# Patient Record
Sex: Male | Born: 1970 | Hispanic: Refuse to answer | Marital: Married
Health system: Midwestern US, Community
[De-identification: ages and names within clinical notes are randomized; demographics above are authoritative.]

## PROBLEM LIST (undated history)

## (undated) DIAGNOSIS — E785 Hyperlipidemia, unspecified: Secondary | ICD-10-CM

## (undated) DIAGNOSIS — F32A Depression, unspecified: Secondary | ICD-10-CM

## (undated) DIAGNOSIS — I10 Essential (primary) hypertension: Principal | ICD-10-CM

## (undated) DIAGNOSIS — E669 Obesity, unspecified: Secondary | ICD-10-CM

## (undated) DIAGNOSIS — I499 Cardiac arrhythmia, unspecified: Secondary | ICD-10-CM

## (undated) DIAGNOSIS — E119 Type 2 diabetes mellitus without complications: Secondary | ICD-10-CM

## (undated) DIAGNOSIS — E1159 Type 2 diabetes mellitus with other circulatory complications: Secondary | ICD-10-CM

## (undated) DIAGNOSIS — F419 Anxiety disorder, unspecified: Secondary | ICD-10-CM

## (undated) DIAGNOSIS — G4730 Sleep apnea, unspecified: Secondary | ICD-10-CM

## (undated) DIAGNOSIS — E1169 Type 2 diabetes mellitus with other specified complication: Secondary | ICD-10-CM

## (undated) DIAGNOSIS — N529 Male erectile dysfunction, unspecified: Secondary | ICD-10-CM

## (undated) DIAGNOSIS — M25511 Pain in right shoulder: Secondary | ICD-10-CM

## (undated) DIAGNOSIS — G8929 Other chronic pain: Secondary | ICD-10-CM

## (undated) HISTORY — DX: Male erectile dysfunction, unspecified: N52.9

## (undated) HISTORY — DX: Hyperlipidemia, unspecified: E78.5

## (undated) HISTORY — DX: Other chronic pain: G89.29

## (undated) HISTORY — DX: Type 2 diabetes mellitus with other circulatory complications: E11.59

## (undated) HISTORY — DX: Type 2 diabetes mellitus without complications: E11.9

## (undated) HISTORY — DX: Essential (primary) hypertension: I10

## (undated) HISTORY — PX: VASECTOMY: SHX75

## (undated) HISTORY — DX: Type 2 diabetes mellitus with other specified complication: E11.69

## (undated) HISTORY — DX: Obesity, unspecified: E66.9

## (undated) HISTORY — DX: Pain in right shoulder: M25.511

---
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
## 2014-06-23 DIAGNOSIS — K429 Umbilical hernia without obstruction or gangrene: Secondary | ICD-10-CM | POA: Insufficient documentation

## 2014-10-20 HISTORY — PX: HERNIA REPAIR: SHX51

## 2015-12-06 ENCOUNTER — Ambulatory Visit: Admit: 2015-12-06 | Discharge: 2015-12-06 | Payer: PRIVATE HEALTH INSURANCE | Attending: Family Medicine

## 2015-12-06 DIAGNOSIS — E785 Hyperlipidemia, unspecified: Secondary | ICD-10-CM

## 2015-12-06 MED ORDER — TADALAFIL 10 MG TABLET
10 mg | ORAL_TABLET | Freq: Every day | ORAL | 3 refills | Status: AC | PRN
Start: 2015-12-06 — End: ?

## 2015-12-06 MED ORDER — SIMVASTATIN 20 MG TAB
20 mg | ORAL_TABLET | ORAL | 3 refills | Status: AC
Start: 2015-12-06 — End: ?

## 2015-12-06 MED ORDER — METFORMIN 500 MG TAB
500 mg | ORAL_TABLET | Freq: Two times a day (BID) | ORAL | 3 refills | Status: AC
Start: 2015-12-06 — End: ?

## 2015-12-06 MED ORDER — VICTOZA 3-PAK 0.6 MG/0.1 ML (18 MG/3 ML) SUBCUTANEOUS PEN INJECTOR
0.6 mg/0.1 mL (18 mg/3 mL) | INJECTION | Freq: Every evening | SUBCUTANEOUS | 3 refills | Status: AC
Start: 2015-12-06 — End: ?

## 2015-12-06 NOTE — Patient Instructions (Signed)
Vaccine Information Statement    Influenza (Flu) Vaccine (Inactivated or Recombinant): What you need to know    Many Vaccine Information Statements are available in Spanish and other languages. See www.immunize.org/vis  Hojas de Informaci??n Sobre Vacunas est??n disponibles en Espa??ol y en muchos otros idiomas. Visite www.immunize.org/vis    1. Why get vaccinated?    Influenza (???flu???) is a contagious disease that spreads around the [place] every year, usually between October and May.     Flu is caused by influenza viruses, and is spread mainly by coughing, sneezing, and close contact.     Anyone can get flu. Flu strikes suddenly and can last several days. Symptoms vary by age, but can include:  ??? fever/chills  ??? sore throat  ??? muscle aches  ??? fatigue  ??? cough  ??? headache   ??? runny or stuffy nose    Flu can also lead to pneumonia and blood infections, and cause diarrhea and seizures in children.  If you have a medical condition, such as heart or lung disease, flu can make it worse.    Flu is more dangerous for some people. Infants and young children, people 65 years of age and older, pregnant women, and people with certain health conditions or a weakened immune system are at greatest risk.      Each year thousands of people in the [place] die from flu, and many more are hospitalized.     Flu vaccine can:  ??? keep you from getting flu,  ??? make flu less severe if you do get it, and  ??? keep you from spreading flu to your family and other people.     2. Inactivated and recombinant flu vaccines    A dose of flu vaccine is recommended every flu season. Children 6 months through 8 years of age may need two doses during the same flu season.  Everyone else needs only one dose each flu season.       Some inactivated flu vaccines contain a very small amount of a mercury-based preservative called thimerosal. Studies have not shown thimerosal in vaccines to be harmful, but flu vaccines that do not contain  thimerosal are available.    There is no live flu virus in flu shots.  They cannot cause the flu.     There are many flu viruses, and they are always changing. Each year a new flu vaccine is made to protect against three or four viruses that are likely to cause disease in the upcoming flu season. But even when the vaccine doesn???t exactly match these viruses, it may still provide some protection    Flu vaccine cannot prevent:  ??? flu that is caused by a virus not covered by the vaccine, or  ??? illnesses that look like flu but are not.    It takes about 2 weeks for protection to develop after vaccination, and protection lasts through the flu season.     3. Some people should not get this vaccine    Tell the person who is giving you the vaccine:    ??? If you have any severe, life-threatening allergies.    If you ever had a life-threatening allergic reaction after a dose of flu vaccine, or have a severe allergy to any part of this vaccine, you may be advised not to get vaccinated.  Most, but not all, types of flu vaccine contain a small amount of egg protein.       ??? If you   ever had Guillain-Barr?? Syndrome (also called GBS).   Some people with a history of GBS should not get this vaccine. This should be discussed with your doctor.    ??? If you are not feeling well.    It is usually okay to get flu vaccine when you have a mild illness, but you might be asked to come back when you feel better.      4. Risks of a vaccine reaction    With any medicine, including vaccines, there is a chance of reactions. These are usually mild and go away on their own, but serious reactions are also possible.     Most people who get a flu shot do not have any problems with it.     Minor problems following a flu shot include:   ??? soreness, redness, or swelling where the shot was given    ??? hoarseness  ??? sore, red or itchy eyes  ??? cough  ??? fever  ??? aches  ??? headache  ??? itching  ??? fatigue   If these problems occur, they usually begin soon after the shot and last 1 or 2 days.     More serious problems following a flu shot can include the following:    ??? There may be a small increased risk of Guillain-Barr?? Syndrome (GBS) after inactivated flu vaccine.  This risk has been estimated at 1 or 2 additional cases per million people vaccinated. This is much lower than the risk of severe complications from flu, which can be prevented by flu vaccine.      ??? Young children who get the flu shot along with pneumococcal vaccine (PCV13) and/or DTaP vaccine at the same time might be slightly more likely to have a seizure caused by fever. Ask your doctor for more information. Tell your doctor if a child who is getting flu vaccine has ever had a seizure.     Problems that could happen after any injected vaccine:     ??? People sometimes faint after a medical procedure, including vaccination. Sitting or lying down for about 15 minutes can help prevent fainting, and injuries caused by a fall. Tell your doctor if you feel dizzy, or have vision changes or ringing in the ears.    ??? Some people get severe pain in the shoulder and have difficulty moving the arm where a shot was given. This happens very rarely.    ??? Any medication can cause a severe allergic reaction. Such reactions from a vaccine are very rare, estimated at about 1 in a million doses, and would happen within a few minutes to a few hours after the vaccination.    As with any medicine, there is a very remote chance of a vaccine causing a serious injury or death.    The safety of vaccines is always being monitored. For more information, visit: www.cdc.gov/vaccinesafety/    5. What if there is a serious reaction?    What should I look for?    ??? Look for anything that concerns you, such as signs of a severe allergic reaction, very high fever, or unusual behavior.    Signs of a severe allergic reaction can include hives, swelling of the  face and throat, difficulty breathing, a fast heartbeat, dizziness, and weakness ??? usually within a few minutes to a few hours after the vaccination.    What should I do?    ??? If you think it is a severe allergic reaction or other emergency that   can???t wait, call 9-1-1 and get the person to the nearest hospital. Otherwise, call your doctor.    ??? Reactions should be reported to the Vaccine Adverse Event Reporting System (VAERS). Your doctor should file this report, or you can do it yourself through  the VAERS web site at www.vaers.hhs.gov, or by calling 1-800-822-7967.    VAERS does not give medical advice.    6. The National Vaccine Injury Compensation Program    The National Vaccine Injury Compensation Program (VICP) is a federal program that was created to compensate people who may have been injured by certain vaccines.    Persons who believe they may have been injured by a vaccine can learn about the program and about filing a claim by calling 1-800-338-2382 or visiting the VICP website at www.hrsa.gov/vaccinecompensation.  There is a time limit to file a claim for compensation.    7. How can I learn more?  ??? Ask your healthcare provider. He or she can give you the vaccine package insert or suggest other sources of information.  ??? Call your local or state health department.  ??? Contact the Centers for Disease Control and Prevention (CDC):  - Call 1-800-232-4636 (1-800-CDC-INFO) or  - Visit CDC???s website at www.cdc.gov/flu    Vaccine Information Statement   Inactivated Influenza Vaccine   05/26/2014  42 U.S.C[place] ?? [id number]    Department of Health and Human Services  Centers for Disease Control and Prevention    Office Use Only

## 2015-12-06 NOTE — Progress Notes (Signed)
Tyrone Thomas is a 45 y.o. male who presents for routine immunizations.   He denies any symptoms , reactions or allergies that would exclude them from being immunized today.  Risks and adverse reactions were discussed and the VIS was given to them. All questions were addressed.  He was observed for 10 min post injection. There were no reactions observed.    [name]

## 2015-12-06 NOTE — Progress Notes (Signed)
SUBJECTIVE:  Tyrone Thomas is a 45 y.o. year old male   Chief Complaint   Patient presents with   ??? Blood sugar problem   ??? Cholesterol Problem   ??? Elevated Blood Pressure       History of Present Illness:   He has been treated with Metformin for dysmetabolic syndrome for last 10 years.  He is on VICTOZA for last 2-3 years.  His last BW was 7-8 months ago and his HgbA1c was normal.  He has lost weight with current treatment.    He is also taking Cialis for ED for the last 3 years.    He has had high blood pressure in the past, which improved with weight loss.    Past Medical History   Diagnosis Date   ??? H/O: HTN (hypertension) 2007   ??? High triglycerides    ??? Hypercholesterolemia    ??? Metabolic syndrome 2012     Past Surgical History   Procedure Laterality Date   ??? Hx hernia repair  2016   ??? Hx tonsillectomy  1975        Current Outpatient Prescriptions   Medication Sig   ??? VICTOZA 3-PAK 0.6 mg/0.1 mL (18 mg/3 mL) sub-q pen 1.8 mg by SubCUTAneous route nightly.   ??? metFORMIN (GLUCOPHAGE) 500 mg tablet Take 1 Tab by mouth two (2) times a day.   ??? simvastatin (ZOCOR) 20 mg tablet TAKE 1 TABLET BY MOUTH EVERY NIGHT AT BEDTIMES   ??? CIALIS 5 mg tablet TAKE 1 TABLET BY MOUTH DAILY   ??? aspirin delayed-release 81 mg tablet Take 81 mg by mouth daily.   ??? cholecalciferol (VITAMIN D3) 1,000 unit cap Take 1,000 Units by mouth daily.   ??? OMEGA-3 FATTY ACIDS/FISH OIL (OMEGA 3 FISH OIL PO) Take 2,000 mg by mouth daily.     No current facility-administered medications for this visit.        No Known Allergies     Family History   Problem Relation Age of Onset   ??? Diabetes Mother    ??? Thyroid Disease Father    ??? No Known Problems Brother         Social History   Substance Use Topics   ??? Smoking status: Never Smoker   ??? Smokeless tobacco: Former Neurosurgeon     Quit date: 10/20/1993   ??? Alcohol use 3.0 oz/week     3 Glasses of wine, 2 Cans of beer per week       Review of Systems:      Constitutional: No fever, chills, night sweats, malaise, dizziness.   Eyes: Glasses.  No eye discomfort, discharge, redness, new visual changes.   Ear/Nose/Throat: No ear/ throat/ sinus pain, lesions, unusual discharge, new speaking or hearing problems, epistaxis.   Cardiovascular: No angina, palpitations, PND, orthopnea, lightheadedness, edema, claudication.  Respiratory: No dyspnea, wheeze, pleurisy, hemoptysis, unusual cough or sputum.   Gastrointestinal: No nausea/ vomiting, bowel habit change, pain, GER symptoms, melena, hematochezia, anorexia.  Genitourinary: Denies any comlaints  Musculoskeletal: No joint swelling/pain, instability, focal weakness, stiffness/rigidity, radicular pain.  Skin/Breast: Denies any complaints.  Neurological: No seizures, numbness, dizziness, speech abnormality, incontinence.  Psychiatric: No agitation, confusion/disorientation, suicidal or homicidal ideation.   Endocrine: Denies any other complaints.  Heme/Onc/Lympha: Denies any complaints.  Allergy/Immunol: Denies any complaints.                   OBJECTIVE:  Physical Exam:   Constitutional: General Appearance:  well developed, obese,  nontoxic, in no acute distress.   Visit Vitals   ??? BP 138/88 (BP 1 Location: Left arm, BP Patient Position: Sitting)   ??? Pulse 92   ??? Temp 98 ??F (36.7 ??C) (Oral)   ??? Resp 16   ??? Ht  (1.753 m)   ??? Wt 241 lb (109.3 kg)   ??? SpO2 97%   ??? BMI 35.59 kg/m2     Eyes: Conjunctiva: normal & Lids: normal.  Pupils & Irises: normal size; equal, round and reactive to light.      ENT/Mouth: Nose: clear.  Throat:  clear.    Neck Area: Neck: without masses, symmetric, trachea is midline.  Thyroid:  without significant enlargement, masses or tenderness.    Pulmonary: Respiratory effort: normal; no dyspnea, no retractions, no accessory muscle use.   Auscultation: normal & symmetrical air exchange; no rales, no rhonchi, no wheeze; no rubs    Cardiovascular: Palpation: PMI not displaced or enlarged, no thrills or  heaves.   Auscultation: RRR; no murmur, rubs or gallops.   Extremities: no edema, no active varicosity. Neck: carotid arteries- normal volume & without bruit; no JVD. Femoral arteries: no bruit. Pedal pulses: normal volume.    Gastrointestinal: Normal bowel sounds.  No masses; no tenderness; no rebound/rigidity; no CVA tenderness.  No hepatosplenomegaly.  Well healed umbilical hernia scar.       Skin: Inspection: no significant rashes, lesions or ulcers.      Nodes: Cervical: no significant adenopathy.  Inguinal: no significant adenopathy.    Psychiatric: Oriented to time, place and person.   Normal mood, no agitation or anxiety.  Normal affect.  Pleasant and cooperative.    Neurologic: CN I to XII: intact.  DTR: symmetrical & normal.    Musculoskeletal: NC/AT.  Neck-supple. Gait and Station: gait- normal; station- normal.  All Extremities: no heat, effusion, redness or tenderness; normal strength/tone; no instability; FROM.         ASSESSMENT:     1. Hyperlipidemia, unspecified hyperlipidemia type    2. Elevated glucose    3. Elevated blood pressure    4. Obesity (BMI 35.0-39.9 without comorbidity) (HCC)    5. Encounter for immunization        PLAN:     Orders Placed This Encounter   ??? Influenza virus vaccine (QUADRIVALENT PRES FREE SYRINGE) IM 3 years and older   ??? LIPID PANEL   ??? CBC WITH AUTOMATED DIFF   ??? METABOLIC PANEL, COMPREHENSIVE   ??? URINALYSIS W/ RFLX MICROSCOPIC   ??? TSH 3RD GENERATION   ??? T4, FREE   ??? HEMOGLOBIN A1C WITH EAG   ??? VICTOZA 3-PAK 0.6 mg/0.1 mL (18 mg/3 mL) sub-q pen   ??? metFORMIN (GLUCOPHAGE) 500 mg tablet   ??? simvastatin (ZOCOR) 20 mg tablet   ??? tadalafil (CIALIS) 10 mg tablet    To return fasting and call for results.    Pharmacologic Management: Medications reviewed with the patient.  Change CIALIS to 10 PRN instead 5 mg daily.      Other Instructions: Discussed DDx, follow-up & work-up.  Discussed risk/benefit & side effect of treatment.  Follow up visit as planned, prn  sooner.  Low salt ADA diet, weightloss & graduated excecise.  Health risk from non adherence discussed.  Patient voiced understanding.     Follow-up Disposition:  Return in about 3 months (around 03/04/2016).    Ovid Curd, MD

## 2015-12-07 ENCOUNTER — Inpatient Hospital Stay: Admit: 2015-12-07 | Payer: PRIVATE HEALTH INSURANCE

## 2015-12-07 DIAGNOSIS — E785 Hyperlipidemia, unspecified: Secondary | ICD-10-CM

## 2015-12-07 LAB — CBC WITH AUTOMATED DIFF
ABS. BASOPHILS: 0.1 10*3/uL — ABNORMAL HIGH (ref 0.0–0.06)
ABS. EOSINOPHILS: 0.1 10*3/uL (ref 0.0–0.4)
ABS. LYMPHOCYTES: 2.2 10*3/uL (ref 0.9–3.6)
ABS. MONOCYTES: 0.7 10*3/uL (ref 0.05–1.2)
ABS. NEUTROPHILS: 6.7 10*3/uL (ref 1.8–8.0)
BASOPHILS: 1 % (ref 0–2)
EOSINOPHILS: 1 % (ref 0–5)
HCT: 47.4 % (ref 36.0–48.0)
HGB: 15.7 g/dL (ref 13.0–16.0)
LYMPHOCYTES: 22 % (ref 21–52)
MCH: 31.7 PG (ref 24.0–34.0)
MCHC: 33.1 g/dL (ref 31.0–37.0)
MCV: 95.6 FL (ref 74.0–97.0)
MONOCYTES: 7 % (ref 3–10)
MPV: 11.1 FL (ref 9.2–11.8)
NEUTROPHILS: 69 % (ref 40–73)
PLATELET: 235 10*3/uL (ref 135–420)
RBC: 4.96 M/uL (ref 4.70–5.50)
RDW: 12.7 % (ref 11.6–14.5)
WBC: 9.8 10*3/uL (ref 4.6–13.2)

## 2015-12-07 LAB — URINALYSIS W/ RFLX MICROSCOPIC
Bilirubin: NEGATIVE
Blood: NEGATIVE
Glucose: NEGATIVE mg/dL
Ketone: NEGATIVE mg/dL
Leukocyte Esterase: NEGATIVE
Nitrites: NEGATIVE
Protein: NEGATIVE mg/dL
Specific gravity: 1.022 (ref 1.005–1.030)
Urobilinogen: 0.2 EU/dL (ref 0.2–1.0)
pH (UA): 6.5 (ref 5.0–8.0)

## 2015-12-07 LAB — T4, FREE: T4, Free: 1.2 NG/DL (ref 0.7–1.5)

## 2015-12-07 NOTE — Progress Notes (Signed)
Lab tests came back good.

## 2015-12-08 LAB — METABOLIC PANEL, COMPREHENSIVE
A-G Ratio: 1.4 (ref 0.8–1.7)
ALT (SGPT): 38 U/L (ref 16–61)
AST (SGOT): 22 U/L (ref 15–37)
Albumin: 3.9 g/dL (ref 3.4–5.0)
Alk. phosphatase: 62 U/L (ref 45–117)
Anion gap: 8 mmol/L (ref 3.0–18)
BUN/Creatinine ratio: 15 (ref 12–20)
BUN: 14 MG/DL (ref 7.0–18)
Bilirubin, total: 0.5 MG/DL (ref 0.2–1.0)
CO2: 28 mmol/L (ref 21–32)
Calcium: 8.8 MG/DL (ref 8.5–10.1)
Chloride: 104 mmol/L (ref 100–108)
Creatinine: 0.93 MG/DL (ref 0.6–1.3)
GFR est AA: >60 mL/min/{1.73_m2} (ref >60)
GFR est non-AA: >60 mL/min/{1.73_m2} (ref >60)
Globulin: 2.7 g/dL (ref 2.0–4.0)
Glucose: 93 mg/dL (ref 74–99)
Potassium: 4.2 mmol/L (ref 3.5–5.5)
Protein, total: 6.6 g/dL (ref 6.4–8.2)
Sodium: 140 mmol/L (ref 136–145)

## 2015-12-08 LAB — LIPID PANEL
CHOL/HDL Ratio: 2.6 (ref 0–5.0)
Cholesterol, total: 139 MG/DL (ref <200)
HDL Cholesterol: 53 MG/DL (ref 40–60)
LDL, calculated: 56.4 MG/DL (ref 0–100)
Triglyceride: 148 MG/DL (ref <150)
VLDL, calculated: 29.6 MG/DL

## 2015-12-08 LAB — HEMOGLOBIN A1C WITH EAG
Est. average glucose: 108 mg/dL
Hemoglobin A1c: 5.4 % (ref 4.2–5.6)

## 2015-12-08 LAB — TSH 3RD GENERATION: TSH: 1.06 u[IU]/mL (ref 0.36–3.74)

## 2015-12-10 NOTE — Telephone Encounter (Signed)
Called pt and left message. Call back number left and I myself or one of the other nurses will attempt to contact again.    The call was to inform pt of lab results.

## 2015-12-11 NOTE — Telephone Encounter (Signed)
Called pt and left message. Call back number left and I myself or one of the other nurses will attempt to contact again.    The call was to inform pt of lab results. Letter sent.

## 2015-12-20 NOTE — Telephone Encounter (Signed)
Lab tests came back good.    Patient notified of lab results.

## 2016-06-05 ENCOUNTER — Encounter: Attending: Family Medicine

## 2016-11-21 ENCOUNTER — Ambulatory Visit (INDEPENDENT_AMBULATORY_CARE_PROVIDER_SITE_OTHER): Payer: 59 | Admitting: Family Medicine

## 2016-11-21 ENCOUNTER — Encounter: Payer: Self-pay | Admitting: Family Medicine

## 2016-11-21 VITALS — BP 136/84 | HR 88 | Temp 97.8°F | Ht 69.0 in | Wt 237.0 lb

## 2016-11-21 DIAGNOSIS — I152 Hypertension secondary to endocrine disorders: Secondary | ICD-10-CM | POA: Insufficient documentation

## 2016-11-21 DIAGNOSIS — E669 Obesity, unspecified: Secondary | ICD-10-CM

## 2016-11-21 DIAGNOSIS — E119 Type 2 diabetes mellitus without complications: Secondary | ICD-10-CM | POA: Insufficient documentation

## 2016-11-21 DIAGNOSIS — E782 Mixed hyperlipidemia: Secondary | ICD-10-CM

## 2016-11-21 DIAGNOSIS — N529 Male erectile dysfunction, unspecified: Secondary | ICD-10-CM | POA: Insufficient documentation

## 2016-11-21 DIAGNOSIS — E1159 Type 2 diabetes mellitus with other circulatory complications: Secondary | ICD-10-CM | POA: Insufficient documentation

## 2016-11-21 DIAGNOSIS — E785 Hyperlipidemia, unspecified: Secondary | ICD-10-CM

## 2016-11-21 DIAGNOSIS — E1169 Type 2 diabetes mellitus with other specified complication: Secondary | ICD-10-CM

## 2016-11-21 DIAGNOSIS — E66812 Obesity, class 2: Secondary | ICD-10-CM

## 2016-11-21 DIAGNOSIS — Z23 Encounter for immunization: Secondary | ICD-10-CM

## 2016-11-21 DIAGNOSIS — I10 Essential (primary) hypertension: Secondary | ICD-10-CM | POA: Diagnosis not present

## 2016-11-21 HISTORY — DX: Type 2 diabetes mellitus with other circulatory complications: E11.59

## 2016-11-21 HISTORY — DX: Type 2 diabetes mellitus without complications: E11.9

## 2016-11-21 HISTORY — DX: Hypertension secondary to endocrine disorders: I15.2

## 2016-11-21 HISTORY — DX: Obesity, unspecified: E66.9

## 2016-11-21 HISTORY — DX: Male erectile dysfunction, unspecified: N52.9

## 2016-11-21 HISTORY — DX: Type 2 diabetes mellitus with other specified complication: E11.69

## 2016-11-21 HISTORY — DX: Obesity, class 2: E66.812

## 2016-11-21 MED ORDER — LISINOPRIL 10 MG PO TABS: 10.0000 mg | ORAL_TABLET | Freq: Every day | ORAL | 4 refills | Status: DC

## 2016-11-21 MED ORDER — LIRAGLUTIDE 18 MG/3ML ~~LOC~~ SOPN: 1.2000 mg | PEN_INJECTOR | Freq: Every day | SUBCUTANEOUS | 4 refills | Status: DC

## 2016-11-21 MED ORDER — SIMVASTATIN 20 MG PO TABS: 20.0000 mg | ORAL_TABLET | Freq: Every day | ORAL | 4 refills | Status: DC

## 2016-11-21 MED ORDER — CIALIS 10 MG PO TABS: 5.0000 mg | ORAL_TABLET | Freq: Every day | ORAL | 2 refills | Status: DC | PRN

## 2016-11-21 MED ORDER — METFORMIN HCL 500 MG PO TABS: 500.0000 mg | ORAL_TABLET | Freq: Two times a day (BID) | ORAL | 4 refills | Status: DC

## 2016-11-21 NOTE — Progress Notes (Signed)
Pre visit review using our clinic review tool, if applicable. No additional management support is needed unless otherwise documented below in the visit note. 

## 2016-11-22 NOTE — Progress Notes (Signed)
Gary Berry is a 46 y.o. male is here to [place].   History of Present Illness:    1. Type 2 diabetes mellitus without complication, without long-term current use of insulin [facility]): Doing well on current regimen. A1c not checked in > 6 months. UTD on eye and foot exams. Does not regularly check BG. Tries to follow low carb diet.    2. HTN, goal below 130/80: Previously on Lisinopril but stopped since his BP normalized. No CP, SOB, Ha, dizziness, edema.    3. Mixed hyperlipidemia: On statin. No myalgias.   4. ED (erectile dysfunction) of organic origin: Needs refill.    5. Encounter for immunization: Wants flu vax.    6. Obesity, Class II, BMI 35-39.9: Has lost 50 pounds since highest weight. Wife is new medical weight loss physician with [employer].     PMHx, SurgHx, SocialHx, Medications, and Allergies were reviewed in the Visit Navigator and updated as appropriate.    Past Medical History:  Diagnosis Date  . Diabetes mellitus without complication ([place])   . High cholesterol   . Hypertension     Past Surgical History:  Procedure Laterality Date  . HERNIA REPAIR  2016  . TONSILLECTOMY  1975    Family History  Problem Relation Age of Onset  . Diabetes Mother   . Hypertension Mother   . Atrial fibrillation Father     Social History  Substance Use Topics  . Smoking status: Never Smoker  . Smokeless tobacco: Former Systems developer    Types: Chew    Quit date: 11/21/1997  . Alcohol use Yes     Comment: social     Current Medications and Allergies:    Current Outpatient Prescriptions:  .  aspirin 81 MG chewable tablet, Chew by mouth daily., Disp: , Rfl:  .  CIALIS 10 MG tablet, Take 0.5 tablets (5 mg total) by mouth daily as needed for erectile dysfunction., Disp: 10 tablet, Rfl: 2 .  metFORMIN (GLUCOPHAGE) 500 MG tablet, Take 1 tablet (500 mg total) by mouth 2 (two) times daily at 8 am and 10 pm., Disp: 180 tablet, Rfl: 4 .  Omega-3 Fatty Acids (FISH OIL CONCENTRATE PO),  Take by mouth., Disp: , Rfl:  .  simvastatin (ZOCOR) 20 MG tablet, Take 1 tablet (20 mg total) by mouth daily at 6 PM., Disp: 90 tablet, Rfl: 4 .  liraglutide 18 MG/3ML SOPN, Inject 0.2 mLs (1.2 mg total) into the skin daily. 1.2 MG [place] Qd. MAX 1.8 MG/DAY, Disp: 2 pen, Rfl: 4  No Known Allergies    Patient Information Form: Screening and ROS    Do you feel safe in relationships? yes PHQ-2: negative  Review of Systems  General:  Negative for nexplained weight loss, fever Skin: Negative for new or changing mole, sore that won't heal HEENT: Negative for trouble hearing, trouble seeing, ringing in ears, mouth sores, hoarseness, change in voice, dysphagia CV:  Negative for chest pain, dyspnea, edema, palpitations Resp: Negative for cough, dyspnea, hemoptysis GI: Negative for nausea, vomiting, diarrhea, constipation, abdominal pain, melena, hematochezia GU: Negative for dysuria, incontinence, urinary hesitance, hematuria, penile discharge, polyuria, sexual difficulty, lumps in testicle MSK: Negative for muscle cramps or aches, joint pain or swelling Neuro: Negative for headaches, weakness, numbness, dizziness, passing out/fainting Psych: Negative for depression, anxiety, memory problems   Vitals:   Vitals:   11/21/16 1253  BP: 136/84  Pulse: 88  Temp: 97.8 F (36.6 C)  TempSrc: Oral  SpO2: 98%  Weight:  237 lb (107.5 kg)  Height: 5\' 9"  (1.753 m)     Body mass index is 35 kg/m.   Physical Exam:     General: Alert, cooperative, appears stated age and no distress.  HEENT:  Normocephalic, without obvious abnormality, atraumatic. Conjunctivae/corneas clear. PERRL, EOM's intact. Normal TM's and external ear canals both ears. Nares normal. Septum midline. Mucosa normal. No drainage or sinus tenderness. Lips, mucosa, and tongue normal; teeth and gums normal.  Lungs: Clear to auscultation bilaterally.  Heart:: Regular rate and rhythm, S1, S2 normal, no murmur, click, rub or gallop.    Abdomen: Soft, non-tender; bowel sounds normal; no masses,  no organomegaly.  Extremities: Extremities normal, atraumatic, no cyanosis or edema.  Pulses: 2+ and symmetric.  Skin: Skin color, texture, turgor normal. No rashes or lesions.  Neurologic: Alert and oriented X 3, normal strength and tone. Normal symmetric. reflexes. Normal coordination and gait.  Psych: Alert,oriented, in NAD with a full range of affect, normal behavior and no psychotic features     Assessment and Plan:    Gary Berry was seen today for establish care.  Diagnoses and all orders for this visit:  Type 2 diabetes mellitus without complication, without long-term current use of insulin (HCC) -     metFORMIN (GLUCOPHAGE) 500 MG tablet; Take 1 tablet (500 mg total) by mouth 2 (two) times daily at 8 am and 10 pm. -     liraglutide 18 MG/3ML SOPN; Inject 0.2 mLs (1.2 mg total) into the skin daily. 1.2 MG Gary Berry Qd. MAX 1.8 MG/DAY -     Comprehensive metabolic panel; Future -     Hemoglobin A1c; Future  HTN, goal below 130/80 -     lisinopril (PRINIVIL,ZESTRIL) 10 MG tablet; Take 1 tablet (10 mg total) by mouth daily.  Mixed hyperlipidemia -     simvastatin (ZOCOR) 20 MG tablet; Take 1 tablet (20 mg total) by mouth daily at 6 PM. -     Lipid panel; Future  ED (erectile dysfunction) of organic origin -     CIALIS 10 MG tablet; Take 0.5 tablets (5 mg total) by mouth daily as needed for erectile dysfunction.  Encounter for immunization -     Flu Vaccine QUAD 36+ mos IM  Obesity, Class II, BMI 35-39.9   . Reviewed expectations re: course of current medical issues. . Discussed self-management of symptoms. . Outlined signs and symptoms indicating need for more acute intervention. . Patient verbalized understanding and all questions were answered. . See orders for this visit as documented in the electronic medical record. . Patient received an After Visit Summary.  Records requested if needed. I spent 45 minutes with  this patient, greater than 50% was face-to-face time counseling regarding the above diagnoses.    Briscoe Deutscher, [place], [place] 11/22/2016   Follow-up: Return in about 6 months (around 05/21/2017).  Meds ordered this encounter  Medications  . DISCONTD: VICTOZA 18 MG/3ML SOPN  . DISCONTD: metFORMIN (GLUCOPHAGE) 500 MG tablet    Sig: 500 mg 2 (two) times daily at 8 am and 10 pm.  . DISCONTD: simvastatin (ZOCOR) 20 MG tablet  . DISCONTD: CIALIS 10 MG tablet    Sig: 5 mg daily.  . Omega-3 Fatty Acids (FISH OIL CONCENTRATE PO)    Sig: Take by mouth.  [place] Kitchen aspirin 81 MG chewable tablet    Sig: Chew by mouth daily.  Marland Kitchen lisinopril (PRINIVIL,ZESTRIL) 10 MG tablet    Sig: Take 1 tablet (10 mg total)  by mouth daily.    Dispense:  90 tablet    Refill:  4  . CIALIS 10 MG tablet    Sig: Take 0.5 tablets (5 mg total) by mouth daily as needed for erectile dysfunction.    Dispense:  10 tablet    Refill:  2  . metFORMIN (GLUCOPHAGE) 500 MG tablet    Sig: Take 1 tablet (500 mg total) by mouth 2 (two) times daily at 8 am and 10 pm.    Dispense:  180 tablet    Refill:  4  . simvastatin (ZOCOR) 20 MG tablet    Sig: Take 1 tablet (20 mg total) by mouth daily at 6 PM.    Dispense:  90 tablet    Refill:  4  . liraglutide 18 MG/3ML SOPN    Sig: Inject 0.2 mLs (1.2 mg total) into the skin daily. 1.2 MG [place] Qd. MAX 1.8 MG/DAY    Dispense:  2 pen    Refill:  4   Medications Discontinued During This Encounter  Medication Reason  . VICTOZA 18 MG/3ML SOPN   . CIALIS 10 MG tablet Reorder  . metFORMIN (GLUCOPHAGE) 500 MG tablet Reorder  . simvastatin (ZOCOR) 20 MG tablet Reorder   Orders Placed This Encounter  Procedures  . Flu Vaccine QUAD 36+ mos IM  . Comprehensive metabolic panel  . Hemoglobin A1c  . Lipid panel

## 2016-11-24 ENCOUNTER — Other Ambulatory Visit: Payer: 59

## 2016-11-26 ENCOUNTER — Other Ambulatory Visit (INDEPENDENT_AMBULATORY_CARE_PROVIDER_SITE_OTHER): Payer: 59

## 2016-11-26 DIAGNOSIS — E119 Type 2 diabetes mellitus without complications: Secondary | ICD-10-CM

## 2016-11-26 DIAGNOSIS — E782 Mixed hyperlipidemia: Secondary | ICD-10-CM

## 2016-11-26 LAB — COMPREHENSIVE METABOLIC PANEL
ALT: 22 U/L (ref 0–53)
AST: 21 U/L (ref 0–37)
Albumin: 4.6 g/dL (ref 3.5–5.2)
Alkaline Phosphatase: 56 U/L (ref 39–117)
BUN: 18 mg/dL (ref 6–23)
CO2: 27 mEq/L (ref 19–32)
Calcium: 9.5 mg/dL (ref 8.4–10.5)
Chloride: 106 mEq/L (ref 96–112)
Creatinine, Ser: 0.96 mg/dL (ref 0.40–1.50)
GFR: 88.85 mL/min (ref >60.00)
Glucose, Bld: 101 mg/dL — ABNORMAL HIGH (ref 70–99)
Potassium: 4.3 mEq/L (ref 3.5–5.1)
Sodium: 139 mEq/L (ref 135–145)
Total Bilirubin: 0.6 mg/dL (ref 0.2–1.2)
Total Protein: 7.1 g/dL (ref 6.0–8.3)

## 2016-11-26 LAB — LIPID PANEL
Cholesterol: 123 mg/dL (ref 0–200)
HDL: 41.9 mg/dL (ref >39.00)
LDL Cholesterol: 57 mg/dL (ref 0–99)
NonHDL: 80.82
Total CHOL/HDL Ratio: 3
Triglycerides: 120 mg/dL (ref 0.0–149.0)
VLDL: 24 mg/dL (ref 0.0–40.0)

## 2016-11-26 LAB — HEMOGLOBIN A1C: Hgb A1c MFr Bld: 5.5 % (ref 4.6–6.5)

## 2017-01-16 ENCOUNTER — Encounter: Payer: Self-pay | Admitting: Family Medicine

## 2017-01-19 MED ORDER — LIRAGLUTIDE 18 MG/3ML ~~LOC~~ SOPN: 1.8000 mg | PEN_INJECTOR | Freq: Every day | SUBCUTANEOUS | 11 refills | Status: DC

## 2017-02-11 ENCOUNTER — Other Ambulatory Visit: Payer: Self-pay

## 2017-02-11 ENCOUNTER — Encounter: Payer: Self-pay | Admitting: Family Medicine

## 2017-02-11 MED ORDER — LIRAGLUTIDE 18 MG/3ML ~~LOC~~ SOPN: 1.8000 mg | PEN_INJECTOR | Freq: Every day | SUBCUTANEOUS | 11 refills | Status: DC

## 2017-03-20 ENCOUNTER — Encounter: Payer: Self-pay | Admitting: Family Medicine

## 2017-03-20 ENCOUNTER — Ambulatory Visit (INDEPENDENT_AMBULATORY_CARE_PROVIDER_SITE_OTHER): Payer: 59 | Admitting: Family Medicine

## 2017-03-20 VITALS — BP 128/78 | HR 84 | Temp 98.2°F | Ht 69.0 in | Wt 235.2 lb

## 2017-03-20 DIAGNOSIS — M25512 Pain in left shoulder: Secondary | ICD-10-CM | POA: Diagnosis not present

## 2017-03-20 DIAGNOSIS — M25511 Pain in right shoulder: Secondary | ICD-10-CM

## 2017-03-20 DIAGNOSIS — G8929 Other chronic pain: Secondary | ICD-10-CM | POA: Diagnosis not present

## 2017-03-20 HISTORY — DX: Other chronic pain: G89.29

## 2017-03-20 NOTE — Progress Notes (Signed)
Gary Berry is a 46 y.o. male here for an acute visit.  History of Present Illness:   Water quality scientist, CMA, acting as scribe for Dr. Juleen China.  Shoulder Pain   The pain is present in the left shoulder. This is a new problem. The current episode started more than 1 month ago. There has been no history of extremity trauma. The quality of the pain is described as aching. The pain is at a severity of 2/10. The pain is mild. Associated symptoms include a limited range of motion and stiffness. Pertinent negatives include no fever. The symptoms are aggravated by activity. He has tried nothing for the symptoms.   PMHx, SurgHx, SocialHx, Medications, and Allergies were reviewed in the Visit Navigator and updated as appropriate.  Current Medications:   .  aspirin 81 MG chewable tablet, Chew by mouth daily., Disp: , Rfl:  .  CIALIS 10 MG tablet, Take 0.5 tablets (5 mg total) by mouth daily as needed for erectile dysfunction., Disp: 10 tablet, Rfl: 2 .  liraglutide 18 MG/3ML SOPN, Inject 0.3 mLs (1.8 mg total) into the skin daily., Disp: 3 pen, Rfl: 11 .  lisinopril (PRINIVIL,ZESTRIL) 10 MG tablet, Take 1 tablet (10 mg total) by mouth daily., Disp: 90 tablet, Rfl: 4 .  metFORMIN (GLUCOPHAGE) 500 MG tablet, Take 1 tablet (500 mg total) by mouth 2 (two) times daily at 8 am and 10 pm., Disp: 180 tablet, Rfl: 4 .  Omega-3 Fatty Acids (FISH OIL CONCENTRATE PO), Take by mouth., Disp: , Rfl:  .  simvastatin (ZOCOR) 20 MG tablet, Take 1 tablet (20 mg total) by mouth daily at 6 PM., Disp: 90 tablet, Rfl: 4   No Known Allergies   Review of Systems:   Review of Systems  Constitutional: Negative for chills, fever, malaise/fatigue and weight loss.  Respiratory: Negative for cough, shortness of breath and wheezing.   Cardiovascular: Negative for chest pain, palpitations and leg swelling.  Gastrointestinal: Negative for abdominal pain, constipation, diarrhea, nausea and vomiting.  Genitourinary: Negative for  dysuria and urgency.  Musculoskeletal: Positive for stiffness. Negative for joint pain and myalgias.  Skin: Negative for rash.  Neurological: Negative for dizziness and headaches.  Psychiatric/Behavioral: Negative for depression, substance abuse and suicidal ideas. The patient is not nervous/anxious.     Vitals:   Vitals:   03/20/17 0908  BP: 128/78  Pulse: 84  Temp: 98.2 F (36.8 C)  TempSrc: Oral  SpO2: 96%  Weight: 235 lb 3.2 oz (106.7 kg)  Height: 5\' 9"  (1.753 m)     Body mass index is 34.73 kg/m.  Physical Exam:   Physical Exam  Constitutional: He is oriented to person, place, and time. He appears well-developed and well-nourished. No distress.  HENT:  Head: Normocephalic and atraumatic.  Right Ear: External ear normal.  Left Ear: External ear normal.  Nose: Nose normal.  Mouth/Throat: Oropharynx is clear and moist.  Eyes: Conjunctivae and EOM are normal. Pupils are equal, round, and reactive to light.  Neck: Normal range of motion. Neck supple.  Cardiovascular: Normal rate, regular rhythm, normal heart sounds and intact distal pulses.   Pulmonary/Chest: Effort normal and breath sounds normal.  Abdominal: Soft. Bowel sounds are normal.  Musculoskeletal:       Left shoulder: He exhibits decreased range of motion and tenderness. He exhibits no effusion, no crepitus, no deformity and normal strength.  Neurological: He is alert and oriented to person, place, and time.  Skin: Skin is warm and dry.  Psychiatric: He has a normal mood and affect. His behavior is normal. Judgment and thought content normal.  Nursing note and vitals reviewed.   Assessment and Plan:   [name] was seen today for acute visit and shoulder pain.  Diagnoses and all orders for this visit:  Chronic left shoulder pain Comments: Dr. Paulla Fore evaluated and injected shoulder today. Exercises were provided. Patient can follow up with sports medicine as needed.   . Reviewed expectations re: course of  current medical issues. . Discussed self-management of symptoms. . Outlined signs and symptoms indicating need for more acute intervention. . Patient verbalized understanding and all questions were answered. Marland Kitchen Health Maintenance issues including appropriate healthy diet, exercise, and smoking avoidance were discussed with patient. . See orders for this visit as documented in the electronic medical record. . Patient received an After Visit Summary.  CMA served as Education administrator during this visit. History, Physical, and Plan performed by medical provider. The above documentation has been reviewed and is accurate and complete. Briscoe Deutscher, D.O.  Briscoe Deutscher, DO [place], [place] 03/20/2017  Future Appointments Date Time Provider [place]  05/29/2017 10:45 AM Briscoe Deutscher, DO [facility] None

## 2017-03-23 NOTE — Progress Notes (Signed)
Patient's left shoulder injected today at the request of Dr. Juleen China.  Independent exam did reveal positive impingement signs.  PROCEDURE NOTE: Left Subacromial Injection  DESCRIPTION OF PROCEDURE:  The patient's clinical condition is marked by substantial pain and/or significant functional disability. Other conservative therapy has not provided relief, is contraindicated, or not appropriate. There is a reasonable likelihood that injection will significantly improve the patient's pain and/or functional impairment. After discussing the risks, benefits and expected outcomes of the injection and all questions were reviewed and answered, the patient wished to undergo the above named procedure. Verbal consent was obtained. The target structure was injected under direct visualization using sterile technique as below: PREP: Alcohol, Ethel Chloride APPROACH: Posterior, 21g 2" needle INJECTATE: 3 cc 1% lidocaine, 2cc 40mg  DepoMedrol DRESSING: Band-Aid  Post procedural instructions including recommending icing and warning signs for infection were reviewed. This procedure was well tolerated and there were no complications.

## 2017-05-22 ENCOUNTER — Ambulatory Visit: Payer: 59 | Admitting: Family Medicine

## 2017-05-25 ENCOUNTER — Ambulatory Visit (INDEPENDENT_AMBULATORY_CARE_PROVIDER_SITE_OTHER): Payer: 59 | Admitting: Family Medicine

## 2017-05-25 ENCOUNTER — Encounter: Payer: Self-pay | Admitting: Family Medicine

## 2017-05-25 VITALS — HR 78 | Temp 98.4°F | Ht 69.0 in | Wt 233.8 lb

## 2017-05-25 DIAGNOSIS — E782 Mixed hyperlipidemia: Secondary | ICD-10-CM | POA: Diagnosis not present

## 2017-05-25 DIAGNOSIS — E119 Type 2 diabetes mellitus without complications: Secondary | ICD-10-CM

## 2017-05-25 DIAGNOSIS — Z23 Encounter for immunization: Secondary | ICD-10-CM

## 2017-05-25 DIAGNOSIS — I10 Essential (primary) hypertension: Secondary | ICD-10-CM | POA: Diagnosis not present

## 2017-05-25 DIAGNOSIS — N529 Male erectile dysfunction, unspecified: Secondary | ICD-10-CM | POA: Diagnosis not present

## 2017-05-25 LAB — POCT GLYCOSYLATED HEMOGLOBIN (HGB A1C): Hemoglobin A1C: 5.3

## 2017-05-25 MED ORDER — SIMVASTATIN 20 MG PO TABS: 20.0000 mg | ORAL_TABLET | Freq: Every day | ORAL | 4 refills | Status: DC

## 2017-05-25 MED ORDER — LISINOPRIL 10 MG PO TABS: 10.0000 mg | ORAL_TABLET | Freq: Every day | ORAL | 4 refills | Status: DC

## 2017-05-25 MED ORDER — CIALIS 10 MG PO TABS: 5.0000 mg | ORAL_TABLET | Freq: Every day | ORAL | 2 refills | Status: DC | PRN

## 2017-05-25 MED ORDER — LIRAGLUTIDE 18 MG/3ML ~~LOC~~ SOPN: 1.8000 mg | PEN_INJECTOR | Freq: Every day | SUBCUTANEOUS | 11 refills | Status: DC

## 2017-05-25 MED ORDER — METFORMIN HCL 500 MG PO TABS: 500.0000 mg | ORAL_TABLET | Freq: Two times a day (BID) | ORAL | 4 refills | Status: DC

## 2017-05-25 NOTE — Patient Instructions (Addendum)
Lab Results  Component Value Date   HGBA1C 5.3 05/25/2017   [facility] [place], [place] [address] [phone] Dr. Prudencio Burly is my favorite.

## 2017-05-25 NOTE — Progress Notes (Signed)
Gary Berry is a 46 y.o. male is here for follow up.  History of Present Illness:   Shaune Pascal CMA acting as scribe for Dr. Juleen China.  HPI: Patient comes in today for a follow up. He is following up on his diabetes, cholesterol, and blood pressure. He is tolerating all medications well.   1. Type 2 diabetes mellitus without complication, without long-term current use of insulin [facility]): Doing well on current regimen. Due for eye and foot exams.    2. HTN, goal below 130/80: Previously on Lisinopril but stopped since his BP normalized. No CP, SOB, Ha, dizziness, edema.    3. Mixed hyperlipidemia: On statin. No myalgias.   4. ED (erectile dysfunction) of organic origin: Needs refill.    5. Encounter for immunization: Wants pneumovax.   6. Obesity, Class II, BMI 35-39.9: Has lost 50 pounds since highest weight. Wife is new medical weight loss physician with [employer].   Health Maintenance Due  Topic Date Due  . PNEUMOCOCCAL POLYSACCHARIDE VACCINE (1) 11/25/1970  . FOOT EXAM  11/25/1978  . OPHTHALMOLOGY EXAM  11/25/1978  . INFLUENZA VACCINE  05/20/2017   Depression screen PHQ 2/9 05/25/2017  Decreased Interest 0  Down, Depressed, Hopeless 0  PHQ - 2 Score 0   PMHx, SurgHx, SocialHx, FamHx, Medications, and Allergies were reviewed in the Visit Navigator and updated as appropriate.   Patient Active Problem List   Diagnosis Date Noted  . Chronic right shoulder pain 03/20/2017  . Type 2 diabetes mellitus without complication, without long-term current use of insulin ([place]) 11/21/2016  . HTN, goal below 130/80 11/21/2016  . Mixed hyperlipidemia 11/21/2016  . 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 Obesity, Class II, BMI 35-39.9 11/21/2016   Social History  Substance Use Topics  . Smoking status: Never Smoker  . Smokeless tobacco: Former Systems developer    Types: Chew    Quit date: 11/21/1997  . Alcohol use Yes     Comment: social   Current Medications and Allergies:     .  aspirin 81 MG chewable tablet, Chew by mouth daily., Disp: , Rfl:  .  CIALIS 10 MG tablet, Take 0.5 tablets (5 mg total) by mouth daily as needed for erectile dysfunction., Disp: 10 tablet, Rfl: 2 .  liraglutide 18 MG/3ML SOPN, Inject 0.3 mLs (1.8 mg total) into the skin daily., Disp: 3 pen, Rfl: 11 .  lisinopril (PRINIVIL,ZESTRIL) 10 MG tablet, Take 1 tablet (10 mg total) by mouth daily., Disp: 90 tablet, Rfl: 4 .  metFORMIN (GLUCOPHAGE) 500 MG tablet, Take 1 tablet (500 mg total) by mouth 2 (two) times daily at 8 am and 10 pm., Disp: 180 tablet, Rfl: 4 .  Omega-3 Fatty Acids (FISH OIL CONCENTRATE PO), Take by mouth., Disp: , Rfl:  .  simvastatin (ZOCOR) 20 MG tablet, Take 1 tablet (20 mg total) by mouth daily at 6 PM., Disp: 90 tablet, Rfl: 4  No Known Allergies   Review of Systems   Pertinent items are noted in the HPI. Otherwise, ROS is negative.  Vitals:   Vitals:   05/25/17 0828  Pulse: 78  Temp: 98.4 F (36.9 C)  TempSrc: Oral  SpO2: 96%  Weight: 233 lb 12.8 oz (106.1 kg)  Height: 5\' 9"  (1.753 m)     Body mass index is 34.53 kg/m.  Physical Exam:   Physical Exam  Constitutional: He is oriented to person, place, and time. He appears well-developed and well-nourished. No distress.  HENT:  Head:  Normocephalic and atraumatic.  Right Ear: External ear normal.  Left Ear: External ear normal.  Nose: Nose normal.  Mouth/Throat: Oropharynx is clear and moist.  Eyes: Pupils are equal, round, and reactive to light. Conjunctivae and EOM are normal.  Neck: Normal range of motion. Neck supple.  Cardiovascular: Normal rate, regular rhythm, normal heart sounds and intact distal pulses.   Pulmonary/Chest: Effort normal and breath sounds normal.  Abdominal: Soft. Bowel sounds are normal.  Musculoskeletal: Normal range of motion.  Neurological: He is alert and oriented to person, place, and time.  Skin: Skin is warm and dry.  Psychiatric: He has a normal mood and affect. His  behavior is normal. Judgment and thought content normal.  Nursing note and vitals reviewed.   Diabetic Foot Exam - Simple   Simple Foot Form Diabetic Foot exam was performed with the following findings:  Yes 05/25/2017  8:34 AM  Visual Inspection No deformities, no ulcerations, no other skin breakdown bilaterally:  Yes Sensation Testing Intact to touch and monofilament testing bilaterally:  Yes Pulse Check Posterior Tibialis and Dorsalis pulse intact bilaterally:  Yes Comments     Results for orders placed or performed in visit on 05/25/17  POCT glycosylated hemoglobin (Hb A1C)  Result Value Ref Range   Hemoglobin A1C 5.3    Assessment and Plan:   Diagnoses and all orders for this visit:  HTN, goal below 130/80 -     lisinopril (PRINIVIL,ZESTRIL) 10 MG tablet; Take 1 tablet (10 mg total) by mouth daily.  Mixed hyperlipidemia -     simvastatin (ZOCOR) 20 MG tablet; Take 1 tablet (20 mg total) by mouth daily at 6 PM.  Type 2 diabetes mellitus without complication, without long-term current use of insulin (HCC) -     liraglutide 18 MG/3ML SOPN; Inject 0.3 mLs (1.8 mg total) into the skin daily. -     metFORMIN (GLUCOPHAGE) 500 MG tablet; Take 1 tablet (500 mg total) by mouth 2 (two) times daily at 8 am and 10 pm. -     POCT glycosylated hemoglobin (Hb A1C) -     Pneumococcal polysaccharide vaccine 23-valent greater than or equal to 2yo subcutaneous/IM; Future -     Pneumococcal polysaccharide vaccine 23-valent greater than or equal to 2yo subcutaneous/IM  ED (erectile dysfunction) of organic origin -     CIALIS 10 MG tablet; Take 0.5 tablets (5 mg total) by mouth daily as needed for erectile dysfunction.  . Reviewed expectations re: course of current medical issues. . Discussed self-management of symptoms. . Outlined signs and symptoms indicating need for more acute intervention. . Patient verbalized understanding and all questions were answered. Marland Kitchen Health Maintenance issues  including appropriate healthy diet, exercise, and smoking avoidance were discussed with patient. . See orders for this visit as documented in the electronic medical record. . Patient received an After Visit Summary.  CMA served as Education administrator during this visit. History, Physical, and Plan performed by medical provider. The above documentation has been reviewed and is accurate and complete. Briscoe Deutscher, D.O.  Briscoe Deutscher, DO [place], [place] 05/25/2017  Future Appointments Date Time Provider [place]  11/27/2017 8:15 AM Briscoe Deutscher, DO [facility] None

## 2017-05-29 ENCOUNTER — Ambulatory Visit: Payer: 59 | Admitting: Family Medicine

## 2017-06-21 ENCOUNTER — Encounter: Payer: Self-pay | Admitting: Family Medicine

## 2017-06-23 ENCOUNTER — Other Ambulatory Visit: Payer: Self-pay

## 2017-06-23 MED ORDER — PEN NEEDLES 31G X 6 MM MISC: 1.0000 [IU] | Freq: Every day | 3 refills | Status: DC

## 2017-08-01 DIAGNOSIS — Z23 Encounter for immunization: Secondary | ICD-10-CM | POA: Diagnosis not present

## 2017-08-13 DIAGNOSIS — E119 Type 2 diabetes mellitus without complications: Secondary | ICD-10-CM | POA: Diagnosis not present

## 2017-11-26 NOTE — Progress Notes (Signed)
Gary Berry is a 47 y.o. male is here for follow up.  History of Present Illness:   HPI: HPI: Patient comes in today for a follow up. He is following up on his diabetes, cholesterol, and blood pressure. He is tolerating all medications well.   1. Type 2 diabetes mellitus without complication, without long-term current use of insulin [facility]): Doing well on current regimen. Due for eye and foot exams.  Current symptoms: no polyuria or polydipsia, no chest pain, dyspnea or TIA's, no numbness, tingling or pain in extremities.  Taking medication compliantly without noted sided effects []   YES  [x]   NO  Episodes of hypoglycemia? []   YES  [x]   NO Maintaining a diabetic diet? []   YES  [x]   NO Trying to exercise on a regular basis? [x]   YES  []   NO  On ACE inhibitor or angiotensin II receptor blocker? [x]   YES  []   NO On Aspirin? [x]   YES  []   NO  Lab Results  Component Value Date   HGBA1C 5.3 05/25/2017    No results found for: Derl Barrow  Lab Results  Component Value Date   CHOL 123 11/26/2016   HDL 41.90 11/26/2016   LDLCALC 57 11/26/2016   TRIG 120.0 11/26/2016   CHOLHDL 3 11/26/2016     Wt Readings from Last 3 Encounters:  11/27/17 233 lb (105.7 kg)  05/25/17 233 lb 12.8 oz (106.1 kg)  03/20/17 235 lb 3.2 oz (106.7 kg)   BP Readings from Last 3 Encounters:  11/27/17 130/76  03/20/17 128/78  11/21/16 136/84   Lab Results  Component Value Date   CREATININE 0.96 11/26/2016    2. HTN, goal below 130/80: Previously on Lisinopril but stopped since his BP normalized. No CP, SOB, Ha, dizziness, edema.    3. Mixed hyperlipidemia: On statin. No myalgias.   4. ED (erectile dysfunction) of organic origin: Needs refill - wants to change to 5 mg, # 30.   5. Obesity, Class II, BMI 35-39.9: Wife is medical weight loss physician with [employer].   Health Maintenance Due  Topic Date Due  . OPHTHALMOLOGY EXAM  11/25/1978  . HEMOGLOBIN A1C  11/25/2017   Depression screen  PHQ 2/9 11/27/2017 05/25/2017  Decreased Interest 0 0  Down, Depressed, Hopeless 0 0  PHQ - 2 Score 0 0  Altered sleeping 0 -  Tired, decreased energy 0 -  Change in appetite 0 -  Feeling bad or failure about yourself  0 -  Trouble concentrating 0 -  Moving slowly or fidgety/restless 0 -  Suicidal thoughts 0 -  PHQ-9 Score 0 -   PMHx, SurgHx, SocialHx, FamHx, Medications, and Allergies were reviewed in the Visit Navigator and updated as appropriate.   Patient Active Problem List   Diagnosis Date Noted  . Chronic right shoulder pain 03/20/2017  . Type 2 diabetes mellitus without complication, without long-term current use of insulin ([place]) 11/21/2016  . Hypertension associated with diabetes ([place]) 11/21/2016  . Hyperlipidemia associated with type 2 diabetes mellitus ([place]) 11/21/2016  . 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 Obesity, Class II, BMI 35-39.9 11/21/2016   Social History   Tobacco Use  . Smoking status: Never Smoker  . Smokeless tobacco: Former Systems developer    Types: Chew  Substance Use Topics  . Alcohol use: Yes    Comment: social  . Drug use: No   Current Medications and Allergies:   .  aspirin 81 MG chewable tablet, Chew  by mouth daily., Disp: , Rfl:  .  CIALIS 10 MG tablet, Take 0.5 tablets (5 mg total) by mouth daily as needed for erectile dysfunction., Disp: 10 tablet, Rfl: 2 .  Insulin Pen Needle (PEN NEEDLES) 31G X 6 MM MISC, Inject 1 Units into the skin daily., Disp: 90 each, Rfl: 3 .  liraglutide 18 MG/3ML SOPN, Inject 0.3 mLs (1.8 mg total) into the skin daily., Disp: 3 pen, Rfl: 11 .  lisinopril (PRINIVIL,ZESTRIL) 10 MG tablet, Take 1 tablet (10 mg total) by mouth daily., Disp: 90 tablet, Rfl: 4 .  metFORMIN (GLUCOPHAGE) 500 MG tablet, Take 1 tablet (500 mg total) by mouth 2 (two) times daily at 8 am and 10 pm., Disp: 180 tablet, Rfl: 4 .  Omega-3 Fatty Acids (FISH OIL CONCENTRATE PO), Take by mouth., Disp: , Rfl:  .  simvastatin (ZOCOR) 20 MG  tablet, Take 1 tablet (20 mg total) by mouth daily at 6 PM., Disp: 90 tablet, Rfl: 4  No Known Allergies   Review of Systems   Pertinent items are noted in the HPI. Otherwise, ROS is negative.  Vitals:   Vitals:   11/27/17 0825  BP: 130/76  Pulse: 71  Temp: 98 F (36.7 C)  TempSrc: Oral  SpO2: 95%  Weight: 233 lb (105.7 kg)     Body mass index is 34.41 kg/m.   Physical Exam:   Physical Exam  Constitutional: He is oriented to person, place, and time. He appears well-developed and well-nourished. No distress.  HENT:  Head: Normocephalic and atraumatic.  Right Ear: External ear normal.  Left Ear: External ear normal.  Nose: Nose normal.  Mouth/Throat: Oropharynx is clear and moist.  Eyes: Conjunctivae and EOM are normal. Pupils are equal, round, and reactive to light.  Neck: Normal range of motion. Neck supple.  Cardiovascular: Normal rate, regular rhythm, normal heart sounds and intact distal pulses.  Pulmonary/Chest: Effort normal and breath sounds normal.  Abdominal: Soft. Bowel sounds are normal.  Musculoskeletal: Normal range of motion.  Neurological: He is alert and oriented to person, place, and time.  Skin: Skin is warm and dry.  Psychiatric: He has a normal mood and affect. His behavior is normal. Judgment and thought content normal.  Nursing note and vitals reviewed.   Assessment and Plan:   Gary Berry was seen today for follow-up.  Diagnoses and all orders for this visit:  Routine Physical Examination  Hypertension associated with diabetes ([place]) Comments: Well controlled.  No signs of complications, medication side effects, or red flags.  Continue current regimen.    Type 2 diabetes mellitus without complication, without long-term current use of insulin (HCC) Comments: Well controlled.  No signs of complications, medication side effects, or red flags.  Continue current regimen.   Orders: -     CBC with Differential/Platelet -     Comprehensive metabolic  panel -     Hemoglobin A1c  ED (erectile dysfunction) of organic origin Comments: Well controlled.  No signs of complications, medication side effects, or red flags.  Continue current regimen.   Orders: -     tadalafil (CIALIS) 5 MG tablet; Take 1 tablet (5 mg total) by mouth daily as needed for erectile dysfunction.  Hyperlipidemia associated with type 2 diabetes mellitus ([place]) Comments: Well controlled.  No signs of complications, medication side effects, or red flags.  Continue current regimen.   Orders: -     Lipid panel  B12 deficiency -     Vitamin B12   .  Reviewed expectations re: course of current medical issues. . Discussed self-management of symptoms. . Outlined signs and symptoms indicating need for more acute intervention. . Patient verbalized understanding and all questions were answered. Marland Kitchen Health Maintenance issues including appropriate healthy diet, exercise, and smoking avoidance were discussed with patient. . See orders for this visit as documented in the electronic medical record. . Patient received an After Visit Summary.  Briscoe Deutscher, DO [place], [place] 11/27/2017  Patient Counseling: [x]   Nutrition: Stressed importance of moderation in sodium/caffeine intake, saturated fat and cholesterol, caloric balance, sufficient intake of fresh fruits, vegetables, and fiber.  [x]   Stressed the importance of regular exercise.   []   Substance Abuse: Discussed cessation/primary prevention of tobacco, alcohol, or other drug use; driving or other dangerous activities under the influence; availability of treatment for abuse.   [x]   Injury prevention: Discussed safety belts, safety helmets, smoke detector, smoking near bedding or upholstery.   []   Sexuality: Discussed sexually transmitted diseases, partner selection, use of condoms, avoidance of unintended pregnancy and contraceptive alternatives.   [x]   Dental health: Discussed importance of regular tooth brushing,  flossing, and dental visits.  [x]   Health maintenance and immunizations reviewed. Please refer to Health maintenance section.

## 2017-11-27 ENCOUNTER — Ambulatory Visit (INDEPENDENT_AMBULATORY_CARE_PROVIDER_SITE_OTHER): Payer: 59 | Admitting: Family Medicine

## 2017-11-27 ENCOUNTER — Encounter: Payer: Self-pay | Admitting: Family Medicine

## 2017-11-27 VITALS — BP 130/76 | HR 71 | Temp 98.0°F | Wt 233.0 lb

## 2017-11-27 DIAGNOSIS — E538 Deficiency of other specified B group vitamins: Secondary | ICD-10-CM

## 2017-11-27 DIAGNOSIS — E1159 Type 2 diabetes mellitus with other circulatory complications: Secondary | ICD-10-CM | POA: Diagnosis not present

## 2017-11-27 DIAGNOSIS — E1169 Type 2 diabetes mellitus with other specified complication: Secondary | ICD-10-CM | POA: Diagnosis not present

## 2017-11-27 DIAGNOSIS — E119 Type 2 diabetes mellitus without complications: Secondary | ICD-10-CM | POA: Diagnosis not present

## 2017-11-27 DIAGNOSIS — E785 Hyperlipidemia, unspecified: Secondary | ICD-10-CM | POA: Diagnosis not present

## 2017-11-27 DIAGNOSIS — Z0000 Encounter for general adult medical examination without abnormal findings: Secondary | ICD-10-CM

## 2017-11-27 DIAGNOSIS — N529 Male erectile dysfunction, unspecified: Secondary | ICD-10-CM

## 2017-11-27 DIAGNOSIS — I10 Essential (primary) hypertension: Secondary | ICD-10-CM

## 2017-11-27 DIAGNOSIS — I152 Hypertension secondary to endocrine disorders: Secondary | ICD-10-CM

## 2017-11-27 LAB — LIPID PANEL
Cholesterol: 135 mg/dL (ref 0–200)
HDL: 39.8 mg/dL (ref >39.00)
LDL Cholesterol: 68 mg/dL (ref 0–99)
NonHDL: 95.63
Total CHOL/HDL Ratio: 3
Triglycerides: 137 mg/dL (ref 0.0–149.0)
VLDL: 27.4 mg/dL (ref 0.0–40.0)

## 2017-11-27 LAB — HEMOGLOBIN A1C: Hgb A1c MFr Bld: 5.6 % (ref 4.6–6.5)

## 2017-11-27 LAB — CBC WITH DIFFERENTIAL/PLATELET
Basophils Absolute: 67 cells/uL (ref 0–200)
Basophils Relative: 1 %
Eosinophils Absolute: 147 cells/uL (ref 15–500)
Eosinophils Relative: 2.2 %
HCT: 45.5 % (ref 38.5–50.0)
Hemoglobin: 15.8 g/dL (ref 13.2–17.1)
Lymphs Abs: 2117 cells/uL (ref 850–3900)
MCH: 31.2 pg (ref 27.0–33.0)
MCHC: 34.7 g/dL (ref 32.0–36.0)
MCV: 89.7 fL (ref 80.0–100.0)
MPV: 11.2 fL (ref 7.5–12.5)
Monocytes Relative: 8.6 %
Neutro Abs: 3792 cells/uL (ref 1500–7800)
Neutrophils Relative %: 56.6 %
Platelets: 259 10*3/uL (ref 140–400)
RBC: 5.07 10*6/uL (ref 4.20–5.80)
RDW: 12.2 % (ref 11.0–15.0)
Total Lymphocyte: 31.6 %
WBC mixed population: 576 cells/uL (ref 200–950)
WBC: 6.7 10*3/uL (ref 3.8–10.8)

## 2017-11-27 LAB — COMPREHENSIVE METABOLIC PANEL
ALT: 22 U/L (ref 0–53)
AST: 18 U/L (ref 0–37)
Albumin: 4.5 g/dL (ref 3.5–5.2)
Alkaline Phosphatase: 50 U/L (ref 39–117)
BUN: 13 mg/dL (ref 6–23)
CO2: 30 mEq/L (ref 19–32)
Calcium: 9.6 mg/dL (ref 8.4–10.5)
Chloride: 104 mEq/L (ref 96–112)
Creatinine, Ser: 0.91 mg/dL (ref 0.40–1.50)
GFR: 94.12 mL/min (ref >60.00)
Glucose, Bld: 102 mg/dL — ABNORMAL HIGH (ref 70–99)
Potassium: 4.4 mEq/L (ref 3.5–5.1)
Sodium: 139 mEq/L (ref 135–145)
Total Bilirubin: 0.7 mg/dL (ref 0.2–1.2)
Total Protein: 6.8 g/dL (ref 6.0–8.3)

## 2017-11-27 LAB — VITAMIN B12: Vitamin B-12: 273 pg/mL (ref 211–911)

## 2017-11-27 MED ORDER — TADALAFIL 5 MG PO TABS: 5.0000 mg | ORAL_TABLET | Freq: Every day | ORAL | 0 refills | Status: DC | PRN

## 2017-11-27 NOTE — Addendum Note (Signed)
Addended by: Frutoso Chase A on: 11/27/2017 11:53 AM   Modules accepted: Orders

## 2018-05-27 NOTE — Progress Notes (Signed)
Gary Berry is a 47 y.o. male is here for follow up.  History of Present Illness:   HPI:   Lab Results  Component Value Date   HGBA1C 5.6 11/27/2017    No results found for: Derl Barrow  Lab Results  Component Value Date   CHOL 135 11/27/2017   HDL 39.80 11/27/2017   LDLCALC 68 11/27/2017   TRIG 137.0 11/27/2017   CHOLHDL 3 11/27/2017     Wt Readings from Last 3 Encounters:  05/28/18 240 lb 3.2 oz (109 kg)  11/27/17 233 lb (105.7 kg)  05/25/17 233 lb 12.8 oz (106.1 kg)   BP Readings from Last 3 Encounters:  05/28/18 108/68  11/27/17 130/76  03/20/17 128/78   Lab Results  Component Value Date   CREATININE 0.91 11/27/2017   2. HTN, goal below 130/80: No CP, SOB, Ha, dizziness, edema.    3. Mixed hyperlipidemia: On statin. No myalgias.   4. ED (erectile dysfunction) of organic origin: Needs refill - wants to change to 5 mg, # 30.   5. Obesity, Class II, BMI 35-39.9: Wife is medical weight loss physician with [employer].   Health Maintenance Due  Topic Date Due  . OPHTHALMOLOGY EXAM  11/25/1978  . INFLUENZA VACCINE  05/20/2018  . FOOT EXAM  05/25/2018  . HEMOGLOBIN A1C  05/27/2018   Depression screen PHQ 2/9 11/27/2017 05/25/2017  Decreased Interest 0 0  Down, Depressed, Hopeless 0 0  PHQ - 2 Score 0 0  Altered sleeping 0 -  Tired, decreased energy 0 -  Change in appetite 0 -  Feeling bad or failure about yourself  0 -  Trouble concentrating 0 -  Moving slowly or fidgety/restless 0 -  Suicidal thoughts 0 -  PHQ-9 Score 0 -   PMHx, SurgHx, SocialHx, FamHx, Medications, and Allergies were reviewed in the Visit Navigator and updated as appropriate.   Patient Active Problem List   Diagnosis Date Noted  . Chronic right shoulder pain 03/20/2017  . Type 2 diabetes mellitus without complication, without long-term current use of insulin ([place]) 11/21/2016  . Hypertension associated with diabetes ([place]) 11/21/2016  . Hyperlipidemia associated with type 2  diabetes mellitus ([place]) 11/21/2016  . 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 Obesity, Class II, BMI 35-39.9 11/21/2016   Social History   Tobacco Use  . Smoking status: Never Smoker  . Smokeless tobacco: Former Systems developer    Types: Chew  Substance Use Topics  . Alcohol use: Yes    Comment: social  . Drug use: No   Current Medications and Allergies:   .  aspirin 81 MG chewable tablet, Chew by mouth daily., Disp: , Rfl:  .  Insulin Pen Needle (PEN NEEDLES) 31G X 6 MM MISC, Inject 1 Units into the skin daily., Disp: 90 each, Rfl: 3 .  liraglutide 18 MG/3ML SOPN, Inject 0.3 mLs (1.8 mg total) into the skin daily., Disp: 3 pen, Rfl: 11 .  lisinopril (PRINIVIL,ZESTRIL) 10 MG tablet, Take 1 tablet (10 mg total) by mouth daily., Disp: 90 tablet, Rfl: 4 .  metFORMIN (GLUCOPHAGE) 500 MG tablet, Take 1 tablet (500 mg total) by mouth 2 (two) times daily at 8 am and 10 pm., Disp: 180 tablet, Rfl: 4 .  Omega-3 Fatty Acids (FISH OIL CONCENTRATE PO), Take by mouth., Disp: , Rfl:  .  simvastatin (ZOCOR) 20 MG tablet, Take 1 tablet (20 mg total) by mouth daily at 6 PM., Disp: 90 tablet, Rfl: 4 .  tadalafil (  CIALIS) 5 MG tablet, Take 1 tablet (5 mg total) by mouth daily as needed for erectile dysfunction., Disp: 30 tablet, Rfl: 0  No Known Allergies   Review of Systems   Pertinent items are noted in the HPI. Otherwise, ROS is negative.  Vitals:   Vitals:   05/28/18 0724  BP: 108/68  Pulse: 80  Temp: 98.2 F (36.8 C)  TempSrc: Oral  SpO2: 97%  Weight: 240 lb 3.2 oz (109 kg)  Height: _0  (1.753 m)     Body mass index is 35.47 kg/m.  Physical Exam:   Physical Exam  Constitutional: He is oriented to person, place, and time. He appears well-developed and well-nourished. No distress.  HENT:  Head: Normocephalic and atraumatic.  Right Ear: External ear normal.  Left Ear: External ear normal.  Nose: Nose normal.  Mouth/Throat: Oropharynx is clear and moist.  Eyes: Pupils  are equal, round, and reactive to light. Conjunctivae and EOM are normal.  Neck: Normal range of motion. Neck supple.  Cardiovascular: Normal rate, regular rhythm, normal heart sounds and intact distal pulses.  Pulmonary/Chest: Effort normal and breath sounds normal.  Abdominal: Soft. Bowel sounds are normal.  Musculoskeletal: Normal range of motion.  Neurological: He is alert and oriented to person, place, and time.  Skin: Skin is warm and dry.  Psychiatric: He has a normal mood and affect. His behavior is normal. Judgment and thought content normal.  Nursing note and vitals reviewed.  Diabetic Foot Exam - Simple   Simple Foot Form Diabetic Foot exam was performed with the following findings:  Yes 05/28/2018  7:40 AM  Visual Inspection No deformities, no ulcerations, no other skin breakdown bilaterally:  Yes Sensation Testing Intact to touch and monofilament testing bilaterally:  Yes Pulse Check Posterior Tibialis and Dorsalis pulse intact bilaterally:  Yes Comments     Assessment and Plan:   Diagnoses and all orders for this visit:  Type 2 diabetes mellitus without complication, without long-term current use of insulin (H[facility]) -     metFORMIN (GLUCOPHAGE) 500 MG tablet; Take 1 tablet (500 mg total) by mouth 2 (two) times daily at 8 am and 10 pm. -     liraglutide (VICTOZA) 18 MG/3ML SOPN; Inject 0.3 mLs (1.8 mg total) into the skin daily. -     Hemoglobin A1c  ED (erectile dysfunction) of organic origin -     tadalafil (CIALIS) 5 MG tablet; Take 1 tablet (5 mg total) by mouth daily as needed for erectile dysfunction.  Mixed hyperlipidemia -     simvastatin (ZOCOR) 20 MG tablet; Take 1 tablet (20 mg total) by mouth daily at 6 PM. -     Comp Met (CMET)  HTN, goal below 130/80 -     lisinopril (PRINIVIL,ZESTRIL) 10 MG tablet; Take 1 tablet (10 mg total) by mouth daily.  B12 deficiency -     Vitamin B12   . Reviewed expectations re: course of current medical  issues. . Discussed self-management of symptoms. . Outlined signs and symptoms indicating need for more acute intervention. . Patient verbalized understanding and all questions were answered. Marland Kitchen Health Maintenance issues including appropriate healthy diet, exercise, and smoking avoidance were discussed with patient. . See orders for this visit as documented in the electronic medical record. . Patient received an After Visit Summary.  Briscoe Deutscher, DO [place], [facility] 05/28/2018

## 2018-05-28 ENCOUNTER — Encounter: Payer: Self-pay | Admitting: Family Medicine

## 2018-05-28 ENCOUNTER — Ambulatory Visit (INDEPENDENT_AMBULATORY_CARE_PROVIDER_SITE_OTHER): Payer: 59 | Admitting: Family Medicine

## 2018-05-28 VITALS — BP 108/68 | HR 80 | Temp 98.2°F | Ht 69.0 in | Wt 240.2 lb

## 2018-05-28 DIAGNOSIS — E782 Mixed hyperlipidemia: Secondary | ICD-10-CM

## 2018-05-28 DIAGNOSIS — E119 Type 2 diabetes mellitus without complications: Secondary | ICD-10-CM | POA: Diagnosis not present

## 2018-05-28 DIAGNOSIS — I10 Essential (primary) hypertension: Secondary | ICD-10-CM

## 2018-05-28 DIAGNOSIS — N529 Male erectile dysfunction, unspecified: Secondary | ICD-10-CM

## 2018-05-28 DIAGNOSIS — E538 Deficiency of other specified B group vitamins: Secondary | ICD-10-CM

## 2018-05-28 LAB — COMPREHENSIVE METABOLIC PANEL
ALT: 20 U/L (ref 0–53)
AST: 20 U/L (ref 0–37)
Albumin: 4.5 g/dL (ref 3.5–5.2)
Alkaline Phosphatase: 57 U/L (ref 39–117)
BUN: 16 mg/dL (ref 6–23)
CO2: 24 mEq/L (ref 19–32)
Calcium: 9.5 mg/dL (ref 8.4–10.5)
Chloride: 105 mEq/L (ref 96–112)
Creatinine, Ser: 0.98 mg/dL (ref 0.40–1.50)
GFR: 86.22 mL/min (ref >60.00)
Glucose, Bld: 94 mg/dL (ref 70–99)
Potassium: 4.1 mEq/L (ref 3.5–5.1)
Sodium: 139 mEq/L (ref 135–145)
Total Bilirubin: 0.6 mg/dL (ref 0.2–1.2)
Total Protein: 7.3 g/dL (ref 6.0–8.3)

## 2018-05-28 LAB — VITAMIN B12: Vitamin B-12: 750 pg/mL (ref 211–911)

## 2018-05-28 LAB — HEMOGLOBIN A1C: Hgb A1c MFr Bld: 5.4 % (ref 4.6–6.5)

## 2018-05-28 MED ORDER — LIRAGLUTIDE 18 MG/3ML ~~LOC~~ SOPN: 1.8000 mg | PEN_INJECTOR | Freq: Every day | SUBCUTANEOUS | 11 refills | Status: DC

## 2018-05-28 MED ORDER — LISINOPRIL 10 MG PO TABS: 10.0000 mg | ORAL_TABLET | Freq: Every day | ORAL | 4 refills | Status: DC

## 2018-05-28 MED ORDER — METFORMIN HCL 500 MG PO TABS: 500.0000 mg | ORAL_TABLET | Freq: Two times a day (BID) | ORAL | 4 refills | Status: DC

## 2018-05-28 MED ORDER — TADALAFIL 5 MG PO TABS: 5.0000 mg | ORAL_TABLET | Freq: Every day | ORAL | 0 refills | Status: DC | PRN

## 2018-05-28 MED ORDER — SIMVASTATIN 20 MG PO TABS: 20.0000 mg | ORAL_TABLET | Freq: Every day | ORAL | 4 refills | Status: DC

## 2018-06-01 ENCOUNTER — Other Ambulatory Visit: Payer: Self-pay | Admitting: Surgical

## 2018-06-01 DIAGNOSIS — Z0184 Encounter for antibody response examination: Secondary | ICD-10-CM

## 2018-06-02 ENCOUNTER — Other Ambulatory Visit: Payer: 59

## 2018-06-02 DIAGNOSIS — Z0184 Encounter for antibody response examination: Secondary | ICD-10-CM | POA: Diagnosis not present

## 2018-06-03 LAB — MEASLES/MUMPS/RUBELLA IMMUNITY
Mumps IgG: 126 AU/mL
Rubella: 4.92 index
Rubeola IgG: 173 AU/mL

## 2018-06-27 ENCOUNTER — Other Ambulatory Visit: Payer: Self-pay | Admitting: Family Medicine

## 2018-06-27 DIAGNOSIS — N529 Male erectile dysfunction, unspecified: Secondary | ICD-10-CM

## 2018-06-28 NOTE — Telephone Encounter (Signed)
Please advise on refill.

## 2018-08-22 DIAGNOSIS — Z23 Encounter for immunization: Secondary | ICD-10-CM | POA: Diagnosis not present

## 2018-08-28 ENCOUNTER — Other Ambulatory Visit: Payer: Self-pay | Admitting: Family Medicine

## 2018-09-03 ENCOUNTER — Ambulatory Visit: Payer: 59 | Admitting: Family Medicine

## 2018-09-24 ENCOUNTER — Ambulatory Visit: Payer: 59 | Admitting: Family Medicine

## 2018-10-08 ENCOUNTER — Encounter: Payer: Self-pay | Admitting: Family Medicine

## 2018-10-08 ENCOUNTER — Ambulatory Visit (INDEPENDENT_AMBULATORY_CARE_PROVIDER_SITE_OTHER): Payer: 59 | Admitting: Family Medicine

## 2018-10-08 VITALS — BP 110/64 | HR 69 | Temp 98.7°F | Ht 69.0 in | Wt 241.0 lb

## 2018-10-08 DIAGNOSIS — E1159 Type 2 diabetes mellitus with other circulatory complications: Secondary | ICD-10-CM

## 2018-10-08 DIAGNOSIS — E1169 Type 2 diabetes mellitus with other specified complication: Secondary | ICD-10-CM

## 2018-10-08 DIAGNOSIS — I10 Essential (primary) hypertension: Secondary | ICD-10-CM | POA: Diagnosis not present

## 2018-10-08 DIAGNOSIS — N529 Male erectile dysfunction, unspecified: Secondary | ICD-10-CM

## 2018-10-08 DIAGNOSIS — I152 Hypertension secondary to endocrine disorders: Secondary | ICD-10-CM

## 2018-10-08 DIAGNOSIS — E669 Obesity, unspecified: Secondary | ICD-10-CM | POA: Diagnosis not present

## 2018-10-08 DIAGNOSIS — E119 Type 2 diabetes mellitus without complications: Secondary | ICD-10-CM

## 2018-10-08 DIAGNOSIS — E785 Hyperlipidemia, unspecified: Secondary | ICD-10-CM

## 2018-10-08 NOTE — Patient Instructions (Signed)
The 10-year ASCVD risk score Mikey Bussing DC Brooke Bonito., et al., 2013) is: 3.9%   Values used to calculate the score:     Age: 47 years     Sex: Male     Is Non-Hispanic African American: No     Diabetic: Yes     Tobacco smoker: No     Systolic Blood Pressure: 185 mmHg     Is BP treated: Yes     HDL Cholesterol: 39.8 mg/dL     Total Cholesterol: 135 mg/dL  I will go ahead and order the Cardiac CT.

## 2018-10-08 NOTE — Progress Notes (Signed)
Gary Berry is a 47 y.o. male is here for follow up.  History of Present Illness:   Gary Berry, CMA acting as scribe for Dr. Briscoe Deutscher.   HPI:   Type 2 diabetes mellitus without complication, without long-term current use of insulin ([place]) Patient in for follow up. Taking Metformin bid and Victoza daily. Last A1C was done on 05/28/18 and was 5.4. No problems or side effects from medication at this time. Patient has eye exam next week. Card given to give office so that we can get notes sent from that exam.   ED (erectile dysfunction) of organic origin Patient currently on Cialis 5 mg as needed. No issues or problems.   Mixed hyperlipidemia Patient on Simvastatin 20mg  daily. CMP checked on 05/28/18 was normal. Last lipid done on 11/27/17. Will be due for recheck at next appointment.   HTN, goal below 130/80 Patient currently on Lisinopril 10mg  daily.  Review: taking medications as instructed, no medication side effects noted, no TIAs, no chest pain on exertion, no dyspnea on exertion, no swelling of ankles. Smoker: No.   BP Readings from Last 3 Encounters:  10/08/18 110/64  05/28/18 108/68  11/27/17 130/76   Lab Results  Component Value Date   CREATININE 0.98 05/28/2018   CREATININE 0.91 11/27/2017   CREATININE 0.96 11/26/2016     Health Maintenance Due  Topic Date Due  . OPHTHALMOLOGY EXAM  11/25/1978   Depression screen [facility] 2/9 10/08/2018 11/27/2017 05/25/2017  Decreased Interest 0 0 0  Down, Depressed, Hopeless 0 0 0  PHQ - 2 Score 0 0 0  Altered sleeping 0 0 -  Tired, decreased energy 0 0 -  Change in appetite 0 0 -  Feeling bad or failure about yourself  0 0 -  Trouble concentrating 0 0 -  Moving slowly or fidgety/restless 0 0 -  Suicidal thoughts 0 0 -  PHQ-9 Score 0 0 -  Difficult doing work/chores Not difficult at all - -   PMHx, SurgHx, SocialHx, FamHx, Medications, and Allergies were reviewed in the Visit Navigator and updated as appropriate.   Patient  Active Problem List   Diagnosis Date Noted  . Type 2 diabetes mellitus without complication, without long-term current use of insulin ([place]) 11/21/2016  . Hypertension associated with diabetes ([place]) 11/21/2016  . Hyperlipidemia associated with type 2 diabetes mellitus ([place]) 11/21/2016  . 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 Obesity, Class II, BMI 35-39.9 11/21/2016   Social History   Tobacco Use  . Smoking status: Never Smoker  . Smokeless tobacco: Former Systems developer    Types: Chew  Substance Use Topics  . Alcohol use: Yes    Comment: social  . Drug use: No   Current Medications and Allergies:   .  aspirin 81 MG chewable tablet, Chew by mouth daily., Disp: , Rfl:  .  B-D UF III MINI PEN NEEDLES 31G X 5 MM MISC, USE DAILY AS DIRECTED, Disp: 100 each, Rfl: 3 .  liraglutide (VICTOZA) 18 MG/3ML SOPN, Inject 0.3 mLs (1.8 mg total) into the skin daily., Disp: 3 pen, Rfl: 11 .  lisinopril (PRINIVIL,ZESTRIL) 10 MG tablet, Take 1 tablet (10 mg total) by mouth daily., Disp: 90 tablet, Rfl: 4 .  metFORMIN (GLUCOPHAGE) 500 MG tablet, Take 1 tablet (500 mg total) by mouth 2 (two) times daily at 8 am and 10 pm., Disp: 180 tablet, Rfl: 4 .  Omega-3 Fatty Acids (FISH OIL CONCENTRATE PO), Take by mouth., Disp: ,  Rfl:  .  simvastatin (ZOCOR) 20 MG tablet, Take 1 tablet (20 mg total) by mouth daily at 6 PM., Disp: 90 tablet, Rfl: 4 .  tadalafil (CIALIS) 5 MG tablet, TAKE 1 TABLET BY MOUTH DAILY AS NEEDED FOR ERECTILE DYSFUNCTION, Disp: 30 tablet, Rfl: 0  No Known Allergies   Review of Systems   Pertinent items are noted in the HPI. Otherwise, a complete ROS is negative.  Vitals:   Vitals:   10/08/18 1038  BP: 110/64  Pulse: 69  Temp: 98.7 F (37.1 C)  TempSrc: Oral  SpO2: 97%  Weight: 241 lb (109.3 kg)  Height: 5\' 9"  (1.753 m)     Body mass index is 35.59 kg/m.  Physical Exam:   Physical Exam Vitals signs and nursing note reviewed.  Constitutional:      General: He  is not in acute distress.    Appearance: He is well-developed.  HENT:     Head: Normocephalic and atraumatic.     Right Ear: External ear normal.     Left Ear: External ear normal.     Nose: Nose normal.  Eyes:     Conjunctiva/sclera: Conjunctivae normal.     Pupils: Pupils are equal, round, and reactive to light.  Neck:     Musculoskeletal: Neck supple.  Cardiovascular:     Rate and Rhythm: Normal rate and regular rhythm.  Pulmonary:     Effort: Pulmonary effort is normal.  Abdominal:     General: Bowel sounds are normal.     Palpations: Abdomen is soft.  Musculoskeletal: Normal range of motion.  Skin:    General: Skin is warm.  Neurological:     Mental Status: He is alert.  Psychiatric:        Behavior: Behavior normal.    Assessment and Plan:   Gary Berry was seen today for follow-up.  Diagnoses and all orders for this visit:  Hypertension associated with diabetes ([place]) -     [place]; Future  ED (erectile dysfunction) of organic origin  Obesity, Class II, BMI 35-39.9  Hyperlipidemia associated with type 2 diabetes mellitus ([place])  Type 2 diabetes mellitus without complication, without long-term current use of insulin ([place])   The 10-year ASCVD risk score Mikey Bussing DC Jr., et al., 2013) is: 3.9%   Values used to calculate the score:     Age: 54 years     Sex: Male     Is Non-Hispanic African American: No     Diabetic: Yes     Tobacco smoker: No     Systolic Blood Pressure: 401 mmHg     Is BP treated: Yes     HDL Cholesterol: 39.8 mg/dL     Total Cholesterol: 135 mg/dL  Patient with family history of heart disease. Would like to be proactive with testing. Will order Cardiac CT. Otherwise, he is doing very well.   . Orders and follow up as documented in [place], reviewed diet, exercise and weight control, cardiovascular risk and specific lipid/LDL goals reviewed, reviewed medications and side effects in detail.  . Reviewed expectations re: course of  current medical issues. . Outlined signs and symptoms indicating need for more acute intervention. . Patient verbalized understanding and all questions were answered. . Patient received an After Visit Summary.  CMA served as Education administrator during this visit. History, Physical, and Plan performed by medical provider. The above documentation has been reviewed and is accurate and complete. Briscoe Deutscher, D.O.  Briscoe Deutscher, DO [place], [facility] 10/09/2018

## 2018-10-09 ENCOUNTER — Encounter: Payer: Self-pay | Admitting: Family Medicine

## 2018-10-11 DIAGNOSIS — E119 Type 2 diabetes mellitus without complications: Secondary | ICD-10-CM | POA: Diagnosis not present

## 2018-10-11 LAB — HM DIABETES EYE EXAM

## 2018-10-22 ENCOUNTER — Ambulatory Visit (INDEPENDENT_AMBULATORY_CARE_PROVIDER_SITE_OTHER)
Admission: RE | Admit: 2018-10-22 | Discharge: 2018-10-22 | Disposition: A | Discharge status: Home / Self Care | Payer: Self-pay | Source: Ambulatory Visit | Attending: Family Medicine | Admitting: Family Medicine

## 2018-10-22 DIAGNOSIS — I10 Essential (primary) hypertension: Secondary | ICD-10-CM

## 2018-10-22 DIAGNOSIS — E1159 Type 2 diabetes mellitus with other circulatory complications: Secondary | ICD-10-CM

## 2018-10-22 DIAGNOSIS — I152 Hypertension secondary to endocrine disorders: Secondary | ICD-10-CM

## 2018-10-27 ENCOUNTER — Encounter: Payer: Self-pay | Admitting: Family Medicine

## 2018-11-11 ENCOUNTER — Other Ambulatory Visit: Payer: Self-pay | Admitting: Family Medicine

## 2018-11-11 DIAGNOSIS — N529 Male erectile dysfunction, unspecified: Secondary | ICD-10-CM

## 2018-12-05 ENCOUNTER — Other Ambulatory Visit: Payer: Self-pay | Admitting: Family Medicine

## 2018-12-05 DIAGNOSIS — N529 Male erectile dysfunction, unspecified: Secondary | ICD-10-CM

## 2018-12-06 NOTE — Telephone Encounter (Signed)
Ok to fill 

## 2018-12-31 ENCOUNTER — Other Ambulatory Visit: Payer: Self-pay | Admitting: Family Medicine

## 2018-12-31 DIAGNOSIS — N529 Male erectile dysfunction, unspecified: Secondary | ICD-10-CM

## 2019-01-03 ENCOUNTER — Encounter: Payer: Self-pay | Admitting: Family Medicine

## 2019-01-07 ENCOUNTER — Ambulatory Visit: Payer: 59 | Admitting: Family Medicine

## 2019-02-09 ENCOUNTER — Telehealth: Payer: 59 | Admitting: Family

## 2019-02-09 DIAGNOSIS — L247 Irritant contact dermatitis due to plants, except food: Secondary | ICD-10-CM | POA: Diagnosis not present

## 2019-02-09 MED ORDER — PREDNISONE 10 MG (21) PO TBPK: ORAL_TABLET | ORAL | 0 refills | Status: DC

## 2019-02-09 NOTE — Progress Notes (Signed)

## 2019-02-10 ENCOUNTER — Encounter: Payer: Self-pay | Admitting: Physician Assistant

## 2019-02-10 ENCOUNTER — Ambulatory Visit (INDEPENDENT_AMBULATORY_CARE_PROVIDER_SITE_OTHER): Payer: 59 | Admitting: Physician Assistant

## 2019-02-10 DIAGNOSIS — L247 Irritant contact dermatitis due to plants, except food: Secondary | ICD-10-CM | POA: Diagnosis not present

## 2019-02-10 MED ORDER — PREDNISONE 10 MG PO TABS: ORAL_TABLET | ORAL | 0 refills | Status: DC

## 2019-02-10 NOTE — Progress Notes (Signed)
Virtual Visit via Video   I connected with Gary Berry on 02/10/19 at 10:20 AM EDT by a video enabled telemedicine application and verified that I am speaking with the correct person using two identifiers. Location patient: Home Location provider: [facility], Office Persons participating in the virtual visit: Toney, [place], PA-C   I discussed the limitations of evaluation and management by telemedicine and the availability of in person appointments. The patient expressed understanding and agreed to proceed.  Subjective:   HPI:  Poison Ivy Patient reports that he has diffuse rash all over body from poison ivy. He has tried benadryl and hydrocortisone ointment with minimal relief. He did an e-visit yesterday and was given a 6-day dose pack of prednisone. He has not filled this prescription, as he has history of rebound rash with just a short course of steroids.  He is diabetic and is currently very well controlled. He states that he is on Victoza and tolerating well. Has an appointment with Dr. Juleen China in a week or two to review his diabetes.   ROS: See pertinent positives and negatives per HPI.  Patient Active Problem List   Diagnosis Date Noted  . Type 2 diabetes mellitus without complication, without long-term current use of insulin ([place]) 11/21/2016  . Hypertension associated with diabetes ([place]) 11/21/2016  . Hyperlipidemia associated with type 2 diabetes mellitus ([place]) 11/21/2016  . 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 Obesity, Class II, BMI 35-39.9 11/21/2016    Social History   Tobacco Use  . Smoking status: Never Smoker  . Smokeless tobacco: Former Systems developer    Types: Chew  Substance Use Topics  . Alcohol use: Yes    Comment: social    Current Outpatient Medications:  .  aspirin 81 MG chewable tablet, Chew by mouth daily., Disp: , Rfl:  .  cholecalciferol (VITAMIN D) 1000 units tablet, , Disp: , Rfl:  .  Cyanocobalamin (VITAMIN  B12) 1000 MCG TBCR, , Disp: , Rfl:  .  liraglutide (VICTOZA) 18 MG/3ML SOPN, Inject 0.3 mLs (1.8 mg total) into the skin daily., Disp: 3 pen, Rfl: 11 .  lisinopril (PRINIVIL,ZESTRIL) 10 MG tablet, Take 1 tablet (10 mg total) by mouth daily., Disp: 90 tablet, Rfl: 4 .  metFORMIN (GLUCOPHAGE) 500 MG tablet, Take 1 tablet (500 mg total) by mouth 2 (two) times daily at 8 am and 10 pm., Disp: 180 tablet, Rfl: 4 .  Omega-3 Fatty Acids (FISH OIL CONCENTRATE PO), Take by mouth., Disp: , Rfl:  .  simvastatin (ZOCOR) 20 MG tablet, Take 1 tablet (20 mg total) by mouth daily at 6 PM., Disp: 90 tablet, Rfl: 4 .  tadalafil (CIALIS) 5 MG tablet, TAKE 1 TABLET BY MOUTH DAILY AS NEEDED FOR ERECTILE DYSFUNCTION, Disp: 30 tablet, Rfl: 0 .  predniSONE (DELTASONE) 10 MG tablet, Take 2 tablets daily x 1 week, then 1 tablet daily x 1 week., Disp: 21 tablet, Rfl: 0  No Known Allergies  Objective:   VITALS: Per patient if applicable, see vitals. GENERAL: Alert, appears well and in no acute distress. HEENT: Atraumatic, conjunctiva clear, no obvious abnormalities on inspection of external nose and ears. NECK: Normal movements of the head and neck. CARDIOPULMONARY: No increased WOB. Speaking in clear sentences. I:E ratio WNL.  MS: Moves all visible extremities without noticeable abnormality. PSYCH: Pleasant and cooperative, well-groomed. Speech normal rate and rhythm. Affect is appropriate. Insight and judgement are appropriate. Attention is focused, linear, and appropriate.  NEURO: CN grossly  intact. Oriented as arrived to appointment on time with no prompting. Moves both UE equally.  SKIN: Plant dermatitis throughout body.  Assessment and Plan:   [place] was seen today for poison ivy.  Diagnoses and all orders for this visit:  Contact dermatitis and eczema due to plant  Other orders -     predniSONE (DELTASONE) 10 MG tablet; Take 2 tablets daily x 1 week, then 1 tablet daily x 1 week.   Discussed plans for 20  mg prednisone x 1 week and then 10 mg prednisone x 1 week. Discussed risks of this causing blood sugar elevation. Patient verbalized understanding but is requesting 2 week course of prednisone. Continue daily antihistamine. Has scheduled follow-up with PCP in 2 weeks.  . Reviewed expectations re: course of current medical issues. . Discussed self-management of symptoms. . Outlined signs and symptoms indicating need for more acute intervention. . Patient verbalized understanding and all questions were answered. Marland Kitchen Health Maintenance issues including appropriate healthy diet, exercise, and smoking avoidance were discussed with patient. . See orders for this visit as documented in the electronic medical record.  I discussed the assessment and treatment plan with the patient. The patient was provided an opportunity to ask questions and all were answered. The patient agreed with the plan and demonstrated an understanding of the instructions.   The patient was advised to call back or seek an in-person evaluation if the symptoms worsen or if the condition fails to improve as anticipated.    [place], [place] 02/10/2019

## 2019-02-18 ENCOUNTER — Ambulatory Visit (INDEPENDENT_AMBULATORY_CARE_PROVIDER_SITE_OTHER): Payer: 59 | Admitting: Family Medicine

## 2019-02-18 ENCOUNTER — Encounter: Payer: Self-pay | Admitting: Family Medicine

## 2019-02-18 ENCOUNTER — Other Ambulatory Visit: Payer: Self-pay

## 2019-02-18 VITALS — BP 121/84 | Temp 98.6°F | Ht 69.0 in | Wt 234.0 lb

## 2019-02-18 DIAGNOSIS — E1159 Type 2 diabetes mellitus with other circulatory complications: Secondary | ICD-10-CM

## 2019-02-18 DIAGNOSIS — Z79899 Other long term (current) drug therapy: Secondary | ICD-10-CM

## 2019-02-18 DIAGNOSIS — I10 Essential (primary) hypertension: Secondary | ICD-10-CM

## 2019-02-18 DIAGNOSIS — E785 Hyperlipidemia, unspecified: Secondary | ICD-10-CM

## 2019-02-18 DIAGNOSIS — N529 Male erectile dysfunction, unspecified: Secondary | ICD-10-CM | POA: Diagnosis not present

## 2019-02-18 DIAGNOSIS — E669 Obesity, unspecified: Secondary | ICD-10-CM

## 2019-02-18 DIAGNOSIS — E119 Type 2 diabetes mellitus without complications: Secondary | ICD-10-CM

## 2019-02-18 DIAGNOSIS — I152 Hypertension secondary to endocrine disorders: Secondary | ICD-10-CM

## 2019-02-18 DIAGNOSIS — E1169 Type 2 diabetes mellitus with other specified complication: Secondary | ICD-10-CM

## 2019-02-18 MED ORDER — TADALAFIL 5 MG PO TABS: 5.0000 mg | ORAL_TABLET | Freq: Every day | ORAL | 0 refills | Status: DC | PRN

## 2019-02-18 NOTE — Progress Notes (Signed)
Virtual Visit via Video   Due to the COVID-19 pandemic, this visit was completed with telemedicine (audio/video) technology to reduce patient and provider exposure as well as to preserve personal protective equipment.   I connected with Gary Berry by a video enabled telemedicine application and verified that I am speaking with the correct person using two identifiers. Location patient: Home Location provider: [facility], Office Persons participating in the virtual visit: Jaryd Drew, Briscoe Deutscher, DO Lonell Grandchild, CMA acting as scribe for Dr. Briscoe Deutscher.   I discussed the limitations of evaluation and management by telemedicine and the availability of in person appointments. The patient expressed understanding and agreed to proceed.  Care Team   Patient Care Team: Briscoe Deutscher, DO as PCP - General (Family Medicine)  Subjective:   HPI:   Type 2 diabetes mellitus without complication, without long-term current use of insulin [name] Patient in for follow up. Taking Metformin bid and Victoza daily. Last A1C was done on 05/28/18 and was 5.4. No problems or side effects from medication at this time.  ED (erectile dysfunction) of organic origin Patient currently on Cialis 5 mg as needed. No issues or problems.   Mixed hyperlipidemia Patient on Simvastatin 20mg  daily. CMP checked on 05/28/18 was normal. Last lipid done on 11/27/17. Will be due for recheck at next appointment.   HTN, goal below 130/80 Patient currently on Lisinopril 10mg  daily.  Review: taking medications as instructed, no medication side effects noted, no TIAs, no chest pain on exertion, no dyspnea on exertion, no swelling of ankles. Smoker: No.   Review of Systems  Constitutional: Negative for chills and fever.  HENT: Negative for hearing loss and tinnitus.   Eyes: Negative for blurred vision, double vision and photophobia.  Respiratory: Negative for cough.   Cardiovascular: Negative for chest pain and  palpitations.  Gastrointestinal: Negative for heartburn and nausea.  Genitourinary: Negative for dysuria and urgency.  Musculoskeletal: Negative for myalgias and neck pain.  Skin: Negative for rash.  Neurological: Negative for dizziness, tingling and headaches.  Endo/Heme/Allergies: Negative for environmental allergies. Does not bruise/bleed easily.  Psychiatric/Behavioral: Negative for depression and suicidal ideas.     Patient Active Problem List   Diagnosis Date Noted  . Type 2 diabetes mellitus without complication, without long-term current use of insulin ([place]) 11/21/2016  . Hypertension associated with diabetes ([place]) 11/21/2016  . Hyperlipidemia associated with type 2 diabetes mellitus ([place]) 11/21/2016  . 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 Obesity, Class II, BMI 35-39.9 11/21/2016  . Umbilical hernia 76/73/4193    Social History   Tobacco Use  . Smoking status: Never Smoker  . Smokeless tobacco: Former Systems developer    Types: Chew  Substance Use Topics  . Alcohol use: Yes    Comment: social    Current Outpatient Medications:  .  aspirin 81 MG chewable tablet, Chew by mouth daily., Disp: , Rfl:  .  B-D UF III MINI PEN NEEDLES 31G X 5 MM MISC, See admin instructions., Disp: , Rfl:  .  cholecalciferol (VITAMIN D) 1000 units tablet, , Disp: , Rfl:  .  Cyanocobalamin (VITAMIN B12) 1000 MCG TBCR, , Disp: , Rfl:  .  liraglutide (VICTOZA) 18 MG/3ML SOPN, Inject 0.3 mLs (1.8 mg total) into the skin daily., Disp: 3 pen, Rfl: 11 .  lisinopril (PRINIVIL,ZESTRIL) 10 MG tablet, Take 1 tablet (10 mg total) by mouth daily., Disp: 90 tablet, Rfl: 4 .  metFORMIN (GLUCOPHAGE) 500 MG tablet, Take 1 tablet (  500 mg total) by mouth 2 (two) times daily at 8 am and 10 pm., Disp: 180 tablet, Rfl: 4 .  Omega-3 Fatty Acids (FISH OIL CONCENTRATE PO), Take by mouth., Disp: , Rfl:  .  predniSONE (DELTASONE) 10 MG tablet, Take 2 tablets daily x 1 week, then 1 tablet daily x 1 week., Disp:  21 tablet, Rfl: 0 .  simvastatin (ZOCOR) 20 MG tablet, Take 1 tablet (20 mg total) by mouth daily at 6 PM., Disp: 90 tablet, Rfl: 4 .  tadalafil (CIALIS) 5 MG tablet, Take 1 tablet (5 mg total) by mouth daily as needed for erectile dysfunction., Disp: 90 tablet, Rfl: 0  No Known Allergies  Objective:   VITALS: Per patient if applicable, see vitals. GENERAL: Alert, appears well and in no acute distress. HEENT: Atraumatic, conjunctiva clear, no obvious abnormalities on inspection of external nose and ears. NECK: Normal movements of the head and neck. CARDIOPULMONARY: No increased WOB. Speaking in clear sentences. I:E ratio WNL.  MS: Moves all visible extremities without noticeable abnormality. PSYCH: Pleasant and cooperative, well-groomed. Speech normal rate and rhythm. Affect is appropriate. Insight and judgement are appropriate. Attention is focused, linear, and appropriate.  NEURO: CN grossly intact. Oriented as arrived to appointment on time with no prompting. Moves both UE equally.  SKIN: No obvious lesions, wounds, erythema, or cyanosis noted on face or hands.  Depression screen [facility] 2/9 10/08/2018 11/27/2017 05/25/2017  Decreased Interest 0 0 0  Down, Depressed, Hopeless 0 0 0  PHQ - 2 Score 0 0 0  Altered sleeping 0 0 -  Tired, decreased energy 0 0 -  Change in appetite 0 0 -  Feeling bad or failure about yourself  0 0 -  Trouble concentrating 0 0 -  Moving slowly or fidgety/restless 0 0 -  Suicidal thoughts 0 0 -  PHQ-9 Score 0 0 -  Difficult doing work/chores Not difficult at all - -    Assessment and Plan:   Gary Berry was seen today for follow-up.  Diagnoses and all orders for this visit:  Hyperlipidemia associated with type 2 diabetes mellitus ([place]) -     Comprehensive metabolic panel; Future -     Lipid panel; Future  ED (erectile dysfunction) of organic origin -     tadalafil (CIALIS) 5 MG tablet; Take 1 tablet (5 mg total) by mouth daily as needed for erectile  dysfunction.  Hypertension associated with diabetes ([place])  Obesity, Class II, BMI 35-39.9  Type 2 diabetes mellitus without complication, without long-term current use of insulin ([facility]) -     CBC with Differential/Platelet; Future -     Hemoglobin A1c; Future  Medication management -     Vitamin B12; Future    . COVID-19 Education: The signs and symptoms of COVID-19 were discussed with the patient and how to seek care for testing if needed. The importance of social distancing was discussed today. . Reviewed expectations re: course of current medical issues. . Discussed self-management of symptoms. . Outlined signs and symptoms indicating need for more acute intervention. . Patient verbalized understanding and all questions were answered. Marland Kitchen Health Maintenance issues including appropriate healthy diet, exercise, and smoking avoidance were discussed with patient. . See orders for this visit as documented in the electronic medical record.  Briscoe Deutscher, DO  Records requested if needed. Time spent: 25 minutes, of which >50% was spent in obtaining information about his symptoms, reviewing his previous labs, evaluations, and treatments, counseling him about his condition (please  see the discussed topics above), and developing a plan to further investigate it; he had a number of questions which I addressed.

## 2019-02-20 ENCOUNTER — Encounter: Payer: Self-pay | Admitting: Family Medicine

## 2019-02-21 ENCOUNTER — Other Ambulatory Visit (INDEPENDENT_AMBULATORY_CARE_PROVIDER_SITE_OTHER): Payer: 59

## 2019-02-21 DIAGNOSIS — E1169 Type 2 diabetes mellitus with other specified complication: Secondary | ICD-10-CM

## 2019-02-21 DIAGNOSIS — E785 Hyperlipidemia, unspecified: Secondary | ICD-10-CM

## 2019-02-21 DIAGNOSIS — E119 Type 2 diabetes mellitus without complications: Secondary | ICD-10-CM

## 2019-02-21 DIAGNOSIS — Z79899 Other long term (current) drug therapy: Secondary | ICD-10-CM

## 2019-02-21 LAB — CBC WITH DIFFERENTIAL/PLATELET
Basophils Absolute: 0.1 10*3/uL (ref 0.0–0.1)
Basophils Relative: 1.2 % (ref 0.0–3.0)
Eosinophils Absolute: 0.2 10*3/uL (ref 0.0–0.7)
Eosinophils Relative: 2.1 % (ref 0.0–5.0)
HCT: 43.3 % (ref 39.0–52.0)
Hemoglobin: 14.7 g/dL (ref 13.0–17.0)
Lymphocytes Relative: 33.9 % (ref 12.0–46.0)
Lymphs Abs: 2.5 10*3/uL (ref 0.7–4.0)
MCHC: 34 g/dL (ref 30.0–36.0)
MCV: 93.9 fl (ref 78.0–100.0)
Monocytes Absolute: 0.8 10*3/uL (ref 0.1–1.0)
Monocytes Relative: 11.3 % (ref 3.0–12.0)
Neutro Abs: 3.8 10*3/uL (ref 1.4–7.7)
Neutrophils Relative %: 51.5 % (ref 43.0–77.0)
Platelets: 220 10*3/uL (ref 150.0–400.0)
RBC: 4.61 Mil/uL (ref 4.22–5.81)
RDW: 13.6 % (ref 11.5–15.5)
WBC: 7.3 10*3/uL (ref 4.0–10.5)

## 2019-02-21 LAB — LIPID PANEL
Cholesterol: 149 mg/dL (ref 0–200)
HDL: 52.1 mg/dL (ref >39.00)
LDL Cholesterol: 68 mg/dL (ref 0–99)
NonHDL: 96.93
Total CHOL/HDL Ratio: 3
Triglycerides: 145 mg/dL (ref 0.0–149.0)
VLDL: 29 mg/dL (ref 0.0–40.0)

## 2019-02-21 LAB — COMPREHENSIVE METABOLIC PANEL
ALT: 25 U/L (ref 0–53)
AST: 22 U/L (ref 0–37)
Albumin: 4.3 g/dL (ref 3.5–5.2)
Alkaline Phosphatase: 57 U/L (ref 39–117)
BUN: 21 mg/dL (ref 6–23)
CO2: 26 mEq/L (ref 19–32)
Calcium: 9.1 mg/dL (ref 8.4–10.5)
Chloride: 105 mEq/L (ref 96–112)
Creatinine, Ser: 0.94 mg/dL (ref 0.40–1.50)
GFR: 84.87 mL/min (ref >60.00)
Glucose, Bld: 86 mg/dL (ref 70–99)
Potassium: 4.1 mEq/L (ref 3.5–5.1)
Sodium: 140 mEq/L (ref 135–145)
Total Bilirubin: 0.7 mg/dL (ref 0.2–1.2)
Total Protein: 6.3 g/dL (ref 6.0–8.3)

## 2019-02-21 LAB — VITAMIN B12: Vitamin B-12: 748 pg/mL (ref 211–911)

## 2019-02-21 LAB — HEMOGLOBIN A1C: Hgb A1c MFr Bld: 5.6 % (ref 4.6–6.5)

## 2019-05-14 ENCOUNTER — Other Ambulatory Visit: Payer: Self-pay | Admitting: Family Medicine

## 2019-05-14 DIAGNOSIS — N529 Male erectile dysfunction, unspecified: Secondary | ICD-10-CM

## 2019-05-18 NOTE — Telephone Encounter (Signed)
Last fill 02/18/19  #90/0 Last OV 02/18/19 Next OV 08/22/19

## 2019-05-24 IMAGING — CT CT HEART SCORING
2 series · 16 of 20 positions shown, 18 images · non-contrast
Comparison: No priors.

Addendum:
EXAM:
OVER-READ INTERPRETATION  CT CHEST

The following report is an over-read performed by radiologist Dr.
Leopold Folk [REDACTED] on 10/22/2018. This
over-read does not include interpretation of cardiac or coronary
anatomy or pathology. The coronary calcium score interpretation by
the cardiologist is attached.
CLINICAL DATA: 47M with diabetes, hypertension, and hyperlipidemia
for risk stratification
Coronary Calcium Score
TECHNIQUE: The patient was scanned on a Siemens Force scanner. Axial
non-contrast 3 mm slices were carried out through the heart. The
data set was analyzed on a dedicated work station and scored using
the Agatson method.

[Series 3: casc 3.0 i36f 2 bestdiast 69 % · axial · 0.37mm/px · z∈[+1330,+1423]mm · 8 of 41 slices shown, 10 images]
[im 5/41  vessel]
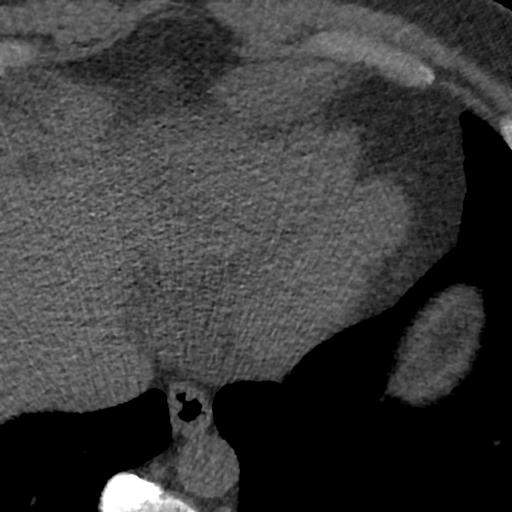
[im 5/41  lung]
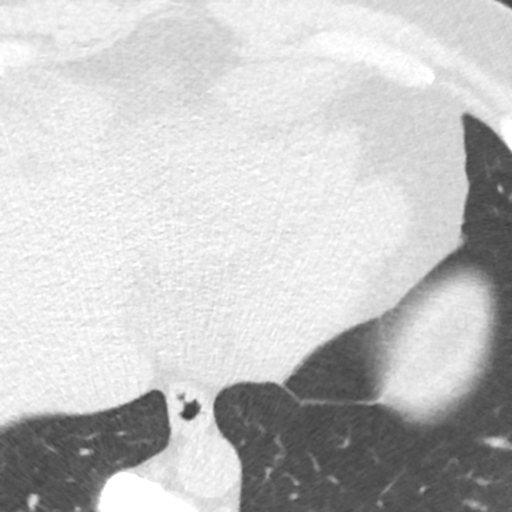
[im 9/41  vessel]
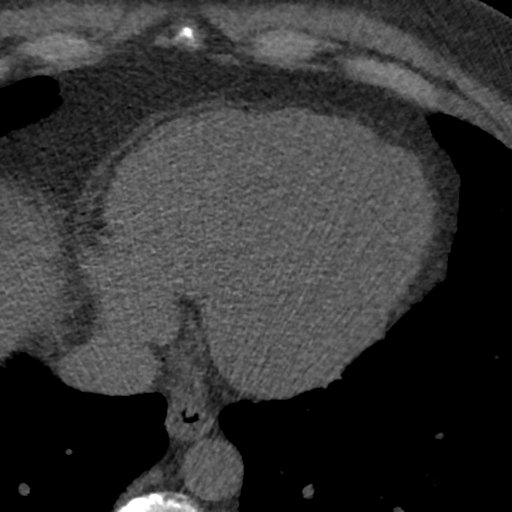
[im 14/41  vessel]
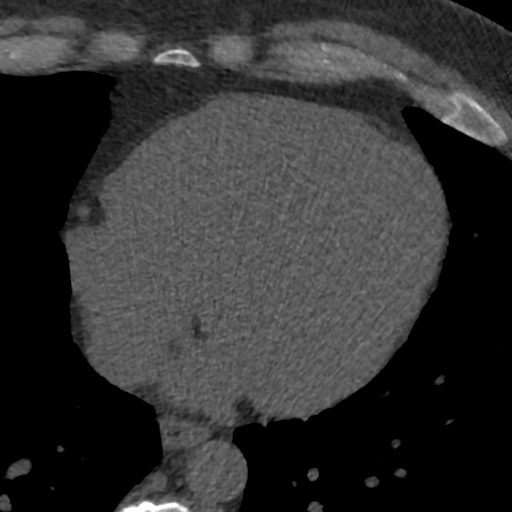
[im 18/41  vessel]
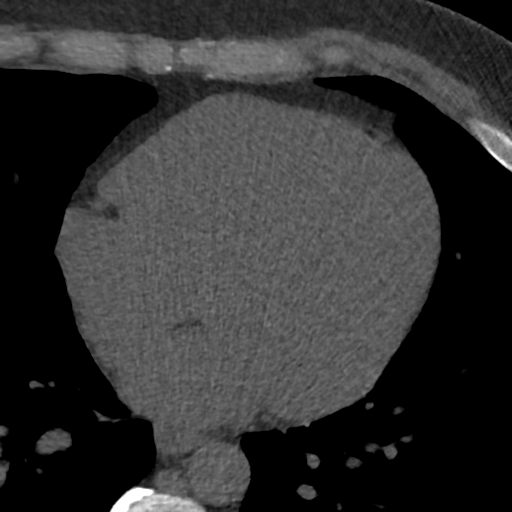
[im 23/41  vessel]
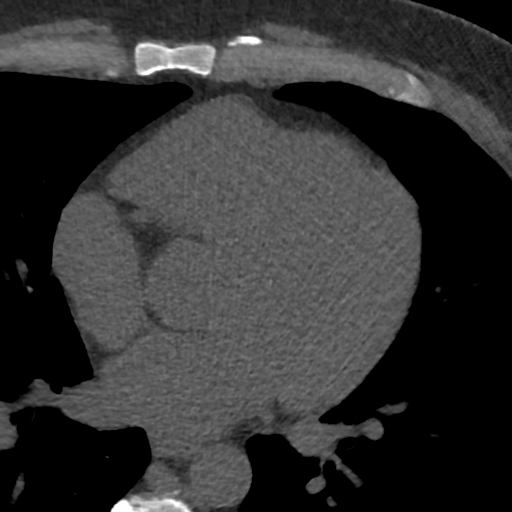
[im 23/41  lung]
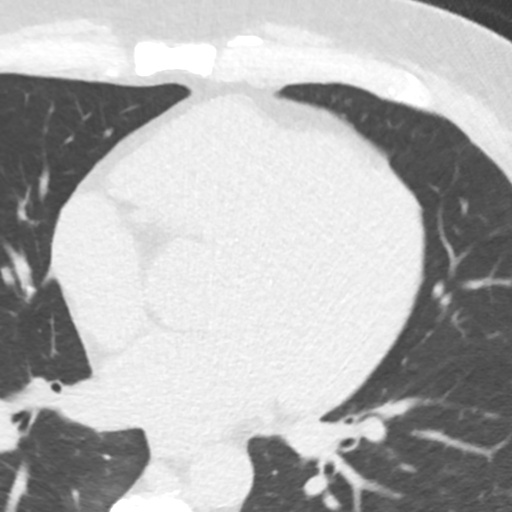
[im 27/41  vessel]
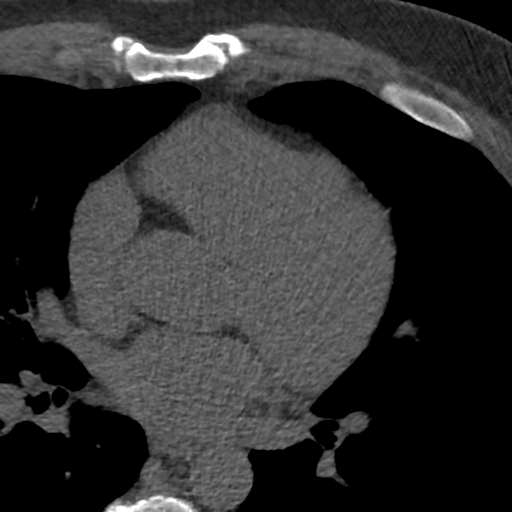
[im 32/41  vessel]
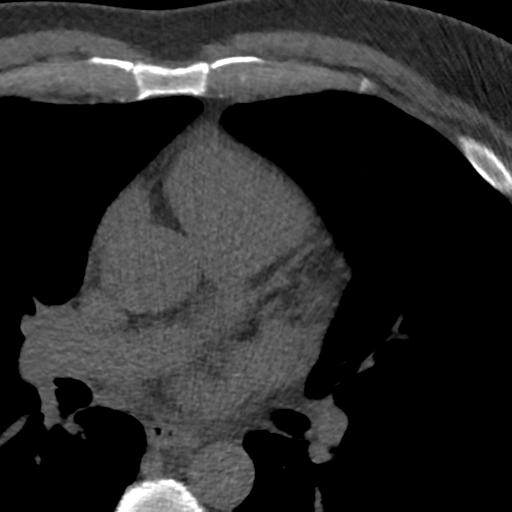
[im 36/41  vessel]
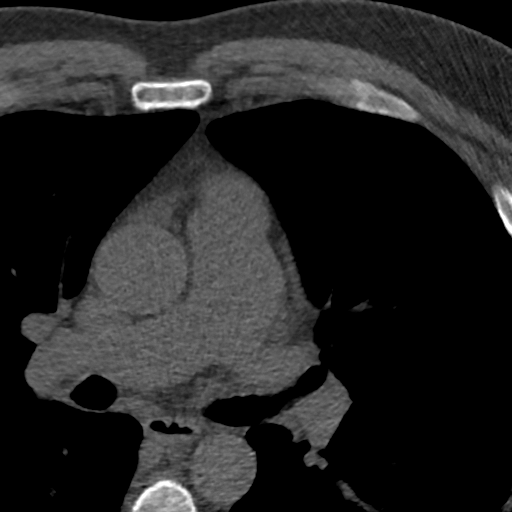

[Series 5: lung st 68 % · axial · 0.67mm/px · z∈[+1330,+1423]mm · 8 of 41 slices shown]
[im 5/41  lung]
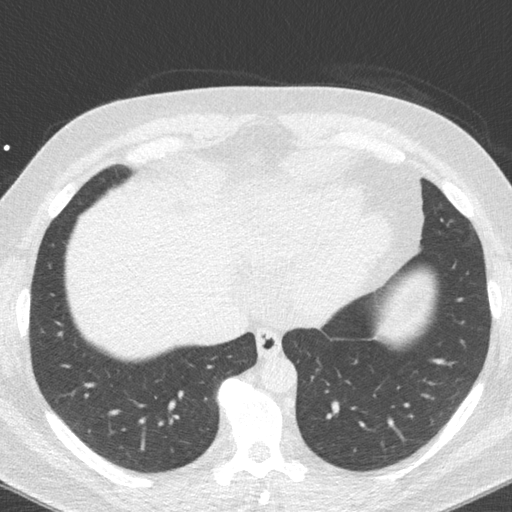
[im 9/41  lung]
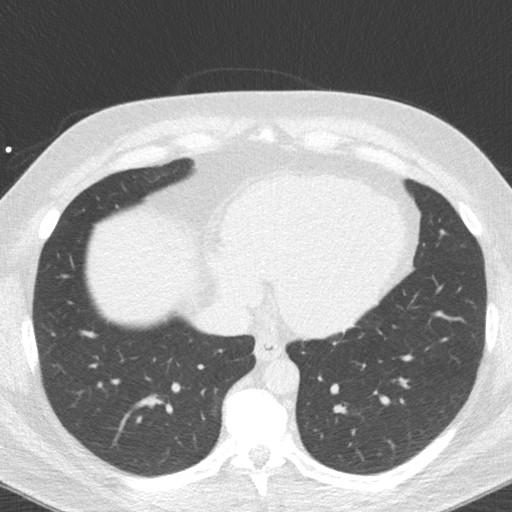
[im 14/41  lung]
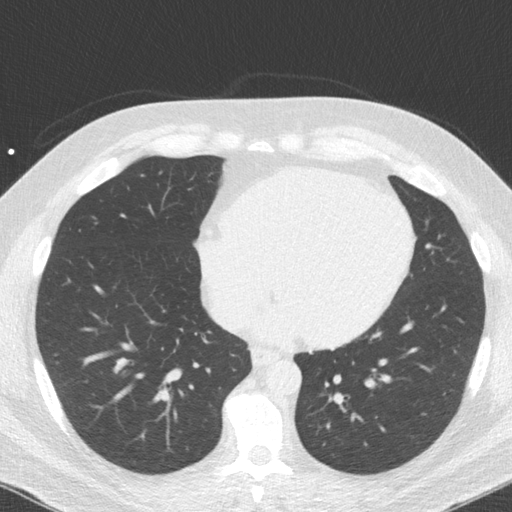
[im 18/41  lung]
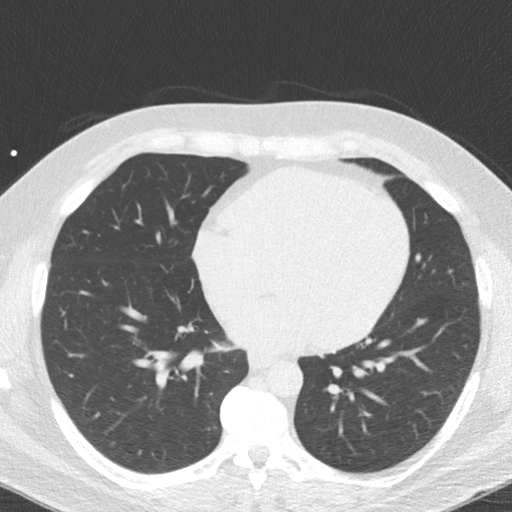
[im 23/41  lung]
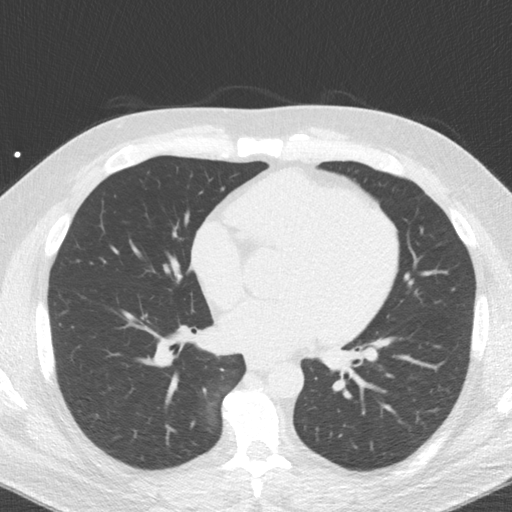
[im 27/41  lung]
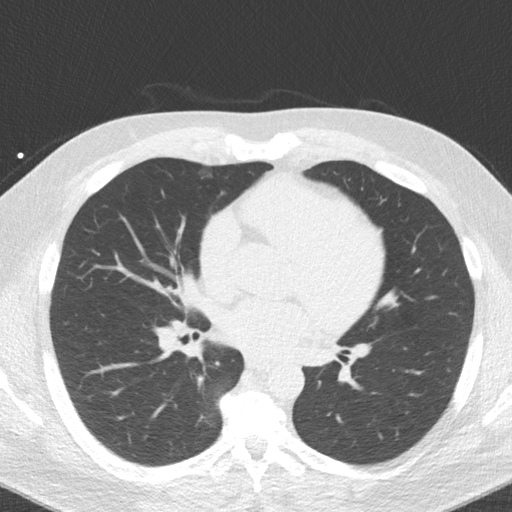
[im 32/41  lung]
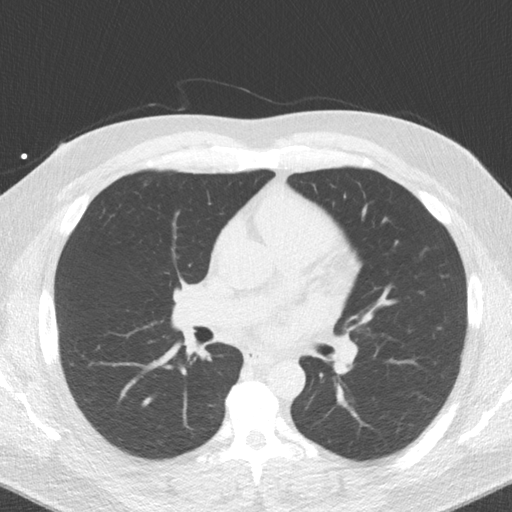
[im 36/41  lung]
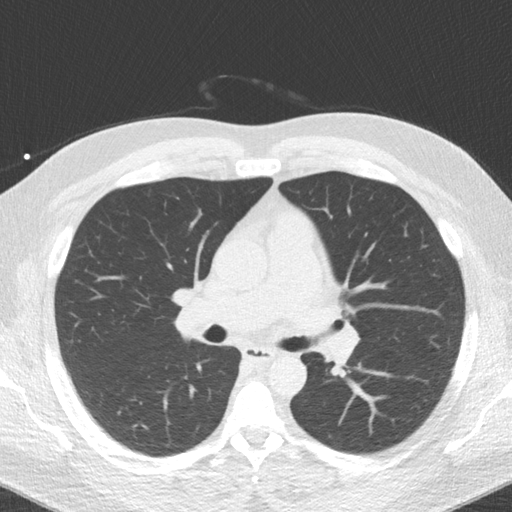

[16 of 20 positions shown; findings below may reference images not displayed]

FINDINGS: Within the visualized portions of the thorax there are no suspicious
appearing pulmonary nodules or masses, there is no acute
consolidative airspace disease, no pleural effusions, no
pneumothorax and no lymphadenopathy. Visualized portions of the
upper abdomen are unremarkable. There are no aggressive appearing
lytic or blastic lesions noted in the visualized portions of the
skeleton.
IMPRESSION: No significant incidental noncardiac findings are noted.
FINDINGS: Non-cardiac: See separate report from [REDACTED].

Ascending Aorta: Normal size.  3.4 cm.  No calcification.

Pericardium: Normal

Coronary arteries: Right dominant. Normal origins. No coronary
calcification.
IMPRESSION: Coronary calcium score of 0. This was 0 percentile for age and sex
matched control.

*** End of Addendum ***

## 2019-05-26 ENCOUNTER — Other Ambulatory Visit: Payer: Self-pay | Admitting: Family Medicine

## 2019-05-26 DIAGNOSIS — E119 Type 2 diabetes mellitus without complications: Secondary | ICD-10-CM

## 2019-06-21 ENCOUNTER — Encounter: Payer: Self-pay | Admitting: Family Medicine

## 2019-06-21 NOTE — Telephone Encounter (Signed)
Ok for referral?

## 2019-06-22 ENCOUNTER — Encounter: Payer: Self-pay | Admitting: Gastroenterology

## 2019-06-22 ENCOUNTER — Other Ambulatory Visit: Payer: Self-pay

## 2019-06-22 DIAGNOSIS — Z1211 Encounter for screening for malignant neoplasm of colon: Secondary | ICD-10-CM

## 2019-07-15 ENCOUNTER — Encounter: Payer: Self-pay | Admitting: Gastroenterology

## 2019-07-15 ENCOUNTER — Other Ambulatory Visit: Payer: Self-pay

## 2019-07-15 ENCOUNTER — Ambulatory Visit (AMBULATORY_SURGERY_CENTER): Payer: Self-pay | Admitting: *Deleted

## 2019-07-15 VITALS — Temp 96.6°F | Ht 69.0 in | Wt 242.6 lb

## 2019-07-15 DIAGNOSIS — Z1211 Encounter for screening for malignant neoplasm of colon: Secondary | ICD-10-CM

## 2019-07-15 MED ORDER — NA SULFATE-K SULFATE-MG SULF 17.5-3.13-1.6 GM/177ML PO SOLN: ORAL | 0 refills | Status: DC

## 2019-07-15 NOTE — Progress Notes (Signed)
Patient is here in-person for PV. Patient denies any allergies to eggs or soy. Patient denies any problems with anesthesia/sedation. Patient denies any oxygen use at home. Patient denies taking any diet/weight loss medications or blood thinners. Patient is not being treated for MRSA or C-diff. EMMI education assisgned to patient on colonoscopy, this was explained and instructions given to patient. Suprep $15 off coupon given to pt.   Pt is aware that care partner will wait in the car during procedure; if they feel like they will be too hot to wait in the car; they may wait in the lobby. Patient is aware to bring only one care partner. We want them to wear a mask (we do not have any that we can provide them), practice social distancing, and we will check their temperatures when they get here.  I did remind patient that their care partner needs to stay in the parking lot the entire time. Pt will wear mask into building.

## 2019-07-21 ENCOUNTER — Encounter: Payer: Self-pay | Admitting: Family Medicine

## 2019-07-28 ENCOUNTER — Telehealth: Payer: Self-pay

## 2019-07-28 NOTE — Telephone Encounter (Signed)
Pt responded "no" to all screening questions °

## 2019-07-28 NOTE — Telephone Encounter (Signed)
Covid-19 screening questions   Do you now or have you had a fever in the last 14 days?  Do you have any respiratory symptoms of shortness of breath or cough now or in the last 14 days?  Do you have any family members or close contacts with diagnosed or suspected Covid-19 in the past 14 days?  Have you been tested for Covid-19 and found to be positive?       

## 2019-07-29 ENCOUNTER — Ambulatory Visit (AMBULATORY_SURGERY_CENTER): Payer: 59 | Admitting: Gastroenterology

## 2019-07-29 ENCOUNTER — Other Ambulatory Visit: Payer: Self-pay | Admitting: Family Medicine

## 2019-07-29 ENCOUNTER — Encounter: Payer: Self-pay | Admitting: Gastroenterology

## 2019-07-29 ENCOUNTER — Other Ambulatory Visit: Payer: Self-pay

## 2019-07-29 VITALS — BP 109/67 | HR 70 | Temp 98.1°F | Resp 18 | Ht 69.0 in | Wt 242.0 lb

## 2019-07-29 DIAGNOSIS — Z1211 Encounter for screening for malignant neoplasm of colon: Secondary | ICD-10-CM

## 2019-07-29 DIAGNOSIS — D120 Benign neoplasm of cecum: Secondary | ICD-10-CM

## 2019-07-29 DIAGNOSIS — K635 Polyp of colon: Secondary | ICD-10-CM | POA: Diagnosis not present

## 2019-07-29 DIAGNOSIS — I10 Essential (primary) hypertension: Secondary | ICD-10-CM

## 2019-07-29 MED ORDER — SODIUM CHLORIDE 0.9 % IV SOLN: 500.0000 mL | Freq: Once | INTRAVENOUS | Status: DC

## 2019-07-29 NOTE — Patient Instructions (Signed)
YOU HAD AN ENDOSCOPIC PROCEDURE TODAY AT [facility]:   Refer to the procedure report that was given to you for any specific questions about what was found during the examination.  If the procedure report does not answer your questions, please call your gastroenterologist to clarify.  If you requested that your care partner not be given the details of your procedure findings, then the procedure report has been included in a sealed envelope for you to review at your convenience later.  YOU SHOULD EXPECT: Some feelings of bloating in the abdomen. Passage of more gas than usual.  Walking can help get rid of the air that was put into your GI tract during the procedure and reduce the bloating. If you had a lower endoscopy (such as a colonoscopy or flexible sigmoidoscopy) you may notice spotting of blood in your stool or on the toilet paper. If you underwent a bowel prep for your procedure, you may not have a normal bowel movement for a few days.  Please Note:  You might notice some irritation and congestion in your nose or some drainage.  This is from the oxygen used during your procedure.  There is no need for concern and it should clear up in a day or so.  SYMPTOMS TO REPORT IMMEDIATELY:   Following lower endoscopy (colonoscopy or flexible sigmoidoscopy):  Excessive amounts of blood in the stool  Significant tenderness or worsening of abdominal pains  Swelling of the abdomen that is new, acute  Fever of 100F or higher    For urgent or emergent issues, a gastroenterologist can be reached at any hour by calling [phone].   DIET:  We do recommend a small meal at first, but then you may proceed to your regular diet.  Drink plenty of fluids but you should avoid alcoholic beverages for 24 hours.  ACTIVITY:  You should plan to take it easy for the rest of today and you should NOT DRIVE or use heavy machinery until tomorrow (because of the sedation medicines used during the test).     FOLLOW UP: Our staff will call the number listed on your records 48-72 hours following your procedure to check on you and address any questions or concerns that you may have regarding the information given to you following your procedure. If we do not reach you, we will leave a message.  We will attempt to reach you two times.  During this call, we will ask if you have developed any symptoms of COVID 19. If you develop any symptoms (ie: fever, flu-like symptoms, shortness of breath, cough etc.) before then, please call [phone].  If you test positive for Covid 19 in the 2 weeks post procedure, please call and report this information to [place].    If any biopsies were taken you will be contacted by phone or by letter within the next 1-3 weeks.  Please call us at [phone] if you have not heard about the biopsies in 3 weeks.    SIGNATURES/CONFIDENTIALITY: You and/or your care partner have signed paperwork which will be entered into your electronic medical record.  These signatures attest to the fact that that the information above on your After Visit Summary has been reviewed and is understood.  Full responsibility of the confidentiality of this discharge information lies with you and/or your care-partner.    Handouts were given to your care partner on polyps and hemorrhoids. Your blood sugar was 111 in the recovery room. You may resume  current medications today. Await biopsy results. Please call if any questions or concerns.   

## 2019-07-29 NOTE — Progress Notes (Signed)
No problems noted in the recovery room. maw 

## 2019-07-29 NOTE — Op Note (Signed)
[facility] Patient Name: Gary Berry Procedure Date: 07/29/2019 10:15 AM MRN: [id number] Endoscopist: Remo Lipps P. Havery Moros , MD Age: 48 Referring MD:  Date of Birth: 04-Jul-1971 Gender: Male Account #: [id number] Procedure:                Colonoscopy Indications:              Screening for colorectal malignant neoplasm, This                            is the patient's first colonoscopy Medicines:                Monitored Anesthesia Care Procedure:                Pre-Anesthesia Assessment:                           - Prior to the procedure, a History and Physical                            was performed, and patient medications and                            allergies were reviewed. The patient's tolerance of                            previous anesthesia was also reviewed. The risks                            and benefits of the procedure and the sedation                            options and risks were discussed with the patient.                            All questions were answered, and informed consent                            was obtained. Prior Anticoagulants: The patient has                            taken no previous anticoagulant or antiplatelet                            agents. ASA Grade Assessment: II - A patient with                            mild systemic disease. After reviewing the risks                            and benefits, the patient was deemed in                            satisfactory condition to undergo the procedure.  After obtaining informed consent, the colonoscope                            was passed under direct vision. Throughout the                            procedure, the patient's blood pressure, pulse, and                            oxygen saturations were monitored continuously. The                            Colonoscope was introduced through the anus and                            advanced to the the  cecum, identified by                            appendiceal orifice and ileocecal valve. The                            colonoscopy was performed without difficulty. The                            patient tolerated the procedure well. The quality                            of the bowel preparation was good. The ileocecal                            valve, appendiceal orifice, and rectum were                            photographed. Scope In: 10:21:28 AM Scope Out: 10:43:27 AM Scope Withdrawal Time: 0 hours 18 minutes 53 seconds  Total Procedure Duration: 0 hours 21 minutes 59 seconds  Findings:                 The perianal and digital rectal examinations were                            normal.                           A 3 mm polyp was found in the cecum. The polyp was                            sessile. The polyp was removed with a cold snare.                            Resection and retrieval were complete.                           The colon was tortuous.  Internal hemorrhoids were found during retroflexion.                           The exam was otherwise without abnormality. Complications:            No immediate complications. Estimated blood loss:                            Minimal. Estimated Blood Loss:     Estimated blood loss was minimal. Impression:               - One 3 mm polyp in the cecum, removed with a cold                            snare. Resected and retrieved.                           - Tortuous colon.                           - Internal hemorrhoids.                           - The examination was otherwise normal. Recommendation:           - Patient has a contact number available for                            emergencies. The signs and symptoms of potential                            delayed complications were discussed with the                            patient. Return to normal activities tomorrow.                            Written  discharge instructions were provided to the                            patient.                           - Resume previous diet.                           - Continue present medications.                           - Await pathology results. Remo Lipps P. Armbruster, MD 07/29/2019 10:46:14 AM This report has been signed electronically.

## 2019-07-29 NOTE — Progress Notes (Signed)
A/ox3, pleased with MAC, report to RN 

## 2019-07-29 NOTE — Progress Notes (Signed)
Called to room to assist during endoscopic procedure.  Patient ID and intended procedure confirmed with present staff. Received instructions for my participation in the procedure from the performing physician.  

## 2019-07-29 NOTE — Progress Notes (Signed)
Pt's states no medical or surgical changes since previsit or office visit.Vitals-Nancy C Temp-Lisa

## 2019-07-31 NOTE — Progress Notes (Signed)
Virtual Visit via Video   Due to the COVID-19 pandemic, this visit was completed with telemedicine (audio/video) technology to reduce patient and provider exposure as well as to preserve personal protective equipment.   I connected with Gary Berry by a video enabled telemedicine application and verified that I am speaking with the correct person using two identifiers. Location patient: Home Location provider: [facility], Office Persons participating in the virtual visit: Minton Ravenscroft, Briscoe Deutscher, DO Lonell Grandchild, CMA acting as scribe for Dr. Briscoe Deutscher.   I discussed the limitations of evaluation and management by telemedicine and the availability of in person appointments. The patient expressed understanding and agreed to proceed.  Care Team   Patient Care Team: Briscoe Deutscher, DO as PCP - General (Family Medicine)  Subjective:   HPI:   Type 2 diabetes mellitus without complication, without long-term current use of insulin (HCC)Patient in for follow up. Taking Metformin bid and Victoza daily. Last A1C was done on 02/21/2019 and was 5.6 No problems or side effects from medication at this time.  ED (erectile dysfunction) of organic origin Patient currently on Cialis 5 mg as needed. No issues or problems.  Mixed hyperlipidemia Patient on Simvastatin 20mg  daily. CMP checked on 02/21/19 was normal. Last lipid done on 02/21/19.   HTN, goal below 130/80 Patient currently on Lisinopril 10mg  daily. Review:taking medications as instructed, no medication side effects noted, no TIAs, no chest pain on exertion, no dyspnea on exertion, no swelling of ankles. Smoker:No.  Review of Systems  Constitutional: Negative.  Negative for fever.  HENT: Negative.   Eyes: Negative.   Respiratory: Negative.   Cardiovascular: Negative.   Gastrointestinal: Negative.   Genitourinary: Negative.   Musculoskeletal: Negative.   Skin: Negative.   Neurological: Negative.     Endo/Heme/Allergies: Negative.   Psychiatric/Behavioral: Negative.      Patient Active Problem List   Diagnosis Date Noted  . Type 2 diabetes mellitus without complication, without long-term current use of insulin ([place]) 11/21/2016  . Hypertension associated with diabetes ([place]) 11/21/2016  . Hyperlipidemia associated with type 2 diabetes mellitus ([place]) 11/21/2016  . 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 Obesity, Class II, BMI 35-39.9 11/21/2016  . Umbilical hernia [id number]    Social History   Tobacco Use  . Smoking status: Never Smoker  . Smokeless tobacco: Former Systems developer    Types: Chew  Substance Use Topics  . Alcohol use: Yes    Alcohol/week: 3.0 standard drinks    Types: 3 Standard drinks or equivalent per week    Comment: social    Current Outpatient Medications:  .  aspirin 81 MG chewable tablet, Chew by mouth daily., Disp: , Rfl:  .  B-D UF III MINI PEN NEEDLES 31G X 5 MM MISC, See admin instructions., Disp: , Rfl:  .  cholecalciferol (VITAMIN D) 1000 units tablet, , Disp: , Rfl:  .  Cyanocobalamin (VITAMIN B12) 1000 MCG TBCR, , Disp: , Rfl:  .  liraglutide (VICTOZA) 18 MG/3ML SOPN, INJECT 1.8 MG UNDER THE SKIN ONCE DAILY, Disp: 9 pen, Rfl: 11 .  lisinopril (ZESTRIL) 10 MG tablet, Take 1 tablet (10 mg total) by mouth daily., Disp: 90 tablet, Rfl: 3 .  metFORMIN (GLUCOPHAGE) 500 MG tablet, Take 1 tablet (500 mg total) by mouth 2 (two) times daily at 8 am and 10 pm., Disp: 180 tablet, Rfl: 4 .  Omega-3 Fatty Acids (FISH OIL CONCENTRATE PO), Take by mouth., Disp: , Rfl:  .  simvastatin (  ZOCOR) 20 MG tablet, Take 1 tablet (20 mg total) by mouth daily at 6 PM., Disp: 90 tablet, Rfl: 3 .  tadalafil (CIALIS) 5 MG tablet, TAKE 1 TABLET BY MOUTH DAILY AS NEEDED FOR ERECTILE DYSFUNCTION, Disp: 90 tablet, Rfl: 0  No Known Allergies  Objective:   VITALS: Per patient if applicable, see vitals. GENERAL: Alert, appears well and in no acute distress. HEENT:  Atraumatic, conjunctiva clear, no obvious abnormalities on inspection of external nose and ears. NECK: Normal movements of the head and neck. CARDIOPULMONARY: No increased WOB. Speaking in clear sentences. I:E ratio WNL.  MS: Moves all visible extremities without noticeable abnormality. PSYCH: Pleasant and cooperative, well-groomed. Speech normal rate and rhythm. Affect is appropriate. Insight and judgement are appropriate. Attention is focused, linear, and appropriate.  NEURO: CN grossly intact. Oriented as arrived to appointment on time with no prompting. Moves both UE equally.  SKIN: No obvious lesions, wounds, erythema, or cyanosis noted on face or hands.  Depression screen [facility] 2/9 10/08/2018 11/27/2017 05/25/2017  Decreased Interest 0 0 0  Down, Depressed, Hopeless 0 0 0  PHQ - 2 Score 0 0 0  Altered sleeping 0 0 -  Tired, decreased energy 0 0 -  Change in appetite 0 0 -  Feeling bad or failure about yourself  0 0 -  Trouble concentrating 0 0 -  Moving slowly or fidgety/restless 0 0 -  Suicidal thoughts 0 0 -  PHQ-9 Score 0 0 -  Difficult doing work/chores Not difficult at all - -    Assessment and Plan:   Gary Berry was seen today for follow-up.  Diagnoses and all orders for this visit:  Obesity, Class II, BMI 35-39.9  HTN, goal below 130/80 -     lisinopril (ZESTRIL) 10 MG tablet; Take 1 tablet (10 mg total) by mouth daily.  Type 2 diabetes mellitus without complication, without long-term current use of insulin (HCC) -     CBC with Differential/Platelet; Future -     Comprehensive metabolic panel; Future -     Hemoglobin A1c; Future -     metFORMIN (GLUCOPHAGE) 500 MG tablet; Take 1 tablet (500 mg total) by mouth 2 (two) times daily at 8 am and 10 pm. -     liraglutide (VICTOZA) 18 MG/3ML SOPN; INJECT 1.8 MG UNDER THE SKIN ONCE DAILY  Mixed hyperlipidemia -     Lipid panel; Future -     simvastatin (ZOCOR) 20 MG tablet; Take 1 tablet (20 mg total) by mouth daily at 6  PM.  ED (erectile dysfunction) of organic origin -     tadalafil (CIALIS) 5 MG tablet; TAKE 1 TABLET BY MOUTH DAILY AS NEEDED FOR ERECTILE DYSFUNCTION  Vitamin D deficiency -     VITAMIN D 25 Hydroxy (Vit-D Deficiency, Fractures); Future    . COVID-19 Education: The signs and symptoms of COVID-19 were discussed with the patient and how to seek care for testing if needed. The importance of social distancing was discussed today. . Reviewed expectations re: course of current medical issues. . Discussed self-management of symptoms. . Outlined signs and symptoms indicating need for more acute intervention. . Patient verbalized understanding and all questions were answered. Marland Kitchen Health Maintenance issues including appropriate healthy diet, exercise, and smoking avoidance were discussed with patient. . See orders for this visit as documented in the electronic medical record.  Briscoe Deutscher, DO  Records requested if needed. Time spent: 25 minutes, of which >50% was spent in obtaining information about  his symptoms, reviewing his previous labs, evaluations, and treatments, counseling him about his condition (please see the discussed topics above), and developing a plan to further investigate it; he had a number of questions which I addressed.

## 2019-08-01 ENCOUNTER — Ambulatory Visit (INDEPENDENT_AMBULATORY_CARE_PROVIDER_SITE_OTHER): Payer: 59 | Admitting: Family Medicine

## 2019-08-01 ENCOUNTER — Encounter: Payer: Self-pay | Admitting: Family Medicine

## 2019-08-01 VITALS — BP 126/84 | Temp 98.1°F | Ht 69.0 in | Wt 240.0 lb

## 2019-08-01 DIAGNOSIS — E669 Obesity, unspecified: Secondary | ICD-10-CM | POA: Diagnosis not present

## 2019-08-01 DIAGNOSIS — E119 Type 2 diabetes mellitus without complications: Secondary | ICD-10-CM

## 2019-08-01 DIAGNOSIS — N529 Male erectile dysfunction, unspecified: Secondary | ICD-10-CM

## 2019-08-01 DIAGNOSIS — I10 Essential (primary) hypertension: Secondary | ICD-10-CM

## 2019-08-01 DIAGNOSIS — E782 Mixed hyperlipidemia: Secondary | ICD-10-CM | POA: Diagnosis not present

## 2019-08-01 DIAGNOSIS — E559 Vitamin D deficiency, unspecified: Secondary | ICD-10-CM

## 2019-08-01 MED ORDER — METFORMIN HCL 500 MG PO TABS: 500.0000 mg | ORAL_TABLET | Freq: Two times a day (BID) | ORAL | 4 refills | Status: DC

## 2019-08-01 MED ORDER — VICTOZA 18 MG/3ML ~~LOC~~ SOPN: PEN_INJECTOR | SUBCUTANEOUS | 11 refills | Status: DC

## 2019-08-01 MED ORDER — SIMVASTATIN 20 MG PO TABS: 20.0000 mg | ORAL_TABLET | Freq: Every day | ORAL | 3 refills | Status: DC

## 2019-08-01 MED ORDER — LISINOPRIL 10 MG PO TABS: 10.0000 mg | ORAL_TABLET | Freq: Every day | ORAL | 3 refills | Status: DC

## 2019-08-01 MED ORDER — TADALAFIL 5 MG PO TABS: ORAL_TABLET | ORAL | 0 refills | Status: DC

## 2019-08-02 ENCOUNTER — Telehealth: Payer: Self-pay | Admitting: *Deleted

## 2019-08-02 ENCOUNTER — Other Ambulatory Visit: Payer: Self-pay

## 2019-08-02 ENCOUNTER — Other Ambulatory Visit (INDEPENDENT_AMBULATORY_CARE_PROVIDER_SITE_OTHER): Payer: 59

## 2019-08-02 DIAGNOSIS — E782 Mixed hyperlipidemia: Secondary | ICD-10-CM | POA: Diagnosis not present

## 2019-08-02 DIAGNOSIS — E119 Type 2 diabetes mellitus without complications: Secondary | ICD-10-CM | POA: Diagnosis not present

## 2019-08-02 DIAGNOSIS — E559 Vitamin D deficiency, unspecified: Secondary | ICD-10-CM | POA: Diagnosis not present

## 2019-08-02 LAB — VITAMIN D 25 HYDROXY (VIT D DEFICIENCY, FRACTURES): VITD: 31.6 ng/mL (ref 30.00–100.00)

## 2019-08-02 LAB — CBC WITH DIFFERENTIAL/PLATELET
Basophils Absolute: 0.1 10*3/uL (ref 0.0–0.1)
Basophils Relative: 1 % (ref 0.0–3.0)
Eosinophils Absolute: 0.2 10*3/uL (ref 0.0–0.7)
Eosinophils Relative: 2.6 % (ref 0.0–5.0)
HCT: 46.7 % (ref 39.0–52.0)
Hemoglobin: 15.7 g/dL (ref 13.0–17.0)
Lymphocytes Relative: 24.5 % (ref 12.0–46.0)
Lymphs Abs: 2.1 10*3/uL (ref 0.7–4.0)
MCHC: 33.7 g/dL (ref 30.0–36.0)
MCV: 93.7 fl (ref 78.0–100.0)
Monocytes Absolute: 0.7 10*3/uL (ref 0.1–1.0)
Monocytes Relative: 8.1 % (ref 3.0–12.0)
Neutro Abs: 5.6 10*3/uL (ref 1.4–7.7)
Neutrophils Relative %: 63.8 % (ref 43.0–77.0)
Platelets: 230 10*3/uL (ref 150.0–400.0)
RBC: 4.98 Mil/uL (ref 4.22–5.81)
RDW: 12.6 % (ref 11.5–15.5)
WBC: 8.7 10*3/uL (ref 4.0–10.5)

## 2019-08-02 LAB — COMPREHENSIVE METABOLIC PANEL
ALT: 25 U/L (ref 0–53)
AST: 20 U/L (ref 0–37)
Albumin: 4.6 g/dL (ref 3.5–5.2)
Alkaline Phosphatase: 66 U/L (ref 39–117)
BUN: 12 mg/dL (ref 6–23)
CO2: 26 mEq/L (ref 19–32)
Calcium: 9.9 mg/dL (ref 8.4–10.5)
Chloride: 102 mEq/L (ref 96–112)
Creatinine, Ser: 0.95 mg/dL (ref 0.40–1.50)
GFR: 83.69 mL/min (ref >60.00)
Glucose, Bld: 91 mg/dL (ref 70–99)
Potassium: 4.2 mEq/L (ref 3.5–5.1)
Sodium: 139 mEq/L (ref 135–145)
Total Bilirubin: 0.7 mg/dL (ref 0.2–1.2)
Total Protein: 6.9 g/dL (ref 6.0–8.3)

## 2019-08-02 LAB — LIPID PANEL
Cholesterol: 147 mg/dL (ref 0–200)
HDL: 46.1 mg/dL (ref >39.00)
NonHDL: 100.68
Total CHOL/HDL Ratio: 3
Triglycerides: 273 mg/dL — ABNORMAL HIGH (ref 0.0–149.0)
VLDL: 54.6 mg/dL — ABNORMAL HIGH (ref 0.0–40.0)

## 2019-08-02 LAB — HEMOGLOBIN A1C: Hgb A1c MFr Bld: 5.6 % (ref 4.6–6.5)

## 2019-08-02 LAB — LDL CHOLESTEROL, DIRECT: Direct LDL: 69 mg/dL

## 2019-08-02 NOTE — Telephone Encounter (Signed)
YOU HAD AN ENDOSCOPIC PROCEDURE TODAY AT [facility]:   Refer to the procedure report that was given to you for any specific questions about what was found during the examination.  If the procedure report does not answer your questions, please call your gastroenterologist to clarify.  If you requested that your care partner not be given the details of your procedure findings, then the procedure report has been included in a sealed envelope for you to review at your convenience later.  YOU SHOULD EXPECT: Some feelings of bloating in the abdomen. Passage of more gas than usual.  Walking can help get rid of the air that was put into your GI tract during the procedure and reduce the bloating. If you had a lower endoscopy (such as a colonoscopy or flexible sigmoidoscopy) you may notice spotting of blood in your stool or on the toilet paper. If you underwent a bowel prep for your procedure, you may not have a normal bowel movement for a few days.  Please Note:  You might notice some irritation and congestion in your nose or some drainage.  This is from the oxygen used during your procedure.  There is no need for concern and it should clear up in a day or so.  SYMPTOMS TO REPORT IMMEDIATELY:   Following lower endoscopy (colonoscopy or flexible sigmoidoscopy):  Excessive amounts of blood in the stool  Significant tenderness or worsening of abdominal pains  Swelling of the abdomen that is new, acute  Fever of 100F or higher   Following upper endoscopy (EGD)  Vomiting of blood or coffee ground material  New chest pain or pain under the shoulder blades  Painful or persistently difficult swallowing  New shortness of breath  Fever of 100F or higher  Black, tarry-looking stools  For urgent or emergent issues, a gastroenterologist can be reached at any hour by calling [phone].   DIET:  We do recommend a small meal at first, but then you may proceed to your regular diet.  Drink  plenty of fluids but you should avoid alcoholic beverages for 24 hours.  ACTIVITY:  You should plan to take it easy for the rest of today and you should NOT DRIVE or use heavy machinery until tomorrow (because of the sedation medicines used during the test).    FOLLOW UP: Our staff will call the number listed on your records 48-72 hours following your procedure to check on you and address any questions or concerns that you may have regarding the information given to you following your procedure. If we do not reach you, we will leave a message.  We will attempt to reach you two times.  During this call, we will ask if you have developed any symptoms of COVID 19. If you develop any symptoms (ie: fever, flu-like symptoms, shortness of breath, cough etc.) before then, please call [phone].  If you test positive for Covid 19 in the 2 weeks post procedure, please call and report this information to [place].    If any biopsies were taken you will be contacted by phone or by letter within the next 1-3 weeks.  Please call us at [phone] if you have not heard about the biopsies in 3 weeks.    1. Have you developed a fever since your procedure? no  2.   Have you had an respiratory symptoms (SOB or cough) since your procedure? no  3.   Have you tested positive for COVID 19 since your  procedure no  4.   Have you had any family members/close contacts diagnosed with the COVID 19 since your procedure?  no   If yes to any of these questions please route to Joylene John, RN and Alphonsa Gin, Therapist, sports.     Follow up Call-  Call back number 07/29/2019  Post procedure Call Back phone  # [phone]  Permission to leave phone message Yes     Patient questions:  Do you have a fever, pain , or abdominal swelling? No. Pain Score  0 *  Have you tolerated food without any problems? Yes.    Have you been able to return to your normal activities? Yes.    Do you have any questions about your discharge  instructions: Diet   No. Medications  No. Follow up visit  No.  Do you have questions or concerns about your Care? No.  Actions: * If pain score is 4 or above: No action needed, pain <4.

## 2019-08-03 ENCOUNTER — Encounter: Payer: Self-pay | Admitting: Family Medicine

## 2019-08-22 ENCOUNTER — Ambulatory Visit: Payer: 59 | Admitting: Family Medicine

## 2019-10-06 ENCOUNTER — Other Ambulatory Visit: Payer: Self-pay

## 2019-10-07 ENCOUNTER — Encounter: Payer: Self-pay | Admitting: Family Medicine

## 2019-10-07 ENCOUNTER — Ambulatory Visit (INDEPENDENT_AMBULATORY_CARE_PROVIDER_SITE_OTHER): Payer: 59 | Admitting: Family Medicine

## 2019-10-07 VITALS — BP 120/80 | HR 97 | Ht 69.0 in | Wt 239.6 lb

## 2019-10-07 DIAGNOSIS — M5412 Radiculopathy, cervical region: Secondary | ICD-10-CM

## 2019-10-07 DIAGNOSIS — M25511 Pain in right shoulder: Secondary | ICD-10-CM

## 2019-10-07 NOTE — Progress Notes (Signed)
    Subjective:    CC: R shoulder pain  I, Gary Berry, LAT, ATC, am serving as scribe for Dr. Lynne Leader.  HPI: Pt is a 48 y/o male presenting w/ c/o R shoulder pain x  2 weeks w/ no known MOI.  He locates his pain to the R posterior/superior shoulder just inferior to spine of scapula at distal shoulder.    He rates his current pain as a 1-2/10 and a 4/10 at it's worst.  He describes the pain as an aching/throbbing.  Most positions caused discomfort.  He has tried IBU.  He notes that the majority of his symptoms resolved about a week ago.  He states that when he had the pain, it radiated into the R arm and into the R 4th and 5th fingers.  He also notes N/T into those same fingers but that has resolved.  Past medical history, Surgical history, Family history not pertinant except as noted below, Social history, Allergies, and medications have been entered into the medical record, reviewed, and no changes needed.   Review of Systems: No headache, visual changes, nausea, vomiting, diarrhea, constipation, dizziness, abdominal pain, skin rash, fevers, chills, night sweats, weight loss, swollen lymph nodes, body aches, joint swelling, muscle aches, chest pain, shortness of breath, mood changes, visual or auditory hallucinations.   Objective:    Vitals:   10/07/19 0840  BP: 120/80  Pulse: 97  SpO2: 98%   General: Well Developed, well nourished, and in no acute distress.  Neuro/Psych: Alert and oriented x3, extra-ocular muscles intact, able to move all 4 extremities, sensation grossly intact. Skin: Warm and dry, no rashes noted.  Respiratory: Not using accessory muscles, speaking in full sentences, trachea midline.  Cardiovascular: Pulses palpable, no extremity edema. Abdomen: Does not appear distended. MSK:  C-spine: Nontender to cervical midline.  Nontender otherwise. Normal cervical motion. Upper extremity strength reflexes and sensation are equal normal throughout except for listed  below.  Right shoulder: Normal-appearing nontender. Normal motion. Strength: 5/5 abduction.  4+/5 external rotation.  5/5 internal rotation. Negative Hawkins and Neer's test.  Negative empty can test.  Negative Yergason's and speeds test.  Left shoulder: Normal-appearing nontender. Normal motion. Normal strength. Negative impingement and biceps tendinitis test.  Right elbow: Normal-appearing nontender normal motion.  No pain with elbow motion or strength testing.  No pain with resisted wrist extension. Negative Tinel's at cubital tunnel.  Right hand and wrist normal-appearing normal motion nontender negative Tinel's test.  Normal strength.  Pulses cap refill and sensation intact bilateral upper extremities.    Impression and Recommendations:    Assessment and Plan: 48 y.o. male with right shoulder and arm pain.  Multifactorial. 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  Additionally he has some pain and paresthesias into his arm and hand that correspond with C8 nerve root.  Likely resolving cervical radiculopathy.  Plan to treat conservatively with home exercise program.  Will consider gabapentin or prednisone burst in the future if needed however symptoms are mild enough at this point they are not going to be helpful.  Recheck back if not improving.  Would consider referral to physical therapy as well in the future if not improving.  Discussed warning signs or symptoms. Please see discharge instructions. Patient expresses understanding.   The above documentation has been reviewed and is accurate and complete Lynne Leader

## 2019-10-07 NOTE — Patient Instructions (Signed)
Thank you for coming in today. Complete the home exercises as well.  Recheck back as needed if symptoms return or worsen.

## 2019-12-08 ENCOUNTER — Encounter (INDEPENDENT_AMBULATORY_CARE_PROVIDER_SITE_OTHER): Payer: Self-pay | Admitting: Family Medicine

## 2019-12-08 NOTE — Telephone Encounter (Signed)
Please review

## 2019-12-08 NOTE — Telephone Encounter (Signed)
Please call pt and schedule TOC with Dr. Jonni Sanger.

## 2020-01-12 ENCOUNTER — Ambulatory Visit (INDEPENDENT_AMBULATORY_CARE_PROVIDER_SITE_OTHER): Payer: 59 | Admitting: Family Medicine

## 2020-01-12 ENCOUNTER — Other Ambulatory Visit: Payer: Self-pay

## 2020-01-12 ENCOUNTER — Encounter: Payer: Self-pay | Admitting: Family Medicine

## 2020-01-12 VITALS — BP 136/78 | HR 78 | Temp 97.5°F | Ht 69.0 in | Wt 240.2 lb

## 2020-01-12 DIAGNOSIS — E119 Type 2 diabetes mellitus without complications: Secondary | ICD-10-CM | POA: Diagnosis not present

## 2020-01-12 DIAGNOSIS — I152 Hypertension secondary to endocrine disorders: Secondary | ICD-10-CM

## 2020-01-12 DIAGNOSIS — E1159 Type 2 diabetes mellitus with other circulatory complications: Secondary | ICD-10-CM | POA: Diagnosis not present

## 2020-01-12 DIAGNOSIS — E1169 Type 2 diabetes mellitus with other specified complication: Secondary | ICD-10-CM

## 2020-01-12 DIAGNOSIS — N529 Male erectile dysfunction, unspecified: Secondary | ICD-10-CM

## 2020-01-12 DIAGNOSIS — I10 Essential (primary) hypertension: Secondary | ICD-10-CM

## 2020-01-12 DIAGNOSIS — E669 Obesity, unspecified: Secondary | ICD-10-CM | POA: Diagnosis not present

## 2020-01-12 DIAGNOSIS — E785 Hyperlipidemia, unspecified: Secondary | ICD-10-CM

## 2020-01-12 LAB — POCT GLYCOSYLATED HEMOGLOBIN (HGB A1C): Hemoglobin A1C: 5.3 % (ref 4.0–5.6)

## 2020-01-12 MED ORDER — TADALAFIL 5 MG PO TABS: ORAL_TABLET | ORAL | 1 refills | Status: DC

## 2020-01-12 NOTE — Patient Instructions (Signed)
Please return in 6 months for your annual complete physical; please come fasting.  Please set up an appointment for a diabetic eye exam and have the results sent to me.  It was a pleasure meeting you!  If you have any questions or concerns, please don't hesitate to send me a message via MyChart or call the office at [phone]. Thank you for visiting with Gary Berry today! It's our pleasure caring for you.

## 2020-01-12 NOTE — Progress Notes (Signed)
Subjective  CC:  Chief Complaint  Patient presents with  . Transitions Of Care    TOC from Dr. Juleen China  . Diabetes    does not check at home. Takes Metformin  . Hyperlipidemia    takes simvastatin 20 mg and omega 3. no side effects  . Hypertension    readings are <140/80 at home. Denies HA, dizziness, or visual changes    HPI: Gary Berry is a 49 y.o. male who presents to the office today for follow up of diabetes, hypertension and problems listed above in the chief complaint.   Diabetic f/u: His diabetic control is reported as Unchanged. Has had excellent control over last 10 years. Has lost 50 pounds since diagnosis. Has been on metformin and victoza daily for years. Reviewed a1cs over last 3 years in chart. No diabetic complications. He denies exertional CP or SOB or symptomatic hypoglycemia. He denies foot sores. Overdue for eye exam due to covid. Sees [facility]   Hypertension f/u: Control is good . Pt reports he is doing well. taking medications as instructed, no medication side effects noted, no TIAs, no chest pain on exertion, no dyspnea on exertion, no swelling of ankles.  He denies adverse effects from his BP medications. Compliance with medication is good.    Hyperlipidemia f/u: Patient presents for follow up of lipids. Lipids have been well controlled on meds w/o myalgias or side effects. Compliance with treatment thus far has been excellent. The patient does not use medications that may worsen dyslipidemias (corticosteroids, progestins, anabolic steroids, diuretics, beta-blockers, amiodarone, cyclosporine, olanzapine). The patient exercises intermittently. The patient is not known to have coexisting coronary artery disease.   ED: uses cialis and requests refill. Works well.    Assessment  1. Type 2 diabetes mellitus without complication, without long-term current use of insulin ([place])   2. Hypertension associated with diabetes ([place])   3. Hyperlipidemia  associated with type 2 diabetes mellitus ([place])   4. ED (erectile dysfunction) of organic origin   5. Obesity, Class II, BMI 35-39.9      Plan   Diabetes is currently very well controlled. Could consider stepping down therapy. Pt to discuss with wife and let me know. Pt to schedule eye exam.   Hypertension is currently well controlled. On ace or arb  Hyperlipidemia f/u:  At goal on statin.   [id number]. Refilled meds. Diabetic education: ongoing education regarding chronic disease management for diabetes was given today. We continue to reinforce the ABC's of diabetic management: A1c (<7 or 8 dependent upon patient), tight blood pressure control, and cholesterol management with goal LDL < 100 minimally. We discuss diet strategies, exercise recommendations, medication options and possible side effects. At each visit, we review recommended immunizations and preventive care recommendations for diabetics and stress that good diabetic control can prevent other problems. See below for this patient's data. Hypertension education: ongoing education regarding management of these chronic disease states was given. Management strategies discussed on successive visits include dietary and exercise recommendations, goals of achieving and maintaining IBW, and lifestyle modifications aiming for adequate sleep and minimizing stressors.   Follow up: Return in about 6 months (around 07/14/2020) for complete physical, follow up of diabetes and hypertension..  Orders Placed This Encounter  Procedures  . POCT glycosylated hemoglobin (Hb A1C)   Meds ordered this encounter  Medications  . tadalafil (CIALIS) 5 MG tablet    Sig: TAKE 1 TABLET BY MOUTH DAILY AS NEEDED FOR ERECTILE DYSFUNCTION  Dispense:  90 tablet    Refill:  1      I reviewed the patients updated PMH, FH, and SocHx.  Patient Active Problem List   Diagnosis Date Noted  . Type 2 diabetes mellitus without complication, without long-term  current use of insulin ([place]) 11/21/2016  . Hypertension associated with diabetes ([place]) 11/21/2016  . Hyperlipidemia associated with type 2 diabetes mellitus ([place]) 11/21/2016  . 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 Obesity, Class II, BMI 35-39.9 11/21/2016  . Umbilical hernia [id number]   Immunization History  Administered Date(s) Administered  . Influenza Inj Mdck Quad Pf 08/01/2017  . Influenza, Seasonal, Injecte, Preservative Fre 08/01/2017  . Influenza,inj,Quad PF,6+ Mos 12/06/2015, 11/21/2016, 08/22/2018  . Influenza-Unspecified 08/22/2018, 08/01/2019  . PFIZER SARS-COV-2 Vaccination 12/30/2019  . Pneumococcal Polysaccharide-23 05/25/2017  . Tdap 12/18/2011   Health Maintenance  Topic Date Due  . OPHTHALMOLOGY EXAM  10/12/2019  . HEMOGLOBIN A1C  07/14/2020  . FOOT EXAM  01/11/2021  . TETANUS/TDAP  12/17/2021  . COLONOSCOPY  07/28/2029  . INFLUENZA VACCINE  Completed  . PNEUMOCOCCAL POLYSACCHARIDE VACCINE AGE 43-64 HIGH RISK  Completed  . HIV Screening  Discontinued   Diabetes and HTN Related Lab Review: Lab Results  Component Value Date   HGBA1C 5.3 01/12/2020   HGBA1C 5.6 08/02/2019   HGBA1C 5.6 02/21/2019    No results found for: Derl Barrow Lab Results  Component Value Date   CREATININE 0.95 08/02/2019   BUN 12 08/02/2019   NA 139 08/02/2019   K 4.2 08/02/2019   CL 102 08/02/2019   CO2 26 08/02/2019   Lab Results  Component Value Date   CHOL 147 08/02/2019   CHOL 149 02/21/2019   CHOL 135 11/27/2017   Lab Results  Component Value Date   HDL 46.10 08/02/2019   HDL 52.10 02/21/2019   HDL 39.80 11/27/2017   Lab Results  Component Value Date   LDLCALC 68 02/21/2019   LDLCALC 68 11/27/2017   LDLCALC 57 11/26/2016   Lab Results  Component Value Date   TRIG 273.0 (H) 08/02/2019   TRIG 145.0 02/21/2019   TRIG 137.0 11/27/2017   Lab Results  Component Value Date   CHOLHDL 3 08/02/2019   CHOLHDL 3 02/21/2019   CHOLHDL  3 11/27/2017   Lab Results  Component Value Date   LDLDIRECT 69.0 08/02/2019   The 10-year ASCVD risk score Mikey Bussing DC Jr., et al., 2013) is: 6.8%   Values used to calculate the score:     Age: 49 years     Sex: Male     Is Non-Hispanic African American: No     Diabetic: Yes     Tobacco smoker: No     Systolic Blood Pressure: [id number] mmHg     Is BP treated: Yes     HDL Cholesterol: 46.1 mg/dL     Total Cholesterol: 147 mg/dL  BP Readings from Last 3 Encounters:  01/12/20 136/78  10/07/19 120/80  08/01/19 126/84   Wt Readings from Last 3 Encounters:  01/12/20 240 lb 3.2 oz (109 kg)  10/07/19 239 lb 9.6 oz (108.7 kg)  08/01/19 240 lb (108.9 kg)    Allergies: Patient has No Known Allergies. Family History: Patient family history includes Atrial fibrillation in his father; Colon polyps in his father; Diabetes in his mother; Hypertension in his mother. Social History:  Patient  reports that he has never smoked. He quit smokeless tobacco use about 22 years ago.  His smokeless  tobacco use included chew. He reports current alcohol use of about 3.0 standard drinks of alcohol per week. He reports that he does not use drugs.  Review of Systems: Ophthalmic: negative for eye pain, loss of vision or double vision Cardiovascular: negative for chest pain Respiratory: negative for SOB or persistent cough Gastrointestinal: negative for abdominal pain Genitourinary: negative for dysuria or gross hematuria MSK: negative for foot lesions Neurologic: negative for weakness or gait disturbance Current Meds  Medication Sig  . aspirin 81 MG chewable tablet Chew by mouth daily.  . B-D UF III MINI PEN NEEDLES 31G X 5 MM MISC See admin instructions.  . Cholecalciferol (VITAMIN D) 125 MCG (5000 UT) CAPS   . Cyanocobalamin (VITAMIN B12) 1000 MCG TBCR   . liraglutide (VICTOZA) 18 MG/3ML SOPN INJECT 1.8 MG UNDER THE SKIN ONCE DAILY  . lisinopril (ZESTRIL) 10 MG tablet Take 1 tablet (10 mg total) by mouth  daily.  . metFORMIN (GLUCOPHAGE) 500 MG tablet Take 1 tablet (500 mg total) by mouth 2 (two) times daily at 8 am and 10 pm.  . Omega-3 Fatty Acids (FISH OIL CONCENTRATE PO) Take by mouth.  . simvastatin (ZOCOR) 20 MG tablet Take 1 tablet (20 mg total) by mouth daily at 6 PM.  . tadalafil (CIALIS) 5 MG tablet TAKE 1 TABLET BY MOUTH DAILY AS NEEDED FOR ERECTILE DYSFUNCTION  . [DISCONTINUED] tadalafil (CIALIS) 5 MG tablet TAKE 1 TABLET BY MOUTH DAILY AS NEEDED FOR ERECTILE DYSFUNCTION    Objective  Vitals: BP 136/78 (BP Location: Right Arm, Patient Position: Sitting, Cuff Size: Normal)   Pulse 78   Temp (!) 97.5 F (36.4 C) (Temporal)   Ht 5\' 9"  (1.753 m)   Wt 240 lb 3.2 oz (109 kg)   SpO2 97%   BMI 35.47 kg/m  General: well appearing, no acute distress  Psych:  Alert and oriented, normal mood and affect HEENT:  Normocephalic, atraumatic, moist mucous membranes, supple neck  Cardiovascular:  Nl S1 and S2, RRR without murmur, gallop or rub. no edema Respiratory:  Good breath sounds bilaterally, CTAB with normal effort, no rales Skin:  Warm, no rashes Neurologic:   Mental status is normal Foot exam: no erythema, pallor, or cyanosis visible nl proprioception and sensation to monofilament testing bilaterally, +2 distal pulses bilaterally   Commons side effects, risks, benefits, and alternatives for medications and treatment plan prescribed today were discussed, and the patient expressed understanding of the given instructions. Patient is instructed to call or message via MyChart if he/she has any questions or concerns regarding our treatment plan. No barriers to understanding were identified. We discussed Red Flag symptoms and signs in detail. Patient expressed understanding regarding what to do in case of urgent or emergency type symptoms.   Medication list was reconciled, printed and provided to the patient in AVS. Patient instructions and summary information was reviewed with the patient as  documented in the AVS. This note was prepared with assistance of Dragon voice recognition software. Occasional wrong-word or sound-a-like substitutions may have occurred due to the inherent limitations of voice recognition software

## 2020-03-16 LAB — HM DIABETES EYE EXAM

## 2020-03-30 ENCOUNTER — Other Ambulatory Visit: Payer: Self-pay

## 2020-03-30 ENCOUNTER — Ambulatory Visit (INDEPENDENT_AMBULATORY_CARE_PROVIDER_SITE_OTHER): Payer: 59 | Admitting: Family Medicine

## 2020-03-30 ENCOUNTER — Telehealth: Payer: Self-pay | Admitting: Family Medicine

## 2020-03-30 ENCOUNTER — Encounter: Payer: Self-pay | Admitting: Family Medicine

## 2020-03-30 VITALS — BP 128/70 | HR 82 | Temp 97.9°F | Resp 18 | Ht 69.0 in | Wt 240.4 lb

## 2020-03-30 DIAGNOSIS — I152 Hypertension secondary to endocrine disorders: Secondary | ICD-10-CM

## 2020-03-30 DIAGNOSIS — I493 Ventricular premature depolarization: Secondary | ICD-10-CM | POA: Diagnosis not present

## 2020-03-30 DIAGNOSIS — E119 Type 2 diabetes mellitus without complications: Secondary | ICD-10-CM | POA: Diagnosis not present

## 2020-03-30 DIAGNOSIS — E1159 Type 2 diabetes mellitus with other circulatory complications: Secondary | ICD-10-CM

## 2020-03-30 DIAGNOSIS — I10 Essential (primary) hypertension: Secondary | ICD-10-CM

## 2020-03-30 LAB — COMPREHENSIVE METABOLIC PANEL
ALT: 22 U/L (ref 0–53)
AST: 20 U/L (ref 0–37)
Albumin: 4.5 g/dL (ref 3.5–5.2)
Alkaline Phosphatase: 60 U/L (ref 39–117)
BUN: 16 mg/dL (ref 6–23)
CO2: 28 mEq/L (ref 19–32)
Calcium: 9.8 mg/dL (ref 8.4–10.5)
Chloride: 104 mEq/L (ref 96–112)
Creatinine, Ser: 0.82 mg/dL (ref 0.40–1.50)
GFR: 98.92 mL/min (ref >60.00)
Glucose, Bld: 95 mg/dL (ref 70–99)
Potassium: 4.1 mEq/L (ref 3.5–5.1)
Sodium: 138 mEq/L (ref 135–145)
Total Bilirubin: 0.5 mg/dL (ref 0.2–1.2)
Total Protein: 6.7 g/dL (ref 6.0–8.3)

## 2020-03-30 LAB — CBC WITH DIFFERENTIAL/PLATELET
Basophils Absolute: 0.1 10*3/uL (ref 0.0–0.1)
Basophils Relative: 1.4 % (ref 0.0–3.0)
Eosinophils Absolute: 0.3 10*3/uL (ref 0.0–0.7)
Eosinophils Relative: 3.6 % (ref 0.0–5.0)
HCT: 45.3 % (ref 39.0–52.0)
Hemoglobin: 15.3 g/dL (ref 13.0–17.0)
Lymphocytes Relative: 33.6 % (ref 12.0–46.0)
Lymphs Abs: 2.4 10*3/uL (ref 0.7–4.0)
MCHC: 33.7 g/dL (ref 30.0–36.0)
MCV: 93.6 fl (ref 78.0–100.0)
Monocytes Absolute: 0.6 10*3/uL (ref 0.1–1.0)
Monocytes Relative: 8.7 % (ref 3.0–12.0)
Neutro Abs: 3.8 10*3/uL (ref 1.4–7.7)
Neutrophils Relative %: 52.7 % (ref 43.0–77.0)
Platelets: 222 10*3/uL (ref 150.0–400.0)
RBC: 4.84 Mil/uL (ref 4.22–5.81)
RDW: 13.1 % (ref 11.5–15.5)
WBC: 7.2 10*3/uL (ref 4.0–10.5)

## 2020-03-30 LAB — TSH: TSH: 1.28 u[IU]/mL (ref 0.35–4.50)

## 2020-03-30 NOTE — Telephone Encounter (Signed)
Echo -   Submit Clinical Request Patient ID: [id number] Time: Time: 03/30/2020 3:38 PM Patient Name: Gary Berry, Gary Berry Patient DOB: 03/18/1971 Physician Name: Billey Chang Physician Address: [place], [place] [address] Physician Tax HS:[id number] CPT Code: [phone] Case Number: [id number]  Thank you for submitting your request. There is no requirement for a notification, pre-certification or authorization for this procedure for this member's plan at this time.

## 2020-03-30 NOTE — Progress Notes (Signed)
Subjective  CC:  Chief Complaint  Patient presents with  . Irregular Heart Beat    Went to donate blood 3 weeks ago and was told that he had 15 incidents of irregular pulse. He has a ekg with him.     HPI: Gary Berry is a 49 y.o. male who presents to the office today to address the problems listed above in the chief complaint.  Feels fine but "extra heart beats" pretty consistently. Noted incidentally while donating blood. Wife did EKG at her office and confirmed ectopic beats. No palpitations, cp, lightheadedness, sweats, LE edema. Drinks caffeine daily. Eats a fair diet. Little exercise.   DM is well controlled.   No otc supplements   Assessment  1. PVC's (premature ventricular contractions)   2. Type 2 diabetes mellitus without complication, without long-term current use of insulin ([place])   3. Hypertension associated with diabetes ([place])      Plan   PVCs:  holter monitor and echocardiogram. To cards if needed.currently asymptomatic. Decrease caffeine  DM is controlled   Follow up: No follow-ups on file.  07/19/2020  Orders Placed This Encounter  Procedures  . TSH  . Comprehensive metabolic panel  . CBC with Differential/Platelet  . Holter monitor - 48 hour  . EKG 12-Lead  . ECHOCARDIOGRAM COMPLETE   No orders of the defined types were placed in this encounter.     I reviewed the patients updated PMH, FH, and SocHx.    Patient Active Problem List   Diagnosis Date Noted  . Type 2 diabetes mellitus without complication, without long-term current use of insulin ([place]) 11/21/2016  . Hypertension associated with diabetes ([place]) 11/21/2016  . Hyperlipidemia associated with type 2 diabetes mellitus ([place]) 11/21/2016  . 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 Obesity, Class II, BMI 35-39.9 11/21/2016  . Umbilical hernia 12/45/8099   Current Meds  Medication Sig  . aspirin 81 MG chewable tablet Chew by mouth daily.  . B-D UF III MINI PEN NEEDLES  31G X 5 MM MISC See admin instructions.  . Cholecalciferol (VITAMIN D) 125 MCG (5000 UT) CAPS   . Cyanocobalamin (VITAMIN B12) 1000 MCG TBCR   . liraglutide (VICTOZA) 18 MG/3ML SOPN INJECT 1.8 MG UNDER THE SKIN ONCE DAILY  . lisinopril (ZESTRIL) 10 MG tablet Take 1 tablet (10 mg total) by mouth daily.  . metFORMIN (GLUCOPHAGE) 500 MG tablet Take 1 tablet (500 mg total) by mouth 2 (two) times daily at 8 am and 10 pm.  . Omega-3 Fatty Acids (FISH OIL CONCENTRATE PO) Take by mouth.  . simvastatin (ZOCOR) 20 MG tablet Take 1 tablet (20 mg total) by mouth daily at 6 PM.  . tadalafil (CIALIS) 5 MG tablet TAKE 1 TABLET BY MOUTH DAILY AS NEEDED FOR ERECTILE DYSFUNCTION    Allergies: Patient has No Known Allergies. Family History: Patient family history includes Atrial fibrillation in his father; Colon polyps in his father; Diabetes in his mother; Hypertension in his mother. Social History:  Patient  reports that he has never smoked. He quit smokeless tobacco use about 22 years ago.  His smokeless tobacco use included chew. He reports current alcohol use of about 3.0 standard drinks of alcohol per week. He reports that he does not use drugs.  Review of Systems: Constitutional: Negative for fever malaise or anorexia Cardiovascular: negative for chest pain Respiratory: negative for SOB or persistent cough Gastrointestinal: negative for abdominal pain  Objective  Vitals: BP 128/70   Pulse 82  Temp 97.9 F (36.6 C) (Temporal)   Resp 18   Ht 5\' 9"  (1.753 m)   Wt 240 lb 6.4 oz (109 kg)   SpO2 98%   BMI 35.50 kg/m  General: no acute distress , A&Ox3 HEENT: PEERL, conjunctiva normal, neck is supple Cardiovascular:  RRR without murmur or gallop. Audible pvcs  Respiratory:  Good breath sounds bilaterally, CTAB with normal respiratory effort Skin:  Warm, no rashes  EKG: NSR with ventricular ectopic beats.    Commons side effects, risks, benefits, and alternatives for medications and  treatment plan prescribed today were discussed, and the patient expressed understanding of the given instructions. Patient is instructed to call or message via MyChart if he/she has any questions or concerns regarding our treatment plan. No barriers to understanding were identified. We discussed Red Flag symptoms and signs in detail. Patient expressed understanding regarding what to do in case of urgent or emergency type symptoms.   Medication list was reconciled, printed and provided to the patient in AVS. Patient instructions and summary information was reviewed with the patient as documented in the AVS. This note was prepared with assistance of Dragon voice recognition software. Occasional wrong-word or sound-a-like substitutions may have occurred due to the inherent limitations of voice recognition software  This visit occurred during the SARS-CoV-2 public health emergency.  Safety protocols were in place, including screening questions prior to the visit, additional usage of staff PPE, and extensive cleaning of exam room while observing appropriate contact time as indicated for disinfecting solutions.

## 2020-03-30 NOTE — Patient Instructions (Signed)
Please follow up as scheduled for your next visit with me: 07/19/2020   If you have any questions or concerns, please don't hesitate to send me a message via MyChart or call the office at [phone]. Thank you for visiting with [place] today! It's our pleasure caring for you.  We will call you to set you up for a Holter Monitor and Echocardiogram of your heart. Once we get the results, we can decide to refer you to a cardiologist if needed.  Decrease your caffeine intake. This may help.

## 2020-04-02 ENCOUNTER — Encounter: Payer: Self-pay | Admitting: Family Medicine

## 2020-04-08 ENCOUNTER — Other Ambulatory Visit (INDEPENDENT_AMBULATORY_CARE_PROVIDER_SITE_OTHER): Payer: 59

## 2020-04-08 DIAGNOSIS — I493 Ventricular premature depolarization: Secondary | ICD-10-CM | POA: Diagnosis not present

## 2020-04-19 ENCOUNTER — Other Ambulatory Visit: Payer: Self-pay

## 2020-04-19 ENCOUNTER — Ambulatory Visit (HOSPITAL_COMMUNITY): Payer: 59 | Attending: Cardiology

## 2020-04-19 DIAGNOSIS — I493 Ventricular premature depolarization: Secondary | ICD-10-CM | POA: Diagnosis not present

## 2020-04-19 MED ORDER — PERFLUTREN LIPID MICROSPHERE
1.0000 mL | INTRAVENOUS | Status: AC | PRN
  Administered 2020-04-19: 2 mL via INTRAVENOUS

## 2020-04-24 ENCOUNTER — Encounter: Payer: Self-pay | Admitting: Family Medicine

## 2020-04-24 ENCOUNTER — Other Ambulatory Visit: Payer: Self-pay

## 2020-04-24 DIAGNOSIS — I509 Heart failure, unspecified: Secondary | ICD-10-CM

## 2020-04-24 DIAGNOSIS — I5020 Unspecified systolic (congestive) heart failure: Secondary | ICD-10-CM

## 2020-04-24 HISTORY — DX: Unspecified systolic (congestive) heart failure: I50.20

## 2020-04-30 ENCOUNTER — Other Ambulatory Visit: Payer: Self-pay

## 2020-04-30 ENCOUNTER — Encounter: Payer: Self-pay | Admitting: Neurology

## 2020-04-30 ENCOUNTER — Ambulatory Visit (INDEPENDENT_AMBULATORY_CARE_PROVIDER_SITE_OTHER): Payer: 59 | Admitting: Neurology

## 2020-04-30 VITALS — BP 122/76 | HR 89 | Ht 69.0 in | Wt 238.0 lb

## 2020-04-30 DIAGNOSIS — I493 Ventricular premature depolarization: Secondary | ICD-10-CM | POA: Diagnosis not present

## 2020-04-30 DIAGNOSIS — R351 Nocturia: Secondary | ICD-10-CM

## 2020-04-30 DIAGNOSIS — R0683 Snoring: Secondary | ICD-10-CM | POA: Diagnosis not present

## 2020-04-30 DIAGNOSIS — E669 Obesity, unspecified: Secondary | ICD-10-CM

## 2020-04-30 DIAGNOSIS — I5022 Chronic systolic (congestive) heart failure: Secondary | ICD-10-CM | POA: Diagnosis not present

## 2020-04-30 NOTE — Progress Notes (Signed)
Subjective:    Patient ID: Gary Berry is a 49 y.o. male.  HPI     History: Dear Dr. Everette Rank,   I saw your patient, Gary Berry, upon your kind request, in my sleep clinic today for initial consultation of his sleep disorder, in particular, concern for underlying obstructive sleep apnea.  The patient is unaccompanied today.  As you know, [name] is a 49 year old right-handed gentleman with an underlying medical history of hypertension, diabetes, hyperlipidemia, chronic shoulder pain, chronic systolic congestive heart failure and obesity, who reports snoring and some sleep disruption.  He was recently found to have worsening left ventricular function with an EF estimated around 30 to 40%.  He was recently found to have PVCs.  He is scheduled to see cardiology tomorrow.  He denies any chest pain, shortness of breath or palpitations.  He has nocturia about once or twice per average night, denies recurrent morning headaches.  Father has sleep apnea and has a CPAP machine and recently also had open heart surgery for quadruple bypass.  Patient reports going to bed around 830 but does not typically fall asleep until 10 or even 11, rise time is around 7.  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  He lives with his wife, currently have 4 cats in the household, their son is grown, age 30.  [name], has reduced his caffeine intake recently and limits himself currently to 1 cup of coffee per day.  He drinks alcohol in the form of beer or wine, 6 or 7 servings per average week. His Epworth sleepiness score 4 out of 24, fatigue severity score is 17/63.   His Past Medical History Is Significant For: Past Medical History:  Diagnosis Date  . Chronic right shoulder pain, requiring injection prn 03/20/2017  . 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 Hyperlipidemia   . Hyperlipidemia associated with type 2 diabetes mellitus  ([place]) 11/21/2016  . Hypertension associated with diabetes ([place]) 11/21/2016  . Obesity, Class II, BMI 35-39.9 11/21/2016  . Systolic congestive heart failure with reduced left ventricular function, NYHA class 2 ([place]) 04/24/2020   Echocardiogram 04/2020:  Ef 35-40%, global hypokinesis  . Type 2 diabetes mellitus without complication, without long-term current use of insulin ([place]) 11/21/2016    His Past Surgical History Is Significant For: Past Surgical History:  Procedure Laterality Date  . HERNIA REPAIR  2016   hard to wake up after sx  . TONSILLECTOMY  1975  . VASECTOMY      His Family History Is Significant For: Family History  Problem Relation Age of Onset  . Diabetes Mother   . Hypertension Mother   . Atrial fibrillation Father   . Colon polyps Father   . Colon cancer Neg Hx   . Esophageal cancer Neg Hx   . Rectal cancer Neg Hx   . Stomach cancer Neg Hx     His Social History Is Significant For: Social History   Socioeconomic History  . Marital status: Married    Spouse name: Dennard Nip, MD  . Number of children: 1  . Years of education: Not on file  . Highest education level: Not on file  Occupational History    Employer: [employer]  Tobacco Use  . Smoking status: Never Smoker  . Smokeless tobacco: Former Systems developer    Types: Secondary school teacher  . Vaping Use: Never used  Substance and  Sexual Activity  . Alcohol use: Yes    Alcohol/week: 3.0 standard drinks    Types: 3 Standard drinks or equivalent per week    Comment: social  . Drug use: No  . Sexual activity: Yes    Partners: Female  Other Topics Concern  . Not on file  Social History Narrative  . Not on file   Social Determinants of Health   Financial Resource Strain:   . Difficulty of Paying Living Expenses:   Food Insecurity:   . Worried About Charity fundraiser in the Last Year:   . Arboriculturist in the Last Year:   Transportation Needs:   . Film/video editor (Medical):   Marland Kitchen Lack of Transportation  (Non-Medical):   Physical Activity:   . Days of Exercise per Week:   . Minutes of Exercise per Session:   Stress:   . Feeling of Stress :   Social Connections:   . Frequency of Communication with Friends and Family:   . Frequency of Social Gatherings with Friends and Family:   . Attends Religious Services:   . Active Member of Clubs or Organizations:   . Attends Archivist Meetings:   Marland Kitchen Marital Status:     His Allergies Are:  No Known Allergies:   His Current Medications Are:  Outpatient Encounter Medications as of 04/30/2020  Medication Sig  . aspirin 81 MG chewable tablet Chew by mouth daily.  . B-D UF III MINI PEN NEEDLES 31G X 5 MM MISC See admin instructions.  . Cholecalciferol (VITAMIN D) 125 MCG (5000 UT) CAPS   . Cyanocobalamin (VITAMIN B12) 1000 MCG TBCR   . liraglutide (VICTOZA) 18 MG/3ML SOPN INJECT 1.8 MG UNDER THE SKIN ONCE DAILY  . lisinopril (ZESTRIL) 10 MG tablet Take 1 tablet (10 mg total) by mouth daily.  . metFORMIN (GLUCOPHAGE) 500 MG tablet Take 1 tablet (500 mg total) by mouth 2 (two) times daily at 8 am and 10 pm.  . Omega-3 Fatty Acids (FISH OIL CONCENTRATE PO) Take by mouth.  . simvastatin (ZOCOR) 20 MG tablet Take 1 tablet (20 mg total) by mouth daily at 6 PM.  . tadalafil (CIALIS) 5 MG tablet TAKE 1 TABLET BY MOUTH DAILY AS NEEDED FOR ERECTILE DYSFUNCTION   No facility-administered encounter medications on file as of 04/30/2020.  :  Review of Systems:  Out of a complete 14 point review of systems, all are reviewed and negative with the exception of these symptoms as listed below:  Review of Systems  Neurological:       Here for sleep consult. No prior sleep study. Pt reports snoring is present at night.   Epworth Sleepiness Scale 0= would never doze 1= slight chance of dozing 2= moderate chance of dozing 3= high chance of dozing  Sitting and reading:0 Watching TV:0 Sitting inactive in a public place (ex. Theater or meeting):1 As a  passenger in a car for an hour without a break:0 Lying down to rest in the afternoon:3 Sitting and talking to someone:0 Sitting quietly after lunch (no alcohol):0 In a car, while stopped in traffic:0 Total:4     Objective:  Neurological Exam  Physical Exam Physical Examination:   Vitals:   04/30/20 0851  BP: 122/76  Pulse: 89  SpO2: 97%    General Examination: The patient is a very pleasant 49 y.o. male in no acute distress. He appears well-developed and well-nourished and well groomed.   HEENT: Normocephalic, atraumatic, pupils are  equal, round and reactive to light, extraocular tracking is good without limitation to gaze excursion or nystagmus noted. Hearing is grossly intact. Face is symmetric with normal facial animation. Speech is clear with no dysarthria noted. There is no hypophonia. There is no lip, neck/head, jaw or voice tremor. Neck is supple with full range of passive and active motion. There are no carotid bruits on auscultation. Oropharynx exam reveals: no signif. mouth dryness, good dental hygiene and moderate airway crowding, due to larger uvula and wider tongue.  Tonsils absent, Mallampati class II.  Neck circumference of 17-7/8 inches.  Tonsils absent.  He has minimal to no overbite.  Chest: Clear to auscultation without wheezing, rhonchi or crackles noted.  Heart: S1+S2+0, regular and normal without murmurs, rubs or gallops noted.   Abdomen: Soft, non-tender and non-distended with normal bowel sounds appreciated on auscultation.  Extremities: There is no pitting edema in the distal lower extremities bilaterally.   Skin: Warm and dry without trophic changes noted.   Musculoskeletal: exam reveals no obvious joint deformities, tenderness or joint swelling or erythema.   Neurologically:  Mental status: The patient is awake, alert and oriented in all 4 spheres. His immediate and remote memory, attention, language skills and fund of knowledge are appropriate. There  is no evidence of aphasia, agnosia, apraxia or anomia. Speech is clear with normal prosody and enunciation. Thought process is linear. Mood is normal and affect is normal.  Cranial nerves II - XII are as described above under HEENT exam.  Motor exam: Normal bulk, strength and tone is noted. There is no tremor, Romberg is negative. Fine motor skills and coordination: grossly intact.  Cerebellar testing: No dysmetria or intention tremor. There is no truncal or gait ataxia.  Sensory exam: intact to light touch in the upper and lower extremities.  Gait, station and balance: He stands easily. No veering to one side is noted. No leaning to one side is noted. Posture is age-appropriate and stance is narrow based. Gait shows normal stride length and normal pace. No problems turning are noted. Tandem walk is unremarkable.                Assessment and Plan:  In summary, Estes Lehner is a very pleasant 49 y.o.-year old male with an underlying medical history of hypertension, diabetes, hyperlipidemia, chronic shoulder pain, chronic systolic congestive heart failure and obesity, whose history and physical exam are concerning for obstructive sleep apnea (OSA). I had a long chat with the patient about my findings and the diagnosis of OSA, its prognosis and treatment options. We talked about medical treatments, surgical interventions and non-pharmacological approaches. I explained in particular the risks and ramifications of untreated moderate to severe OSA, especially with respect to developing cardiovascular disease down the Road, including congestive heart failure, difficult to treat hypertension, cardiac arrhythmias, or stroke. Even type 2 diabetes has, in part, been linked to untreated OSA. Symptoms of untreated OSA include daytime sleepiness, memory problems, mood irritability and mood disorder such as depression and anxiety, lack of energy, as well as recurrent headaches, especially morning headaches. We talked  about trying to maintain a healthy lifestyle in general, as well as the importance of weight control. We also talked about the importance of good sleep hygiene. I recommended the following at this time: sleep study.   I explained the sleep test procedure to the patient and also outlined possible surgical and non-surgical treatment options of OSA, including the use of a custom-made dental device (which would  require a referral to a specialist dentist or oral surgeon), upper airway surgical options, such as traditional UPPP or a novel less invasive surgical option in the form of Inspire hypoglossal nerve stimulation (which would involve a referral to an ENT surgeon). I also explained the CPAP treatment option to the patient, who indicated that he would be willing to try CPAP if the need arises. I explained the importance of being compliant with PAP treatment, not only for insurance purposes but primarily to improve His symptoms, and for the patient's long term health benefit, including to reduce His cardiovascular risks. I answered all his questions today and the patient was in agreement. I plan to see him back after the sleep study is completed and encouraged him to call with any interim questions, concerns, problems or updates.   Thank you very much for allowing me to participate in the care of this nice patient. If I can be of any further assistance to you please do not hesitate to call me at [phone].  Sincerely,   Star Age, MD, PhD

## 2020-04-30 NOTE — Patient Instructions (Signed)

## 2020-05-01 ENCOUNTER — Ambulatory Visit (INDEPENDENT_AMBULATORY_CARE_PROVIDER_SITE_OTHER): Payer: 59 | Admitting: Internal Medicine

## 2020-05-01 ENCOUNTER — Other Ambulatory Visit: Payer: Self-pay

## 2020-05-01 ENCOUNTER — Encounter: Payer: Self-pay | Admitting: Internal Medicine

## 2020-05-01 VITALS — BP 132/80 | HR 72 | Ht 69.0 in | Wt 230.2 lb

## 2020-05-01 DIAGNOSIS — E782 Mixed hyperlipidemia: Secondary | ICD-10-CM | POA: Diagnosis not present

## 2020-05-01 DIAGNOSIS — I493 Ventricular premature depolarization: Secondary | ICD-10-CM

## 2020-05-01 DIAGNOSIS — Z01812 Encounter for preprocedural laboratory examination: Secondary | ICD-10-CM | POA: Diagnosis not present

## 2020-05-01 DIAGNOSIS — I5020 Unspecified systolic (congestive) heart failure: Secondary | ICD-10-CM

## 2020-05-01 DIAGNOSIS — I10 Essential (primary) hypertension: Secondary | ICD-10-CM

## 2020-05-01 MED ORDER — METOPROLOL SUCCINATE ER 25 MG PO TB24: 25.0000 mg | ORAL_TABLET | Freq: Every day | ORAL | 3 refills | Status: DC

## 2020-05-01 NOTE — H&P (View-Only) (Signed)
OFFICE CONSULT NOTE  Chief Complaint:  New onset heart failure  Primary Care Physician: Leamon Arnt, MD  HPI:  Gary Berry is a 49 y.o. male who is being seen today for the evaluation of heart failure at the request of Leamon Arnt, MD.  This is a pleasant 49 year old male whose wife is at [place].  He was referred for evaluation of newly recognized systolic congestive heart failure.  Apparently he had been having some palpitations which were evaluated by his PCP.  An echocardiogram was ordered as well as a Holter monitor.  He was found to have some PVCs on and off his EKG.  The echocardiogram was performed on 04/19/2020 and indicated an LVEF of 35 to 40% with global hypokinesis and moderate LVH, grade 1 diastolic dysfunction and moderate RV systolic dysfunction.  Pressure and IVC were normal with moderate left atrial enlargement.  Despite these findings, Gary Berry has no heart failure symptoms.  He denies worsening shortness of breath, orthopnea, lower extremity edema, chest pain, presyncope, syncopal episodes or any other associated symptoms.  He has had palpitations as mentioned and was noted to have PVCs.  The Holter monitor result is not yet available to determine his burden of PVCs.  According to his wife he also has a history of some alcohol use and was drinking about 3-4 drinks a night which had picked up since the beginning of the Covid pandemic.  Apparently before that it was much less.  He has recently decreased this.  In addition he was described as a very heavy caffeine user and recently has been decreasing that as well.  There is heart disease in his family including his father who had heart disease in his 108s.  He also has type 2 diabetes without insulin use, hypertension and mild obesity.  Recent hemoglobin A1c was 5.3.  Direct LDL in October was 69, however triglycerides were elevated at 273.  PMHx:  Past Medical History:  Diagnosis Date  . Chronic right  shoulder pain, requiring injection prn 03/20/2017  . 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 Hyperlipidemia   . Hyperlipidemia associated with type 2 diabetes mellitus ([place]) 11/21/2016  . Hypertension associated with diabetes ([place]) 11/21/2016  . Obesity, Class II, BMI 35-39.9 11/21/2016  . Systolic congestive heart failure with reduced left ventricular function, NYHA class 2 ([place]) 04/24/2020   Echocardiogram 04/2020:  Ef 35-40%, global hypokinesis  . Type 2 diabetes mellitus without complication, without long-term current use of insulin ([place]) 11/21/2016    Past Surgical History:  Procedure Laterality Date  . HERNIA REPAIR  2016   hard to wake up after sx  . TONSILLECTOMY  1975  . VASECTOMY      FAMHx:  Family History  Problem Relation Age of Onset  . Diabetes Mother   . Hypertension Mother   . Atrial fibrillation Father   . Colon polyps Father   . Colon cancer Neg Hx   . Esophageal cancer Neg Hx   . Rectal cancer Neg Hx   . Stomach cancer Neg Hx     SOCHx:   reports that he has never smoked. He quit smokeless tobacco use about 22 years ago.  His smokeless tobacco use included chew. He reports current alcohol use of about 3.0 standard drinks of alcohol per week. He reports that he does not use drugs.  ALLERGIES:  No Known Allergies  ROS: Pertinent items noted in HPI and remainder of comprehensive ROS otherwise  negative.  HOME MEDS: Current Outpatient Medications on File Prior to Visit  Medication Sig Dispense Refill  . B-D UF III MINI PEN NEEDLES 31G X 5 MM MISC See admin instructions.    . Cholecalciferol (VITAMIN D) 125 MCG (5000 UT) CAPS Take 5,000 Units by mouth daily.     . Cyanocobalamin (VITAMIN B12) 1000 MCG TBCR Take 1,000 mcg by mouth daily.     Marland Kitchen liraglutide (VICTOZA) 18 MG/3ML SOPN INJECT 1.8 MG UNDER THE SKIN ONCE DAILY (Patient taking differently: Inject 1.8 mg into the skin daily. ) 9 pen 11  . lisinopril (ZESTRIL) 10 MG tablet Take 1 tablet (10 mg  total) by mouth daily. 90 tablet 3  . metFORMIN (GLUCOPHAGE) 500 MG tablet Take 1 tablet (500 mg total) by mouth 2 (two) times daily at 8 am and 10 pm. 180 tablet 4  . simvastatin (ZOCOR) 20 MG tablet Take 1 tablet (20 mg total) by mouth daily at 6 PM. 90 tablet 3  . tadalafil (CIALIS) 5 MG tablet TAKE 1 TABLET BY MOUTH DAILY AS NEEDED FOR ERECTILE DYSFUNCTION (Patient taking differently: Take 5 mg by mouth daily as needed for erectile dysfunction. ) 90 tablet 1   No current facility-administered medications on file prior to visit.    LABS/IMAGING: No results found for this or any previous visit (from the past 48 hour(s)). No results found.  LIPID PANEL:    Component Value Date/Time   CHOL 147 08/02/2019 0919   TRIG 273.0 (H) 08/02/2019 0919   HDL 46.10 08/02/2019 0919   CHOLHDL 3 08/02/2019 0919   VLDL 54.6 (H) 08/02/2019 0919   LDLCALC 68 02/21/2019 0904   LDLDIRECT 69.0 08/02/2019 0919    WEIGHTS: Wt Readings from Last 3 Encounters:  05/01/20 230 lb 3.2 oz (104.4 kg)  04/30/20 238 lb (108 kg)  03/30/20 240 lb 6.4 oz (109 kg)    VITALS: BP 132/80   Pulse 72   Ht 5\' 9"  (1.753 m)   Wt 230 lb 3.2 oz (104.4 kg)   BMI 33.99 kg/m   EXAM: General appearance: alert, no distress and mildly obese Neck: no carotid bruit, no JVD and thyroid not enlarged, symmetric, no tenderness/mass/nodules Lungs: clear to auscultation bilaterally Heart: regular rate and rhythm, S1, S2 normal, no murmur, click, rub or gallop Abdomen: soft, non-tender; bowel sounds normal; no masses,  no organomegaly Extremities: extremities normal, atraumatic, no cyanosis or edema Pulses: 2+ and symmetric Skin: Skin color, texture, turgor normal. No rashes or lesions Neurologic: Grossly normal Psych: pleasant  EKG: Sinus rhythm with PVCs at 72, RBBB- personally reviewed  ASSESSMENT: 1. Acute systolic heart failure, LVEF 35 to 40% (NYHA class I-II symptoms) 2. 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 Recent moderate daily alcohol and heavy caffeine use 4. Family history of coronary disease 5. Type II 2 diabetes -A1c 5.3 6. Hypertension 7. Dyslipidemia  PLAN: 1.   Gary Berry has newly recognized systolic heart failure but no evidence of decompensation or congestion.  LVEF was 35 to 40% with global hypokinesis on echo.  He is having some PVCs and palpitations which question whether or not he could have a PVC related cardiomyopathy.  It is important to see what his burden of PVCs are on his recent Holter monitor.  His wife noted recent moderate daily alcohol use as well as heavy caffeine use, both of which are being pared down.  There is a family history of coronary disease and he has type 2 diabetes rather  is on medications to help with weight loss and glycemic control and A1c is excellent at 5.3.  He also has well-controlled hypertension and dyslipidemia with primarily elevated triglycerides.  Doubt an ischemic cause of his cardiomyopathy although he has no angina.  We discussed options including a definitive cardiac catheterization versus a CT coronary angiogram however given his frequent ectopy I am concerned that we may have a artifactual or nondiagnostic study.  Therefore I am recommending left and right heart catheterization.  I did discuss the risk, benefits and alternatives of this procedure with him and his wife today and they understand those risks and are willing to proceed.  In addition, I am recommending starting low-dose Toprol-XL 25 mg daily to see if we can suppress his PVCs and after his catheterization would recommend weaning off of lisinopril and transitioning to low-dose Entresto 24/26 mg twice daily.  Thanks again for the kind referral.  Pixie Casino, MD, FACC, [place] Director of the [facility]  [facility] Diplomate of the American Board of Clinical Lipidology Attending  Cardiologist  Direct Dial: [phone]  Fax: [phone]  Website:  [email or web address] 05/01/2020, 6:08 PM

## 2020-05-01 NOTE — Patient Instructions (Addendum)
Medication Instructions:  START metoprolol succinate (Toprol XL) daily  *If you need a refill on your cardiac medications before your next appointment, please call your pharmacy*   Lab Work: [place] - pre-procedure lab work to be completed today (7/13) or tomorrow (7/14) If you have labs (blood work) drawn today and your tests are completely normal, you will receive your results only by:  Gary Berry (if you have MyChart) OR  A paper copy in the mail If you have any lab test that is abnormal or we need to change your treatment, we will call you to review the results.   Testing/Procedures: RIGHT & LEFT Heart Care @ [facility] on Friday July 16 with Dr. Ellyn Hack    Follow-Up: At [facility], you and your health needs are our priority.  As part of our continuing mission to provide you with exceptional heart care, we have created designated Provider Care Teams.  These Care Teams include your primary Cardiologist (physician) and Advanced Practice Providers (APPs -  Physician Assistants and Nurse Practitioners) who all work together to provide you with the care you need, when you need it.  We recommend signing up for the patient portal called "MyChart".  Sign up information is provided on this After Visit Summary.  MyChart is used to connect with patients for Virtual Visits (Telemedicine).  Patients are able to view lab/test results, encounter notes, upcoming appointments, etc.  Non-urgent messages can be sent to your provider as well.   To learn more about what you can do with MyChart, go to [email or web address].    Your next appointment:   1-2 months (OK to double book)  The format for your next appointment:   In Person  Provider:   K. Mali Hilty, MD   Other Instructions     [address] [place] [address] Dept: [phone] Loc: [place]  05/01/2020  You are scheduled for a Cardiac Catheterization on Friday, July 16 with Dr. Glenetta Hew.  1. Please arrive at the [facility] (Main Entrance A) at [facility]: [address] [place], [place] [address] at 5:30 AM (This time is two hours before your procedure to ensure your preparation). Free valet parking service is available.   Special note: Every effort is made to have your procedure done on time. Please understand that emergencies sometimes delay scheduled procedures.  2. Diet: Do not eat solid foods after midnight.  The patient may have clear liquids until 5am upon the day of the procedure.  3. Labs: You will need to have blood drawn either today 05/01/2020 or tomorrow 05/02/2020  4. Medication instructions in preparation for your procedure:  Do not take Victoza the day of procedure  Do not take Diabetes Med Glucophage (Metformin) on the day of the procedure and HOLD 48 HOURS AFTER THE PROCEDURE.  On the morning of your procedure, take your Aspirin and any morning medicines NOT listed above.  You may use sips of water.  5. Plan for one night stay--bring personal belongings. 6. Bring a current list of your medications and current insurance cards. 7. You MUST have a responsible person to drive you home. 8. Someone MUST be with you the first 24 hours after you arrive home or your discharge will be delayed. 9. Please wear clothes that are easy to get on and off and wear slip-on shoes.  Thank you for allowing [place] to  care for you!   -- [facility]

## 2020-05-01 NOTE — Progress Notes (Signed)
OFFICE CONSULT NOTE  Chief Complaint:  New onset heart failure  Primary Care Physician: Leamon Arnt, MD  HPI:  Gary Berry is a 49 y.o. male who is being seen today for the evaluation of heart failure at the request of Leamon Arnt, MD.  This is a pleasant 49 year old male whose wife is at [place].  He was referred for evaluation of newly recognized systolic congestive heart failure.  Apparently he had been having some palpitations which were evaluated by his PCP.  An echocardiogram was ordered as well as a Holter monitor.  He was found to have some PVCs on and off his EKG.  The echocardiogram was performed on 04/19/2020 and indicated an LVEF of 35 to 40% with global hypokinesis and moderate LVH, grade 1 diastolic dysfunction and moderate RV systolic dysfunction.  Pressure and IVC were normal with moderate left atrial enlargement.  Despite these findings, Gary Berry has no heart failure symptoms.  He denies worsening shortness of breath, orthopnea, lower extremity edema, chest pain, presyncope, syncopal episodes or any other associated symptoms.  He has had palpitations as mentioned and was noted to have PVCs.  The Holter monitor result is not yet available to determine his burden of PVCs.  According to his wife he also has a history of some alcohol use and was drinking about 3-4 drinks a night which had picked up since the beginning of the Covid pandemic.  Apparently before that it was much less.  He has recently decreased this.  In addition he was described as a very heavy caffeine user and recently has been decreasing that as well.  There is heart disease in his family including his father who had heart disease in his 38s.  He also has type 2 diabetes without insulin use, hypertension and mild obesity.  Recent hemoglobin A1c was 5.3.  Direct LDL in October was 69, however triglycerides were elevated at 273.  PMHx:  Past Medical History:  Diagnosis Date  . Chronic right  shoulder pain, requiring injection prn 03/20/2017  . 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 Hyperlipidemia   . Hyperlipidemia associated with type 2 diabetes mellitus ([place]) 11/21/2016  . Hypertension associated with diabetes ([place]) 11/21/2016  . Obesity, Class II, BMI 35-39.9 11/21/2016  . Systolic congestive heart failure with reduced left ventricular function, NYHA class 2 ([place]) 04/24/2020   Echocardiogram 04/2020:  Ef 35-40%, global hypokinesis  . Type 2 diabetes mellitus without complication, without long-term current use of insulin ([place]) 11/21/2016    Past Surgical History:  Procedure Laterality Date  . HERNIA REPAIR  2016   hard to wake up after sx  . TONSILLECTOMY  1975  . VASECTOMY      FAMHx:  Family History  Problem Relation Age of Onset  . Diabetes Mother   . Hypertension Mother   . Atrial fibrillation Father   . Colon polyps Father   . Colon cancer Neg Hx   . Esophageal cancer Neg Hx   . Rectal cancer Neg Hx   . Stomach cancer Neg Hx     SOCHx:   reports that he has never smoked. He quit smokeless tobacco use about 22 years ago.  His smokeless tobacco use included chew. He reports current alcohol use of about 3.0 standard drinks of alcohol per week. He reports that he does not use drugs.  ALLERGIES:  No Known Allergies  ROS: Pertinent items noted in HPI and remainder of comprehensive ROS otherwise  negative.  HOME MEDS: Current Outpatient Medications on File Prior to Visit  Medication Sig Dispense Refill  . B-D UF III MINI PEN NEEDLES 31G X 5 MM MISC See admin instructions.    . Cholecalciferol (VITAMIN D) 125 MCG (5000 UT) CAPS Take 5,000 Units by mouth daily.     . Cyanocobalamin (VITAMIN B12) 1000 MCG TBCR Take 1,000 mcg by mouth daily.     [place] [facility] liraglutide (VICTOZA) 18 MG/3ML SOPN INJECT 1.8 MG UNDER THE SKIN ONCE DAILY (Patient taking differently: Inject 1.8 mg into the skin daily. ) 9 pen 11  . lisinopril (ZESTRIL) 10 MG tablet Take 1 tablet (10 mg  total) by mouth daily. 90 tablet 3  . metFORMIN (GLUCOPHAGE) 500 MG tablet Take 1 tablet (500 mg total) by mouth 2 (two) times daily at 8 am and 10 pm. 180 tablet 4  . simvastatin (ZOCOR) 20 MG tablet Take 1 tablet (20 mg total) by mouth daily at 6 PM. 90 tablet 3  . tadalafil (CIALIS) 5 MG tablet TAKE 1 TABLET BY MOUTH DAILY AS NEEDED FOR ERECTILE DYSFUNCTION (Patient taking differently: Take 5 mg by mouth daily as needed for erectile dysfunction. ) 90 tablet 1   No current facility-administered medications on file prior to visit.    LABS/IMAGING: No results found for this or any previous visit (from the past 48 hour(s)). No results found.  LIPID PANEL:    Component Value Date/Time   CHOL 147 08/02/2019 0919   TRIG 273.0 (H) 08/02/2019 0919   HDL 46.10 08/02/2019 0919   CHOLHDL 3 08/02/2019 0919   VLDL 54.6 (H) 08/02/2019 0919   LDLCALC 68 02/21/2019 0904   LDLDIRECT 69.0 08/02/2019 0919    WEIGHTS: Wt Readings from Last 3 Encounters:  05/01/20 230 lb 3.2 oz (104.4 kg)  04/30/20 238 lb (108 kg)  03/30/20 240 lb 6.4 oz (109 kg)    VITALS: BP 132/80   Pulse 72   Ht 5\' 9"  (1.753 m)   Wt 230 lb 3.2 oz (104.4 kg)   BMI 33.99 kg/m   EXAM: General appearance: alert, no distress and mildly obese Neck: no carotid bruit, no JVD and thyroid not enlarged, symmetric, no tenderness/mass/nodules Lungs: clear to auscultation bilaterally Heart: regular rate and rhythm, S1, S2 normal, no murmur, click, rub or gallop Abdomen: soft, non-tender; bowel sounds normal; no masses,  no organomegaly Extremities: extremities normal, atraumatic, no cyanosis or edema Pulses: 2+ and symmetric Skin: Skin color, texture, turgor normal. No rashes or lesions Neurologic: Grossly normal Psych: pleasant  EKG: Sinus rhythm with PVCs at 72, RBBB- personally reviewed  ASSESSMENT: 1. Acute systolic heart failure, LVEF 35 to 40% (NYHA class I-II symptoms) 2. 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 Recent moderate daily alcohol and heavy caffeine use 4. Family history of coronary disease 5. Type II 2 diabetes -A1c 5.3 6. Hypertension 7. Dyslipidemia  PLAN: 1.   Gary Berry has newly recognized systolic heart failure but no evidence of decompensation or congestion.  LVEF was 35 to 40% with global hypokinesis on echo.  He is having some PVCs and palpitations which question whether or not he could have a PVC related cardiomyopathy.  It is important to see what his burden of PVCs are on his recent Holter monitor.  His wife noted recent moderate daily alcohol use as well as heavy caffeine use, both of which are being pared down.  There is a family history of coronary disease and he has type 2 diabetes rather  is on medications to help with weight loss and glycemic control and A1c is excellent at 5.3.  He also has well-controlled hypertension and dyslipidemia with primarily elevated triglycerides.  Doubt an ischemic cause of his cardiomyopathy although he has no angina.  We discussed options including a definitive cardiac catheterization versus a CT coronary angiogram however given his frequent ectopy I am concerned that we may have a artifactual or nondiagnostic study.  Therefore I am recommending left and right heart catheterization.  I did discuss the risk, benefits and alternatives of this procedure with him and his wife today and they understand those risks and are willing to proceed.  In addition, I am recommending starting low-dose Toprol-XL 25 mg daily to see if we can suppress his PVCs and after his catheterization would recommend weaning off of lisinopril and transitioning to low-dose Entresto 24/26 mg twice daily.  Thanks again for the kind referral.  Pixie Casino, MD, FACC, [place] Director of the [facility] Diplomate of the American Board of Clinical Lipidology Attending  Cardiologist  Direct Dial: [phone]  Fax: [phone]  Website:  [email or web address] 05/01/2020, 6:08 PM

## 2020-05-02 ENCOUNTER — Other Ambulatory Visit: Payer: Self-pay | Admitting: Cardiology

## 2020-05-02 DIAGNOSIS — E782 Mixed hyperlipidemia: Secondary | ICD-10-CM

## 2020-05-02 DIAGNOSIS — I5020 Unspecified systolic (congestive) heart failure: Secondary | ICD-10-CM

## 2020-05-02 DIAGNOSIS — I493 Ventricular premature depolarization: Secondary | ICD-10-CM

## 2020-05-02 DIAGNOSIS — I10 Essential (primary) hypertension: Secondary | ICD-10-CM

## 2020-05-02 LAB — BASIC METABOLIC PANEL
BUN/Creatinine Ratio: 20 (ref 9–20)
BUN: 18 mg/dL (ref 6–24)
CO2: 22 mmol/L (ref 20–29)
Calcium: 9.6 mg/dL (ref 8.7–10.2)
Chloride: 104 mmol/L (ref 96–106)
Creatinine, Ser: 0.89 mg/dL (ref 0.76–1.27)
GFR calc Af Amer: 114 mL/min/{1.73_m2} (ref >59)
GFR calc non Af Amer: 99 mL/min/{1.73_m2} (ref >59)
Glucose: 85 mg/dL (ref 65–99)
Potassium: 4.6 mmol/L (ref 3.5–5.2)
Sodium: 139 mmol/L (ref 134–144)

## 2020-05-02 LAB — CBC
Hematocrit: 46.1 % (ref 37.5–51.0)
Hemoglobin: 15.7 g/dL (ref 13.0–17.7)
MCH: 30.5 pg (ref 26.6–33.0)
MCHC: 34.1 g/dL (ref 31.5–35.7)
MCV: 90 fL (ref 79–97)
Platelets: 248 10*3/uL (ref 150–450)
RBC: 5.14 x10E6/uL (ref 4.14–5.80)
RDW: 12.6 % (ref 11.6–15.4)
WBC: 7.7 10*3/uL (ref 3.4–10.8)

## 2020-05-03 ENCOUNTER — Telehealth: Payer: Self-pay | Admitting: *Deleted

## 2020-05-03 NOTE — Telephone Encounter (Signed)
Pt contacted pre-catheterization scheduled at [facility] for: Friday May 04, 2020 7:30 AM Verified arrival time and place: [facility]) at: 5:30 AM   No solid food after midnight prior to cath, clear liquids until 5 AM day of procedure.  Hold: Metformin-day of procedure and 48 hours post procedure Victoza-AM of procedure Cialis-until post procedure   Except hold medications AM meds can be  taken pre-cath with sips of water including: ASA 81 mg   Confirmed patient has responsible adult to drive home post procedure and observe 24 hours after arriving home: yes  You are allowed ONE visitor in the waiting room during your procedure. Both you and your visitor must wear a mask once you enter the hospital.      COVID-19 Pre-Screening Questions:   In the past 7 to 10 days have you had a new cough, shortness of breath, headache, congestion, fever (100 or greater) unexplained body aches, new sore throat, or sudden loss of taste or sense of smell? no  In the past 7 to 10 days have you been around anyone with known Covid 19? No  Procedure/mask/visitor instructions, COVID-19 screening questions reviewed with patient.  Pt is fully vaccinated for COVID-19, see immunization history.

## 2020-05-04 ENCOUNTER — Encounter (HOSPITAL_COMMUNITY)
Admission: RE | Disposition: A | Discharge status: Home / Self Care | Payer: Self-pay | Source: Home / Self Care | Attending: Cardiology

## 2020-05-04 ENCOUNTER — Other Ambulatory Visit: Payer: Self-pay

## 2020-05-04 ENCOUNTER — Ambulatory Visit (HOSPITAL_COMMUNITY)
Admission: RE | Admit: 2020-05-04 | Discharge: 2020-05-04 | Disposition: A | Discharge status: Home / Self Care | Payer: 59 | Attending: Cardiology | Admitting: Cardiology

## 2020-05-04 ENCOUNTER — Encounter (HOSPITAL_COMMUNITY): Payer: Self-pay | Admitting: Cardiology

## 2020-05-04 DIAGNOSIS — I110 Hypertensive heart disease with heart failure: Secondary | ICD-10-CM | POA: Insufficient documentation

## 2020-05-04 DIAGNOSIS — I493 Ventricular premature depolarization: Secondary | ICD-10-CM | POA: Diagnosis present

## 2020-05-04 DIAGNOSIS — I5022 Chronic systolic (congestive) heart failure: Secondary | ICD-10-CM | POA: Diagnosis not present

## 2020-05-04 DIAGNOSIS — I5020 Unspecified systolic (congestive) heart failure: Secondary | ICD-10-CM | POA: Diagnosis present

## 2020-05-04 DIAGNOSIS — Z8249 Family history of ischemic heart disease and other diseases of the circulatory system: Secondary | ICD-10-CM | POA: Insufficient documentation

## 2020-05-04 DIAGNOSIS — E782 Mixed hyperlipidemia: Secondary | ICD-10-CM

## 2020-05-04 DIAGNOSIS — E669 Obesity, unspecified: Secondary | ICD-10-CM | POA: Insufficient documentation

## 2020-05-04 DIAGNOSIS — I10 Essential (primary) hypertension: Secondary | ICD-10-CM

## 2020-05-04 DIAGNOSIS — E785 Hyperlipidemia, unspecified: Secondary | ICD-10-CM | POA: Diagnosis not present

## 2020-05-04 DIAGNOSIS — Z6835 Body mass index (BMI) 35.0-35.9, adult: Secondary | ICD-10-CM | POA: Insufficient documentation

## 2020-05-04 DIAGNOSIS — Z79899 Other long term (current) drug therapy: Secondary | ICD-10-CM | POA: Diagnosis not present

## 2020-05-04 DIAGNOSIS — Z794 Long term (current) use of insulin: Secondary | ICD-10-CM | POA: Insufficient documentation

## 2020-05-04 DIAGNOSIS — E119 Type 2 diabetes mellitus without complications: Secondary | ICD-10-CM | POA: Insufficient documentation

## 2020-05-04 HISTORY — PX: RIGHT/LEFT HEART CATH AND CORONARY ANGIOGRAPHY: CATH118266

## 2020-05-04 LAB — POCT I-STAT 7, (LYTES, BLD GAS, ICA,H+H)
Acid-Base Excess: 0 mmol/L (ref 0.0–2.0)
Bicarbonate: 26.6 mmol/L (ref 20.0–28.0)
Calcium, Ion: 1.29 mmol/L (ref 1.15–1.40)
HCT: 43 % (ref 39.0–52.0)
Hemoglobin: 14.6 g/dL (ref 13.0–17.0)
O2 Saturation: 95 %
Potassium: 4.1 mmol/L (ref 3.5–5.1)
Sodium: 142 mmol/L (ref 135–145)
TCO2: 28 mmol/L (ref 22–32)
pCO2 arterial: 47.8 mmHg (ref 32.0–48.0)
pH, Arterial: 7.354 (ref 7.350–7.450)
pO2, Arterial: 80 mmHg — ABNORMAL LOW (ref 83.0–108.0)

## 2020-05-04 LAB — POCT I-STAT EG7
Acid-Base Excess: 0 mmol/L (ref 0.0–2.0)
Acid-Base Excess: 0 mmol/L (ref 0.0–2.0)
Bicarbonate: 26.5 mmol/L (ref 20.0–28.0)
Bicarbonate: 27.1 mmol/L (ref 20.0–28.0)
Calcium, Ion: 1.19 mmol/L (ref 1.15–1.40)
Calcium, Ion: 1.28 mmol/L (ref 1.15–1.40)
HCT: 43 % (ref 39.0–52.0)
HCT: 43 % (ref 39.0–52.0)
Hemoglobin: 14.6 g/dL (ref 13.0–17.0)
Hemoglobin: 14.6 g/dL (ref 13.0–17.0)
O2 Saturation: 73 %
O2 Saturation: 75 %
Potassium: 4.1 mmol/L (ref 3.5–5.1)
Potassium: 4.1 mmol/L (ref 3.5–5.1)
Sodium: 143 mmol/L (ref 135–145)
Sodium: 145 mmol/L (ref 135–145)
TCO2: 28 mmol/L (ref 22–32)
TCO2: 29 mmol/L (ref 22–32)
pCO2, Ven: 49.7 mmHg (ref 44.0–60.0)
pCO2, Ven: 51.1 mmHg (ref 44.0–60.0)
pH, Ven: 7.332 (ref 7.250–7.430)
pH, Ven: 7.336 (ref 7.250–7.430)
pO2, Ven: 42 mmHg (ref 32.0–45.0)
pO2, Ven: 43 mmHg (ref 32.0–45.0)

## 2020-05-04 LAB — GLUCOSE, CAPILLARY: Glucose-Capillary: 103 mg/dL — ABNORMAL HIGH (ref 70–99)

## 2020-05-04 SURGERY — RIGHT/LEFT HEART CATH AND CORONARY ANGIOGRAPHY
Anesthesia: LOCAL

## 2020-05-04 MED ORDER — HYDRALAZINE HCL 20 MG/ML IJ SOLN: 10.0000 mg | INTRAMUSCULAR | Status: DC | PRN

## 2020-05-04 MED ORDER — MIDAZOLAM HCL 2 MG/2ML IJ SOLN
INTRAMUSCULAR | Status: AC
  Filled 2020-05-04: qty 2

## 2020-05-04 MED ORDER — SODIUM CHLORIDE 0.9 % WEIGHT BASED INFUSION: 1.0000 mL/kg/h | INTRAVENOUS | Status: DC

## 2020-05-04 MED ORDER — IOHEXOL 350 MG/ML SOLN
INTRAVENOUS | Status: DC | PRN
  Administered 2020-05-04: 50 mL

## 2020-05-04 MED ORDER — SODIUM CHLORIDE 0.9% FLUSH: 3.0000 mL | INTRAVENOUS | Status: DC | PRN

## 2020-05-04 MED ORDER — VERAPAMIL HCL 2.5 MG/ML IV SOLN
INTRAVENOUS | Status: DC | PRN
  Administered 2020-05-04: 10 mL via INTRA_ARTERIAL

## 2020-05-04 MED ORDER — LABETALOL HCL 5 MG/ML IV SOLN: 10.0000 mg | INTRAVENOUS | Status: DC | PRN

## 2020-05-04 MED ORDER — FENTANYL CITRATE (PF) 100 MCG/2ML IJ SOLN
INTRAMUSCULAR | Status: AC
  Filled 2020-05-04: qty 2

## 2020-05-04 MED ORDER — VERAPAMIL HCL 2.5 MG/ML IV SOLN
INTRAVENOUS | Status: AC
  Filled 2020-05-04: qty 2

## 2020-05-04 MED ORDER — HEPARIN (PORCINE) IN NACL 1000-0.9 UT/500ML-% IV SOLN
INTRAVENOUS | Status: DC | PRN
  Administered 2020-05-04 (×2): 500 mL

## 2020-05-04 MED ORDER — ASPIRIN 81 MG PO CHEW
81.0000 mg | CHEWABLE_TABLET | ORAL | Status: AC
  Administered 2020-05-04: 81 mg via ORAL
  Filled 2020-05-04: qty 1

## 2020-05-04 MED ORDER — HEPARIN SODIUM (PORCINE) 1000 UNIT/ML IJ SOLN
INTRAMUSCULAR | Status: DC | PRN
  Administered 2020-05-04: 6000 [IU] via INTRAVENOUS

## 2020-05-04 MED ORDER — FENTANYL CITRATE (PF) 100 MCG/2ML IJ SOLN
INTRAMUSCULAR | Status: DC | PRN
  Administered 2020-05-04: 50 ug via INTRAVENOUS

## 2020-05-04 MED ORDER — LIDOCAINE HCL (PF) 1 % IJ SOLN
INTRAMUSCULAR | Status: AC
  Filled 2020-05-04: qty 30

## 2020-05-04 MED ORDER — ACETAMINOPHEN 325 MG PO TABS: 650.0000 mg | ORAL_TABLET | ORAL | Status: DC | PRN

## 2020-05-04 MED ORDER — MIDAZOLAM HCL 2 MG/2ML IJ SOLN
INTRAMUSCULAR | Status: DC | PRN
  Administered 2020-05-04: 1 mg via INTRAVENOUS

## 2020-05-04 MED ORDER — SODIUM CHLORIDE 0.9% FLUSH: 3.0000 mL | Freq: Two times a day (BID) | INTRAVENOUS | Status: DC

## 2020-05-04 MED ORDER — ONDANSETRON HCL 4 MG/2ML IJ SOLN: 4.0000 mg | Freq: Four times a day (QID) | INTRAMUSCULAR | Status: DC | PRN

## 2020-05-04 MED ORDER — HEPARIN (PORCINE) IN NACL 1000-0.9 UT/500ML-% IV SOLN
INTRAVENOUS | Status: AC
  Filled 2020-05-04: qty 1000

## 2020-05-04 MED ORDER — SODIUM CHLORIDE 0.9 % WEIGHT BASED INFUSION
3.0000 mL/kg/h | INTRAVENOUS | Status: AC
  Administered 2020-05-04: 3 mL/kg/h via INTRAVENOUS

## 2020-05-04 MED ORDER — HEPARIN SODIUM (PORCINE) 1000 UNIT/ML IJ SOLN
INTRAMUSCULAR | Status: AC
  Filled 2020-05-04: qty 1

## 2020-05-04 MED ORDER — LIDOCAINE HCL (PF) 1 % IJ SOLN
INTRAMUSCULAR | Status: DC | PRN
  Administered 2020-05-04: 5 mL

## 2020-05-04 MED ORDER — SODIUM CHLORIDE 0.9 % IV SOLN: 250.0000 mL | INTRAVENOUS | Status: DC | PRN

## 2020-05-04 MED ORDER — SODIUM CHLORIDE 0.9 % WEIGHT BASED INFUSION
1.0000 mL/kg/h | INTRAVENOUS | Status: DC
  Administered 2020-05-04: 1 mL/kg/h via INTRAVENOUS

## 2020-05-04 SURGICAL SUPPLY — 12 items
CATH OPTITORQUE TIG 4.0 5F (CATHETERS) ×2 IMPLANT
CATH SWAN DBL LUMAN 5F 110 (CATHETERS) ×2 IMPLANT
DEVICE RAD COMP TR BAND LRG (VASCULAR PRODUCTS) ×2 IMPLANT
GLIDESHEATH SLEND SS 6F .021 (SHEATH) ×2 IMPLANT
GUIDEWIRE INQWIRE 1.5J.035X260 (WIRE) ×1 IMPLANT
INQWIRE 1.5J .035X260CM (WIRE) ×2
KIT HEART LEFT (KITS) ×2 IMPLANT
PACK CARDIAC CATHETERIZATION (CUSTOM PROCEDURE TRAY) ×2 IMPLANT
SHEATH GLIDE SLENDER 4/5FR (SHEATH) ×2 IMPLANT
SHEATH PROBE COVER 6X72 (BAG) ×2 IMPLANT
TRANSDUCER W/STOPCOCK (MISCELLANEOUS) ×2 IMPLANT
TUBING CIL FLEX 10 FLL-RA (TUBING) ×2 IMPLANT

## 2020-05-04 NOTE — Interval H&P Note (Signed)
History and Physical Interval Note:  05/04/2020 7:31 AM  Gary Berry  has presented today for surgery, with the diagnosis of heart failure.  The various methods of treatment have been discussed with the patient and family. After consideration of risks, benefits and other options for treatment, the patient has consented to  Procedure(s): RIGHT/LEFT HEART CATH AND CORONARY ANGIOGRAPHY (N/A) as a surgical intervention.  The patient's history has been reviewed, patient examined, no change in status, stable for surgery.  I have reviewed the patient's chart and labs.  Questions were answered to the patient's satisfaction.     Plan initially is for diagnostic catheterization.  Properties use is difficult to determine until initial diagnostic data is completed. Patient has had a significant drop in his ejection fraction with frequent PVCs felt to have concern for ischemia high likelihood of having an accurate noninvasive evaluation.  Glenetta Hew

## 2020-05-04 NOTE — Discharge Instructions (Signed)
Radial Site Care  This sheet gives you information about how to care for yourself after your procedure. Your health care provider may also give you more specific instructions. If you have problems or questions, contact your health care provider. What can I expect after the procedure? After the procedure, it is common to have:  Bruising and tenderness at the catheter insertion area. Follow these instructions at home: Medicines  Take over-the-counter and prescription medicines only as told by your health care provider. Insertion site care  Follow instructions from your health care provider about how to take care of your insertion site. Make sure you: ? Wash your hands with soap and water before you change your bandage (dressing). If soap and water are not available, use hand sanitizer. ? Change your dressing as told by your health care provider. ? Leave stitches (sutures), skin glue, or adhesive strips in place. These skin closures may need to stay in place for 2 weeks or longer. If adhesive strip edges start to loosen and curl up, you may trim the loose edges. Do not remove adhesive strips completely unless your health care provider tells you to do that.  Check your insertion site every day for signs of infection. Check for: ? Redness, swelling, or pain. ? Fluid or blood. ? Pus or a bad smell. ? Warmth.  Do not take baths, swim, or use a hot tub until your health care provider approves.  You may shower 24-48 hours after the procedure, or as directed by your health care provider. ? Remove the dressing and gently wash the site with plain soap and water. ? Pat the area dry with a clean towel. ? Do not rub the site. That could cause bleeding.  Do not apply powder or lotion to the site. Activity   For 24 hours after the procedure, or as directed by your health care provider: ? Do not flex or bend the affected arm. ? Do not push or pull heavy objects with the affected arm. ? Do not  drive yourself home from the hospital or clinic. You may drive 24 hours after the procedure unless your health care provider tells you not to. ? Do not operate machinery or power tools.  Do not lift anything that is heavier than 10 lb (4.5 kg), or the limit that you are told, until your health care provider says that it is safe.  Ask your health care provider when it is okay to: ? Return to work or school. ? Resume usual physical activities or sports. ? Resume sexual activity. General instructions  If the catheter site starts to bleed, raise your arm and put firm pressure on the site. If the bleeding does not stop, get help right away. This is a medical emergency.  If you went home on the same day as your procedure, a responsible adult should be with you for the first 24 hours after you arrive home.  Keep all follow-up visits as told by your health care provider. This is important. Contact a health care provider if:  You have a fever.  You have redness, swelling, or yellow drainage around your insertion site. Get help right away if:  You have unusual pain at the radial site.  The catheter insertion area swells very fast.  The insertion area is bleeding, and the bleeding does not stop when you hold steady pressure on the area.  Your arm or hand becomes pale, cool, tingly, or numb. These symptoms may represent a serious problem   that is an emergency. Do not wait to see if the symptoms will go away. Get medical help right away. Call your local emergency services (911 in the [place] Do not drive yourself to the hospital. Summary  After the procedure, it is common to have bruising and tenderness at the site.  Follow instructions from your health care provider about how to take care of your radial site wound. Check the wound every day for signs of infection.  Do not lift anything that is heavier than 10 lb (4.5 kg), or the limit that you are told, until your health care provider says  that it is safe. This information is not intended to replace advice given to you by your health care provider. Make sure you discuss any questions you have with your health care provider. Document Revised: 11/11/2017 Document Reviewed: 11/11/2017 Elsevier Patient Education  2020 Elsevier Inc.  

## 2020-05-04 NOTE — Research (Signed)
PHDEV Informed Consent   Subject Name: Gary Berry  Subject met inclusion and exclusion criteria.  The informed consent form, study requirements and expectations were reviewed with the subject and questions and concerns were addressed prior to the signing of the consent form.  The subject verbalized understanding of the trial requirements.  The subject agreed to participate in the [facility] trial and signed the informed consent at 0655 on 05/04/2020.  The informed consent was obtained prior to performance of any protocol-specific procedures for the subject.  A copy of the signed informed consent was given to the subject and a copy was placed in the subject's medical record.   Gary Berry

## 2020-05-04 NOTE — Progress Notes (Signed)
Discharge instructions reviewed with pt and his wife (via telephone) both voice understanding.  

## 2020-05-07 ENCOUNTER — Telehealth: Payer: Self-pay

## 2020-05-07 NOTE — Telephone Encounter (Signed)
LVM for pt to call me back to schedule sleep study  

## 2020-05-10 ENCOUNTER — Telehealth: Payer: Self-pay | Admitting: Internal Medicine

## 2020-05-10 DIAGNOSIS — Z79899 Other long term (current) drug therapy: Secondary | ICD-10-CM

## 2020-05-10 MED ORDER — ENTRESTO 24-26 MG PO TABS: 1.0000 | ORAL_TABLET | Freq: Two times a day (BID) | ORAL | 5 refills | Status: DC

## 2020-05-10 MED ORDER — FLECAINIDE ACETATE 50 MG PO TABS
50.0000 mg | ORAL_TABLET | Freq: Two times a day (BID) | ORAL | 5 refills | Status: DC
Start: 2020-05-10 — End: 2020-06-28

## 2020-05-10 NOTE — Addendum Note (Signed)
Addended by: Fidel Levy on: 05/10/2020 04:28 PM   Modules accepted: Orders

## 2020-05-10 NOTE — Telephone Encounter (Signed)
Patient confirmed med changes are OK. Rx(s) sent pharmacy and BMET ordered. Nurse visit scheduled for 8/5. Will wait on confirmation from patient

## 2020-05-10 NOTE — Telephone Encounter (Signed)
Pixie Casino, MD  Fidel Levy, RN Yes .. stop lisinopril and switch to Entresto 24/26 mg BID. Also, wanted to start flecainide 50 mg BID to suppress his PVC's. Will need an EKG and BMET about 1 week after starting these meds. Follow-up in 2 months at recall.   Dr Lemmie Evens

## 2020-05-14 NOTE — Telephone Encounter (Signed)
Patient confirmed med changes, BMET, nurse visit via MyChart message

## 2020-05-20 ENCOUNTER — Ambulatory Visit (INDEPENDENT_AMBULATORY_CARE_PROVIDER_SITE_OTHER): Payer: 59 | Admitting: Neurology

## 2020-05-20 DIAGNOSIS — I5022 Chronic systolic (congestive) heart failure: Secondary | ICD-10-CM

## 2020-05-20 DIAGNOSIS — R351 Nocturia: Secondary | ICD-10-CM

## 2020-05-20 DIAGNOSIS — G4733 Obstructive sleep apnea (adult) (pediatric): Secondary | ICD-10-CM

## 2020-05-20 DIAGNOSIS — I493 Ventricular premature depolarization: Secondary | ICD-10-CM

## 2020-05-20 DIAGNOSIS — E669 Obesity, unspecified: Secondary | ICD-10-CM

## 2020-05-20 DIAGNOSIS — R9431 Abnormal electrocardiogram [ECG] [EKG]: Secondary | ICD-10-CM

## 2020-05-20 DIAGNOSIS — G4720 Circadian rhythm sleep disorder, unspecified type: Secondary | ICD-10-CM

## 2020-05-20 DIAGNOSIS — R0683 Snoring: Secondary | ICD-10-CM

## 2020-05-24 ENCOUNTER — Ambulatory Visit (INDEPENDENT_AMBULATORY_CARE_PROVIDER_SITE_OTHER): Payer: 59

## 2020-05-24 ENCOUNTER — Other Ambulatory Visit: Payer: Self-pay

## 2020-05-24 DIAGNOSIS — Z79899 Other long term (current) drug therapy: Secondary | ICD-10-CM | POA: Diagnosis not present

## 2020-05-25 LAB — BASIC METABOLIC PANEL
BUN/Creatinine Ratio: 14 (ref 9–20)
BUN: 13 mg/dL (ref 6–24)
CO2: 26 mmol/L (ref 20–29)
Calcium: 10 mg/dL (ref 8.7–10.2)
Chloride: 104 mmol/L (ref 96–106)
Creatinine, Ser: 0.95 mg/dL (ref 0.76–1.27)
GFR calc Af Amer: 107 mL/min/{1.73_m2} (ref >59)
GFR calc non Af Amer: 92 mL/min/{1.73_m2} (ref >59)
Glucose: 85 mg/dL (ref 65–99)
Potassium: 4.9 mmol/L (ref 3.5–5.2)
Sodium: 143 mmol/L (ref 134–144)

## 2020-06-04 NOTE — Addendum Note (Signed)
Addended by: Star Age on: 06/04/2020 02:14 PM   Modules accepted: Orders

## 2020-06-04 NOTE — Procedures (Signed)
PATIENT'S NAME:  Gary Berry, Gary Berry DOB:      1971-09-12      MR#:    [id number]     DATE OF RECORDING: 05/20/2020 REFERRING M.D.:  Billey Chang MD Study Performed:   Baseline Polysomnogram HISTORY: 49 year old man with a history of hypertension, diabetes, hyperlipidemia, chronic shoulder pain, chronic systolic congestive heart failure and obesity, who reports snoring and some sleep disruption. The patient endorsed the Epworth Sleepiness Scale at 4 points. The patient's weight 238 pounds with a height of 69 (inches), resulting in a BMI of 35.3 kg/m2. The patient's neck circumference measured 17.8 inches.  CURRENT MEDICATIONS: Aspirin, Victoza, Zestril, Metformin, Zocor, Cialis   PROCEDURE:  This is a multichannel digital polysomnogram utilizing the Somnostar 11.2 system.  Electrodes and sensors were applied and monitored per AASM Specifications.   EEG, EOG, Chin and Limb EMG, were sampled at 200 Hz.  ECG, Snore and Nasal Pressure, Thermal Airflow, Respiratory Effort, CPAP Flow and Pressure, Oximetry was sampled at 50 Hz. Digital video and audio were recorded.      BASELINE STUDY  Lights Out was at 22:25 and Lights On at 05:01.  Total recording time (TRT) was 396.5 minutes, with a total sleep time (TST) of 354 minutes. The patient's sleep latency was 35.5 minutes.  REM latency was 82 minutes, which is normal. The sleep efficiency was 89.3%.     SLEEP ARCHITECTURE: WASO (Wake after sleep onset) was 35.5 minutes with moderate sleep fragmentation noted. There were 48.5 minutes in Stage N1, 147 minutes Stage N2, 87.5 minutes Stage N3 and 71 minutes in Stage REM.  The percentage of Stage N1 was 13.7%, which is increased, Stage N2 was 41.5%, Stage N3 was 24.7% and Stage R (REM sleep) was 20.1%, which is normal. The arousals were noted as: 89 were spontaneous, 0 were associated with PLMs, 11 were associated with respiratory events.  RESPIRATORY ANALYSIS:  There were a total of 30 respiratory events:  3 obstructive  apneas, 9 central apneas and 11 mixed apneas with a total of 23 apneas and an apnea index (AI) of 3.9 /hour. There were 7 hypopneas with a hypopnea index of 1.2 /hour. The patient also had 0 respiratory event related arousals (RERAs).      The total APNEA/HYPOPNEA INDEX (AHI) was 5.1/hour and the total RESPIRATORY DISTURBANCE INDEX was  5.1 /hour.  15 events occurred in REM sleep and 15 events in NREM. The REM AHI was  12.7 /hour, versus a non-REM AHI of 3.2. The patient spent 83.5 minutes of total sleep time in the supine position and 271 minutes in non-supine. The supine AHI was 13.0 versus a non-supine AHI of 2.7.  OXYGEN SATURATION & C02:  The Wake baseline 02 saturation was 98%, with the lowest being 90%. Time spent below 89% saturation equaled 0 minutes.  PERIODIC LIMB MOVEMENTS: The patient had a total of 0 Periodic Limb Movements.  The Periodic Limb Movement (PLM) index was 0 and the PLM Arousal index was 0/hour.  Audio and video analysis did not show any abnormal or unusual movements, behaviors, phonations or vocalizations. The patient took no bathroom breaks. Mild snoring was noted. The EKG showed frequent PVCs including bigeminy and trigeminy.   Post-study, the patient indicated that sleep was worse than usual.   IMPRESSION:  1. Obstructive Sleep Apnea (OSA) 2. Dysfunctions associated with sleep stages or arousal from sleep 3. Non-specific abnormal EKG  RECOMMENDATIONS:  1. This study demonstrates overall borderline or very mild obstructive sleep apnea, more  pronounced during supine and REM sleep, with a total AHI of 5.1/hour, REM AHI of 12.7/hour, supine AHI of 13/hour and O2 nadir of 90%. Given the patient's medical history and sleep related complaints, treatment with positive airway pressure is recommended; this can be achieved in the form of autoPAP. Alternatively, a full-night CPAP titration study would allow optimization of therapy if needed. Other treatment options may include  avoidance of supine sleep position along with weight loss, upper airway or jaw surgery in selected patients or the use of an oral appliance in certain patients. ENT evaluation and/or consultation with a maxillofacial surgeon or dentist may be feasible in some instances.    2. Please note that untreated obstructive sleep apnea may carry additional perioperative morbidity. Patients with significant obstructive sleep apnea should receive perioperative PAP therapy and the surgeons and particularly the anesthesiologist should be informed of the diagnosis and the severity of the sleep disordered breathing. 3. The study showed frequent PVCs on single lead EKG; clinical correlation is recommended. The patient had a recent consultation with cardiology and workup and management is underway.  4. The patient should be cautioned not to drive, work at heights, or operate dangerous or heavy equipment when tired or sleepy. Review and reiteration of good sleep hygiene measures should be pursued with any patient. 5. The patient will be seen in follow-up by Dr. Rexene Alberts at [facility] for discussion of the test results and further management strategies. The referring provider will be notified of the test results.  I certify that I have reviewed the entire raw data recording prior to the issuance of this report in accordance with the Standards of Accreditation of the American Academy of Sleep Medicine (AASM)  Star Age, MD, PhD Diplomat, American Board of Neurology and Sleep Medicine (Neurology and Sleep Medicine)

## 2020-06-04 NOTE — Progress Notes (Signed)
Patient referred by Dr. Raliegh Scarlet, seen by me on 04/30/20, diagnostic PSG on 05/20/20.    Please call and notify the patient that the recent sleep study did confirm the diagnosis of obstructive sleep apnea. OSA is overall mild, but worth treating to see if he feels better after treatment and in particular, if his PVCs and cardiac function improves over time. To that end, I recommend treatment in the form of autoPAP, which means, that we don't have to bring him back for a second sleep study with CPAP, but will let him try an autoPAP machine at home, through a DME company (of his choice, or as per insurance requirement). The DME representative will educate him on how to use the machine, how to put the mask on, etc. I have placed an order in the chart. Please send referral, talk to patient, send report to referring MD. We will need a FU in sleep clinic for 10 weeks post-PAP set up, please arrange that with me or one of our NPs. Thanks,   Star Age, MD, PhD [facility])

## 2020-06-05 ENCOUNTER — Telehealth: Payer: Self-pay

## 2020-06-05 NOTE — Telephone Encounter (Signed)
I called pt. I advised pt that Dr. At reviewed their sleep study results and found that pt did have mild osa. Dr. Rexene Alberts recommends that pt start on auto pap at home for treatment. I reviewed PAP compliance expectations with the pt. Pt is agreeable to starting an auto-PAP. I advised pt that an order will be sent to a DME, [facility], and [facility] will call the pt within about one week after they file with the pt's insurance. [facility] will show the pt how to use the machine, fit for masks, and troubleshoot the auto-PAP if needed. A follow up appt was made for insurance purposes with Dr. Rexene Alberts on 10/21/2021verbalized understanding to arrive 15 minutes early and bring their auto-PAP. A letter with all of this information in it will be mailed to the pt as a reminder. I verified with the pt that the address we have on file is correct. Pt verbalized understanding of results. Pt had no questions at this time but was encouraged to call back if questions arise. I have sent the order to [facility] and have received confirmation that they have received the order.

## 2020-06-05 NOTE — Telephone Encounter (Signed)
-----   Message from Star Age, MD sent at 06/04/2020  2:14 PM EDT ----- Patient referred by Dr. Raliegh Scarlet, seen by me on 04/30/20, diagnostic PSG on 05/20/20.    Please call and notify the patient that the recent sleep study did confirm the diagnosis of obstructive sleep apnea. OSA is overall mild, but worth treating to see if he feels better after treatment and in particular, if his PVCs and cardiac function improves over time. To that end, I recommend treatment in the form of autoPAP, which means, that we don't have to bring him back for a second sleep study with CPAP, but will let him try an autoPAP machine at home, through a DME company (of his choice, or as per insurance requirement). The DME representative will educate him on how to use the machine, how to put the mask on, etc. I have placed an order in the chart. Please send referral, talk to patient, send report to referring MD. We will need a FU in sleep clinic for 10 weeks post-PAP set up, please arrange that with me or one of our NPs. Thanks,   Star Age, MD, PhD [facility])

## 2020-06-15 ENCOUNTER — Ambulatory Visit: Payer: 59 | Admitting: Internal Medicine

## 2020-06-28 ENCOUNTER — Telehealth: Payer: Self-pay | Admitting: Radiology

## 2020-06-28 ENCOUNTER — Ambulatory Visit (INDEPENDENT_AMBULATORY_CARE_PROVIDER_SITE_OTHER): Payer: 59 | Admitting: Internal Medicine

## 2020-06-28 ENCOUNTER — Encounter: Payer: Self-pay | Admitting: Internal Medicine

## 2020-06-28 ENCOUNTER — Other Ambulatory Visit: Payer: Self-pay

## 2020-06-28 VITALS — BP 117/78 | HR 67 | Temp 97.0°F | Ht 69.0 in | Wt 242.2 lb

## 2020-06-28 DIAGNOSIS — I5020 Unspecified systolic (congestive) heart failure: Secondary | ICD-10-CM | POA: Diagnosis not present

## 2020-06-28 DIAGNOSIS — I428 Other cardiomyopathies: Secondary | ICD-10-CM

## 2020-06-28 DIAGNOSIS — I493 Ventricular premature depolarization: Secondary | ICD-10-CM | POA: Diagnosis not present

## 2020-06-28 MED ORDER — FLECAINIDE ACETATE 100 MG PO TABS
100.0000 mg | ORAL_TABLET | Freq: Two times a day (BID) | ORAL | 3 refills | Status: DC
Start: 2020-06-28 — End: 2020-08-06

## 2020-06-28 NOTE — Telephone Encounter (Signed)
Enrolled patient for a 14 day Zio XT  monitor to be mailed to patients home  °

## 2020-06-28 NOTE — Patient Instructions (Signed)
Medication Instructions:    increase flecainide 100 mg  One tablet twice a day   *If you need a refill on your cardiac medications before your next appointment, please call your pharmacy*   Lab Work: Not needed.   Testing/Procedures:  will be schedule at [address]- Dec 2021 Your physician has requested that you have an echocardiogram. Echocardiography is a painless test that uses sound waves to create images of your heart. It provides your doctor with information about the size and shape of your heart and how well your heart's chambers and valves are working. This procedure takes approximately one hour. There are no restrictions for this procedure.  And Your physician has recommended that you wear a  14 DAY ZIO-PATCH monitor. The Zio patch cardiac monitor continuously records heart rhythm data for up to 14 days, this is for patients being evaluated for multiple types heart rhythms. For the first 24 hours post application, please avoid getting the Zio monitor wet in the shower or by excessive sweating during exercise. After that, feel free to carry on with regular activities. Keep soaps and lotions away from the ZIO XT Patch.  This will be mailed to you, please expect 7-10 days to receive.           Follow-Up: At [facility], you and your health needs are our priority.  As part of our continuing mission to provide you with exceptional heart care, we have created designated Provider Care Teams.  These Care Teams include your primary Cardiologist (physician) and Advanced Practice Providers (APPs -  Physician Assistants and Nurse Practitioners) who all work together to provide you with the care you need, when you need it.   Your next appointment:   3 month(s)  The format for your next appointment:   In Person  Provider:   Raliegh Ip Mali Hilty, MD   Other Instructions  [facility]- Long Term Monitor Instructions   Your physician has requested you wear your ZIO  patch monitor___14____days.   This is a single patch monitor.  [employer] supplies one patch monitor per enrollment.  Additional stickers are not available.   Please do not apply patch if you will be having a Nuclear Stress Test, Echocardiogram, Cardiac CT, MRI, or Chest Xray during the time frame you would be wearing the monitor. The patch cannot be worn during these tests.  You cannot remove and re-apply the ZIO XT patch monitor.   Your ZIO patch monitor will be sent USPS Priority mail from [facility] directly to your home address. The monitor may also be mailed to a PO BOX if home delivery is not available.   It may take 3-5 days to receive your monitor after you have been enrolled.   Once you have received you monitor, please review enclosed instructions.  Your monitor has already been registered assigning a specific monitor serial # to you.   Applying the monitor   Shave hair from upper left chest.   Hold abrader disc by orange tab.  Rub abrader in 40 strokes over left upper chest as indicated in your monitor instructions.   Clean area with 4 enclosed alcohol pads .  Use all pads to assure are is cleaned thoroughly.  Let dry.   Apply patch as indicated in monitor instructions.  Patch will be place under collarbone on left side of chest with arrow pointing upward.   Rub patch adhesive wings for 2 minutes.Remove white label marked "1".  Remove white  label marked "2".  Rub patch adhesive wings for 2 additional minutes.   While looking in a mirror, press and release button in center of patch.  A small green light will flash 3-4 times .  This will be your only indicator the monitor has been turned on.     Do not shower for the first 24 hours.  You may shower after the first 24 hours.   Press button if you feel a symptom. You will hear a small click.  Record Date, Time and Symptom in the Patient Log Book.   When you are ready to remove patch, follow instructions on last 2 pages of  Patient Log Book.  Stick patch monitor onto last page of Patient Log Book.   Place Patient Log Book in [place] box.  Use locking tab on box and tape box closed securely.  The [employer] has [employer] on it.  Please place in mailbox as soon as possible.  Your physician should have your test results approximately 7 days after the monitor has been mailed back to [facility].   Call [place] at [phone] if you have questions regarding your ZIO XT patch monitor.  Call them immediately if you see an orange light blinking on your monitor.   If your monitor falls off in less than 4 days contact our Monitor department at [phone].  If your monitor becomes loose or falls off after 4 days call [employer] at [phone] for suggestions on securing your monitor.

## 2020-06-28 NOTE — Progress Notes (Signed)
OFFICE CONSULT NOTE  Chief Complaint:  New onset heart failure  Primary Care Physician: Leamon Arnt, MD  HPI:  Gary Berry is a 49 y.o. male who is being seen today for the evaluation of heart failure at the request of Leamon Arnt, MD.  This is a pleasant 49 year old male whose wife is at [place].  He was referred for evaluation of newly recognized systolic congestive heart failure.  Apparently he had been having some palpitations which were evaluated by his PCP.  An echocardiogram was ordered as well as a Holter monitor.  He was found to have some PVCs on and off his EKG.  The echocardiogram was performed on 04/19/2020 and indicated an LVEF of 35 to 40% with global hypokinesis and moderate LVH, grade 1 diastolic dysfunction and moderate RV systolic dysfunction.  Pressure and IVC were normal with moderate left atrial enlargement.  Despite these findings, Gary Berry has no heart failure symptoms.  He denies worsening shortness of breath, orthopnea, lower extremity edema, chest pain, presyncope, syncopal episodes or any other associated symptoms.  He has had palpitations as mentioned and was noted to have PVCs.  The Holter monitor result is not yet available to determine his burden of PVCs.  According to his wife he also has a history of some alcohol use and was drinking about 3-4 drinks a night which had picked up since the beginning of the Covid pandemic.  Apparently before that it was much less.  He has recently decreased this.  In addition he was described as a very heavy caffeine user and recently has been decreasing that as well.  There is heart disease in his family including his father who had heart disease in his 27s.  He also has type 2 diabetes without insulin use, hypertension and mild obesity.  Recent hemoglobin A1c was 5.3.  Direct LDL in October was 69, however triglycerides were elevated at 273.  06/28/2020  Gary Berry returns today for follow-up.  Overall he  continues to feel well.  He is generally unaware of his heart failure or PVCs.  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  There is also a right bundle branch block which has been stable even prior to starting flecainide.  QTc is 431 ms.  Repeat lab work on [place] showed normal creatinine and blood pressure is excellent at 118/78.  PMHx:  Past Medical History:  Diagnosis Date  . Chronic right shoulder pain, requiring injection prn 03/20/2017  . 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 Hyperlipidemia   . Hyperlipidemia associated with type 2 diabetes mellitus ([place]) 11/21/2016  . Hypertension associated with diabetes ([place]) 11/21/2016  . Obesity, Class II, BMI 35-39.9 11/21/2016  . Systolic congestive heart failure with reduced left ventricular function, NYHA class 2 ([place]) 04/24/2020   Echocardiogram 04/2020:  Ef 35-40%, global hypokinesis  . Type 2 diabetes mellitus without complication, without long-term current use of insulin ([place]) 11/21/2016    Past Surgical History:  Procedure Laterality Date  . HERNIA REPAIR  2016   hard to wake up after sx  . RIGHT/LEFT HEART CATH AND CORONARY ANGIOGRAPHY N/A 05/04/2020   Procedure: RIGHT/LEFT HEART CATH AND CORONARY ANGIOGRAPHY;  Surgeon: Leonie Man, MD;  Location: [facility];  Service: Cardiovascular;  Laterality: N/A;  . TONSILLECTOMY  1975  .  VASECTOMY      FAMHx:  Family History  Problem Relation Age of Onset  . Diabetes Mother   . Hypertension Mother   . Atrial fibrillation Father   . Colon polyps Father   . Colon cancer Neg Hx   . Esophageal cancer Neg Hx   . Rectal cancer Neg Hx   . Stomach cancer Neg Hx     SOCHx:   reports that he has never smoked. He quit smokeless tobacco use about 22 years ago.  His smokeless tobacco use included chew. He reports current  alcohol use of about 3.0 standard drinks of alcohol per week. He reports that he does not use drugs.  ALLERGIES:  No Known Allergies  ROS: Pertinent items noted in HPI and remainder of comprehensive ROS otherwise negative.  HOME MEDS: Current Outpatient Medications on File Prior to Visit  Medication Sig Dispense Refill  . aspirin EC 81 MG tablet Take 81 mg by mouth daily. Swallow whole.    . B-D UF III MINI PEN NEEDLES 31G X 5 MM MISC See admin instructions.    . calcium carbonate (TUMS - DOSED IN MG ELEMENTAL CALCIUM) 500 MG chewable tablet Chew 1,000 mg by mouth daily as needed for indigestion or heartburn.    . cetirizine (ZYRTEC) 10 MG tablet Take 10 mg by mouth daily as needed for allergies.    . Cholecalciferol (VITAMIN D) 125 MCG (5000 UT) CAPS Take 5,000 Units by mouth daily.     . Cyanocobalamin (VITAMIN B12) 1000 MCG TBCR Take 1,000 mcg by mouth daily.     . flecainide (TAMBOCOR) 50 MG tablet Take 1 tablet (50 mg total) by mouth 2 (two) times daily. 60 tablet 5  . ibuprofen (ADVIL) 200 MG tablet Take 400 mg by mouth every 6 (six) hours as needed for headache or moderate pain.    Marland Kitchen liraglutide (VICTOZA) 18 MG/3ML SOPN INJECT 1.8 MG UNDER THE SKIN ONCE DAILY (Patient taking differently: Inject 1.8 mg into the skin daily. ) 9 pen 11  . metFORMIN (GLUCOPHAGE) 500 MG tablet Take 1 tablet (500 mg total) by mouth 2 (two) times daily at 8 am and 10 pm. 180 tablet 4  . metoprolol succinate (TOPROL-XL) 25 MG 24 hr tablet Take 1 tablet (25 mg total) by mouth daily. 90 tablet 3  . naproxen sodium (ALEVE) 220 MG tablet Take 220 mg by mouth daily as needed (pain).    . Omega-3 Fatty Acids (FISH OIL) 1000 MG CAPS Take 2,000 mg by mouth daily.    . sacubitril-valsartan (ENTRESTO) 24-26 MG Take 1 tablet by mouth 2 (two) times daily. 60 tablet 5  . simvastatin (ZOCOR) 20 MG tablet Take 1 tablet (20 mg total) by mouth daily at 6 PM. 90 tablet 3  . tadalafil (CIALIS) 5 MG tablet TAKE 1 TABLET BY  MOUTH DAILY AS NEEDED FOR ERECTILE DYSFUNCTION (Patient taking differently: Take 5 mg by mouth daily as needed for erectile dysfunction. ) 90 tablet 1   No current facility-administered medications on file prior to visit.    LABS/IMAGING: No results found for this or any previous visit (from the past 48 hour(s)). No results found.  LIPID PANEL:    Component Value Date/Time   CHOL 147 08/02/2019 0919   TRIG 273.0 (H) 08/02/2019 0919   HDL 46.10 08/02/2019 0919   CHOLHDL 3 08/02/2019 0919   VLDL 54.6 (H) 08/02/2019 0919   LDLCALC 68 02/21/2019 0904   LDLDIRECT 69.0 08/02/2019 0919  WEIGHTS: Wt Readings from Last 3 Encounters:  06/28/20 242 lb 3.2 oz (109.9 kg)  05/04/20 238 lb (108 kg)  05/01/20 230 lb 3.2 oz (104.4 kg)    VITALS: BP 117/78   Pulse 67   Temp (!) 97 F (36.1 C)   Ht 5\' 9"  (1.753 m)   Wt 242 lb 3.2 oz (109.9 kg)   SpO2 97%   BMI 35.77 kg/m   EXAM: General appearance: alert, no distress and mildly obese Neck: no carotid bruit, no JVD and thyroid not enlarged, symmetric, no tenderness/mass/nodules Lungs: clear to auscultation bilaterally Heart: regular rate and rhythm, S1, S2 normal, no murmur, click, rub or gallop Abdomen: soft, non-tender; bowel sounds normal; no masses,  no organomegaly Extremities: extremities normal, atraumatic, no cyanosis or edema Pulses: 2+ and symmetric Skin: Skin color, texture, turgor normal. 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 Recent reduction in caffeine and alcohol use 4. Family history of coronary disease 5. Type II 2 diabetes -A1c 5.3 6. Hypertension 7. Dyslipidemia  PLAN: 1.   Gary Berry seems to have had better control  of his PVCs on flecainide however today is noted to be in a ventricular trigeminy.  I am concerned that this is still an issue or may not be well controlled on his current dose of flecainide.  He seems to be tolerating the Entresto.  He notes no significant change in his symptoms.  He is also made significant reductions in his caffeine and alcohol intake.  I advised we increase his flecainide further up to 100 mg twice a day.  In addition we will place a 2-week monitor to see if we are actually suppressing them.  If this is not more effective, then I will refer him to EP for evaluation of alternative antiarrhythmic therapy or possibly a PVC ablation.  We will plan a follow-up ultimately in 3 more months with a repeat echo which should be about 6 months since his original diagnosis.  Pixie Casino, MD, [facility], [place] Director of the Advanced Lipid Disorders &  Cardiovascular Risk Reduction Clinic Diplomate of the American Board of Clinical Lipidology Attending Cardiologist  Direct Dial: [phone]  Fax: [phone]  Website:  [email or web address] Candon Caras 06/28/2020, 9:09 AM

## 2020-07-03 ENCOUNTER — Other Ambulatory Visit (INDEPENDENT_AMBULATORY_CARE_PROVIDER_SITE_OTHER): Payer: 59

## 2020-07-03 DIAGNOSIS — I428 Other cardiomyopathies: Secondary | ICD-10-CM

## 2020-07-03 DIAGNOSIS — I5020 Unspecified systolic (congestive) heart failure: Secondary | ICD-10-CM | POA: Diagnosis not present

## 2020-07-03 DIAGNOSIS — I493 Ventricular premature depolarization: Secondary | ICD-10-CM

## 2020-07-18 ENCOUNTER — Other Ambulatory Visit: Payer: Self-pay | Admitting: Family Medicine

## 2020-07-18 DIAGNOSIS — E782 Mixed hyperlipidemia: Secondary | ICD-10-CM

## 2020-07-19 ENCOUNTER — Other Ambulatory Visit: Payer: Self-pay | Admitting: *Deleted

## 2020-07-19 ENCOUNTER — Ambulatory Visit (INDEPENDENT_AMBULATORY_CARE_PROVIDER_SITE_OTHER): Payer: 59 | Admitting: Family Medicine

## 2020-07-19 ENCOUNTER — Encounter: Payer: Self-pay | Admitting: Family Medicine

## 2020-07-19 ENCOUNTER — Other Ambulatory Visit: Payer: Self-pay

## 2020-07-19 VITALS — BP 124/78 | HR 74 | Temp 96.3°F | Resp 16 | Ht 69.0 in | Wt 236.6 lb

## 2020-07-19 DIAGNOSIS — E1159 Type 2 diabetes mellitus with other circulatory complications: Secondary | ICD-10-CM

## 2020-07-19 DIAGNOSIS — I152 Hypertension secondary to endocrine disorders: Secondary | ICD-10-CM

## 2020-07-19 DIAGNOSIS — I428 Other cardiomyopathies: Secondary | ICD-10-CM

## 2020-07-19 DIAGNOSIS — Z1159 Encounter for screening for other viral diseases: Secondary | ICD-10-CM | POA: Diagnosis not present

## 2020-07-19 DIAGNOSIS — Z0000 Encounter for general adult medical examination without abnormal findings: Secondary | ICD-10-CM | POA: Diagnosis not present

## 2020-07-19 DIAGNOSIS — E785 Hyperlipidemia, unspecified: Secondary | ICD-10-CM

## 2020-07-19 DIAGNOSIS — E1169 Type 2 diabetes mellitus with other specified complication: Secondary | ICD-10-CM

## 2020-07-19 DIAGNOSIS — E119 Type 2 diabetes mellitus without complications: Secondary | ICD-10-CM

## 2020-07-19 DIAGNOSIS — I5020 Unspecified systolic (congestive) heart failure: Secondary | ICD-10-CM | POA: Diagnosis not present

## 2020-07-19 DIAGNOSIS — E782 Mixed hyperlipidemia: Secondary | ICD-10-CM

## 2020-07-19 DIAGNOSIS — I10 Essential (primary) hypertension: Secondary | ICD-10-CM

## 2020-07-19 DIAGNOSIS — I493 Ventricular premature depolarization: Secondary | ICD-10-CM

## 2020-07-19 DIAGNOSIS — Z23 Encounter for immunization: Secondary | ICD-10-CM

## 2020-07-19 LAB — POCT GLYCOSYLATED HEMOGLOBIN (HGB A1C): Hemoglobin A1C: 5.2 % (ref 4.0–5.6)

## 2020-07-19 MED ORDER — VICTOZA 18 MG/3ML ~~LOC~~ SOPN: 1.8000 mg | PEN_INJECTOR | Freq: Every day | SUBCUTANEOUS | 3 refills | Status: DC

## 2020-07-19 MED ORDER — SIMVASTATIN 20 MG PO TABS: 20.0000 mg | ORAL_TABLET | Freq: Every day | ORAL | 3 refills | Status: DC

## 2020-07-19 NOTE — Progress Notes (Signed)
Subjective  Chief Complaint  Patient presents with  . Annual Exam    fasting   . Diabetes    HPI: Gary Berry is a 49 y.o. male who presents to [place] at [place] today for a Male Wellness Visit. He also has the concerns and/or needs as listed above in the chief complaint. These will be addressed in addition to the Health Maintenance Visit.   Wellness Visit: annual visit with health maintenance review and exam    Health maintenance: Due for flu exam today.  Reviewed recent lab work for the last 3 to 6 months.  Normal CBC, CMP, follow-up BMP, TSH.  He is asking about the Covid booster.  He is high risk.  He is eating better, has decreased caffeine and alcohol use.  Feels well.  Emotionally handling his new diagnosis well.  Lifestyle: Body mass index is 34.94 kg/m. Wt Readings from Last 3 Encounters:  07/19/20 236 lb 9.6 oz (107.3 kg)  06/28/20 242 lb 3.2 oz (109.9 kg)  05/04/20 238 lb (108 kg)    Chronic disease management visit and/or acute problem visit:  Diabetes follow up: His diabetic control is reported as Unchanged.  Again discussed his medications and control of his diabetes.  Over the last 3 to 4 years his A1c's have not ranged above 5.6.  He takes Victoza 1.8 mg subcutaneously daily along with Metformin 500 twice daily.  He was diagnosed over a decade ago and weighed 60 pounds more than he does now. He denies exertional CP or SOB or symptomatic hypoglycemia. He denies foot sores or paresthesias.  His eye exam is up-to-date.  Cardiology: I reviewed multiple records since I referred him to cardiology in March.  He has had an extensive work-up including cardiac catheterization with nonobstructive coronaries, heart failure with reduced EF by echocardiogram, and significant PVC burden now on flecainide.  Overall he is asymptomatic.  He is coping with the diagnosis well.  He is hopeful things will improve.  He will follow-up with cardiology in December.  He  denies chest pain or shortness of breath or feeling palpitations.  Hyperlipidemia on statin: He is fasting for repeat lipid panel today.  LFTs have been normal.  Tolerates it well without side effects.  Diet has improved.  Hypertension is well controlled   Immunization History  Administered Date(s) Administered  . Influenza Inj Mdck Quad Pf 08/01/2017  . Influenza, Seasonal, Injecte, Preservative Fre 08/01/2017  . Influenza,inj,Quad PF,6+ Mos 12/06/2015, 11/21/2016, 08/22/2018, 07/19/2020  . Influenza-Unspecified 08/22/2018, 08/01/2019  . PFIZER SARS-COV-2 Vaccination 12/30/2019, 01/20/2020  . Pneumococcal Polysaccharide-23 05/25/2017  . Tdap 12/18/2011    Diabetes Related Lab Review: Lab Results  Component Value Date   HGBA1C 5.3 01/12/2020   HGBA1C 5.6 08/02/2019   HGBA1C 5.6 02/21/2019    No results found for: Derl B[place] Lab Results  Component Value Date   CREATININE 0.95 05/24/2020   BUN 13 05/24/2020   NA 143 05/24/2020   K 4.9 05/24/2020   CL 104 05/24/2020   CO2 26 05/24/2020   Lab Results  Component Value Date   CHOL 147 08/02/2019   CHOL 149 02/21/2019   CHOL 135 11/27/2017   Lab Results  Component Value Date   HDL 46.10 08/02/2019   HDL 52.10 02/21/2019   HDL 39.80 11/27/2017   Lab Results  Component Value Date   LDLCALC 68 02/21/2019   [place] 68 11/27/2017   LDLCALC 57 11/26/2016   Lab Results  Component Value  Date   TRIG 273.0 (H) 08/02/2019   TRIG 145.0 02/21/2019   TRIG 137.0 11/27/2017   Lab Results  Component Value Date   CHOLHDL 3 08/02/2019   CHOLHDL 3 02/21/2019   CHOLHDL 3 11/27/2017   Lab Results  Component Value Date   LDLDIRECT 69.0 08/02/2019   The 10-year ASCVD risk score Mikey Bussing DC Jr., et al., 2013) is: 5.8%   Values used to calculate the score:     Age: 17 years     Sex: Male     Is Non-Hispanic African American: No     Diabetic: Yes     Tobacco smoker: No     Systolic Blood Pressure: 710 mmHg     Is BP  treated: Yes     HDL Cholesterol: 46.1 mg/dL     Total Cholesterol: 147 mg/dL  BP Readings from Last 3 Encounters:  07/19/20 124/78  06/28/20 117/78  05/04/20 133/82   Wt Readings from Last 3 Encounters:  07/19/20 236 lb 9.6 oz (107.3 kg)  06/28/20 242 lb 3.2 oz (109.9 kg)  05/04/20 238 lb (108 kg)    Health Maintenance  Topic Date Due  . Hepatitis C Screening  Never done  . HEMOGLOBIN A1C  07/14/2020  . FOOT EXAM  01/11/2021  . OPHTHALMOLOGY EXAM  03/16/2021  . TETANUS/TDAP  12/17/2021  . COLONOSCOPY  07/28/2029  . INFLUENZA VACCINE  Completed  . PNEUMOCOCCAL POLYSACCHARIDE VACCINE AGE 152-64 HIGH RISK  Completed  . COVID-19 Vaccine  Completed  . HIV Screening  Discontinued     Patient Active Problem List   Diagnosis Date Noted  . Frequent PVCs 05/04/2020  . Systolic congestive heart failure with reduced left ventricular function, NYHA class 2 ([place]) 04/24/2020  . Type 2 diabetes mellitus without complication, without long-term current use of insulin ([place]) 11/21/2016  . Hypertension associated with diabetes ([place]) 11/21/2016  . Hyperlipidemia associated with type 2 diabetes mellitus ([place]) 11/21/2016  . 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 Obesity, Class II, BMI 35-39.9 11/21/2016  . Umbilical hernia 62/69/4854   Health Maintenance  Topic Date Due  . Hepatitis C Screening  Never done  . HEMOGLOBIN A1C  07/14/2020  . FOOT EXAM  01/11/2021  . OPHTHALMOLOGY EXAM  03/16/2021  . TETANUS/TDAP  12/17/2021  . COLONOSCOPY  07/28/2029  . INFLUENZA VACCINE  Completed  . PNEUMOCOCCAL POLYSACCHARIDE VACCINE AGE 152-64 HIGH RISK  Completed  . COVID-19 Vaccine  Completed  . HIV Screening  Discontinued   Immunization History  Administered Date(s) Administered  . Influenza Inj Mdck Quad Pf 08/01/2017  . Influenza, Seasonal, Injecte, Preservative Fre 08/01/2017  . Influenza,inj,Quad PF,6+ Mos 12/06/2015, 11/21/2016, 08/22/2018, 07/19/2020  .  Influenza-Unspecified 08/22/2018, 08/01/2019  . PFIZER SARS-COV-2 Vaccination 12/30/2019, 01/20/2020  . Pneumococcal Polysaccharide-23 05/25/2017  . Tdap 12/18/2011   We updated and reviewed the patient's past history in detail and it is documented below. Allergies: Patient has No Known Allergies. Past Medical History  has a past medical history of Chronic right shoulder pain, requiring injection prn (03/20/2017), ED (erectile dysfunction) of organic origin (11/21/2016), Hyperlipidemia, Hyperlipidemia associated with type 2 diabetes mellitus ([place]) (11/21/2016), Hypertension associated with diabetes ([place]) (11/21/2016), Obesity, Class II, BMI 35-39.9 (03/21/7034), Systolic congestive heart failure with reduced left ventricular function, NYHA class 2 ([place]) (04/24/2020), and Type 2 diabetes mellitus without complication, without long-term current use of insulin ([place]) (11/21/2016). Past Surgical History Patient  has a past surgical history that includes Tonsillectomy (1975); Hernia repair (2016);  Vasectomy; and RIGHT/LEFT HEART CATH AND CORONARY ANGIOGRAPHY (N/A, 05/04/2020). Social History Patient  reports that he has never smoked. He quit smokeless tobacco use about 22 years ago.  His smokeless tobacco use included chew. He reports current alcohol use of about 3.0 standard drinks of alcohol per week. He reports that he does not use drugs. Family History family history includes Atrial fibrillation in his father; Colon polyps in his father; Diabetes in his mother; Hypertension in his mother. Review of Systems: Constitutional: negative for fever or malaise Ophthalmic: negative for photophobia, double vision or loss of vision Cardiovascular: negative for chest pain, dyspnea on exertion, or new LE swelling Respiratory: negative for SOB or persistent cough Gastrointestinal: negative for abdominal pain, change in bowel habits or melena Genitourinary: negative for dysuria or gross hematuria Musculoskeletal: negative  for new gait disturbance or muscular weakness Integumentary: negative for new or persistent rashes Neurological: negative for TIA or stroke symptoms Psychiatric: negative for SI or delusions Allergic/Immunologic: negative for hives  Patient Care Team    Relationship Specialty Notifications Start End  Leamon Arnt, MD PCP - General Family Medicine  01/12/20   Pixie Casino, MD Consulting Physician Cardiology  05/03/20    Objective  Vitals: BP 124/78   Pulse 74   Temp (!) 96.3 F (35.7 C) (Temporal)   Resp 16   Ht 5\' 9"  (1.753 m)   Wt 236 lb 9.6 oz (107.3 kg)   SpO2 98%   BMI 34.94 kg/m  General:  Well developed, well nourished, no acute distress  Psych:  Alert and orientedx3,normal mood and affect HEENT:  Normocephalic, atraumatic, non-icteric sclera, PERRL, oropharynx is clear without mass or exudate, supple neck without adenopathy, mass or thyromegaly Cardiovascular:  Normal S1, S2, RRR without gallop, rub or murmur, nondisplaced PMI, +2 distal pulses in bilateral upper and lower extremities. Respiratory:  Good breath sounds bilaterally, CTAB with normal respiratory effort Gastrointestinal: normal bowel sounds, soft, non-tender, no noted masses. No HSM MSK: no deformities, contusions. Joints are without erythema or swelling. Spine and CVA region are nontender Skin:  Warm, no rashes or suspicious lesions noted Neurologic:    Mental status is normal. Gross motor and sensory exams are normal. Stable gait. No tremor GU: No inguinal hernias or adenopathy are appreciated bilaterally   Assessment  1. Annual physical exam   2. Type 2 diabetes mellitus without complication, without long-term current use of insulin ([place])   3. Hyperlipidemia associated with type 2 diabetes mellitus ([place])   4. Systolic congestive heart failure with reduced left ventricular function, NYHA class 2 ([place])   5. Hypertension associated with diabetes ([place])   6. Encounter for hepatitis C screening test for  low risk patient   7. Frequent PVCs   8. Need for immunization against influenza      Plan  Male Wellness Visit:  Age appropriate Health Maintenance and Prevention measures were discussed with patient. Included topics are cancer screening recommendations, ways to keep healthy (see AVS) including dietary and exercise recommendations, regular eye and dental care, use of seat belts, and avoidance of moderate alcohol use and tobacco use.  Screens are up-to-date  BMI: discussed patient's BMI and encouraged positive lifestyle modifications to help get to or maintain a target BMI.  HM needs and immunizations were addressed and ordered. See below for orders. See HM and immunization section for updates.  Flu shot updated today.  He is eligible for the Covid booster vaccine  Routine labs and screening tests ordered  including cmp, cbc and lipids where appropriate.  Discussed recommendations regarding Vit D and calcium supplementation (see AVS)  Chronic disease f/u and/or acute problem visit: (deemed necessary to be done in addition to the wellness visit):  Type 2 diabetes: Discussed with patient backing down her coming off of medications.  He feels like he would like to continue Victoza for weight management, diabetes control and heart failure benefits.  Discussed other options for heart failure benefits and could consider SGLT2 inhibitors.  As well, I would like to clarify whether he still carries a diagnosis.  For now, we will work with him slowly.  Stop Metformin and recheck levels in 3 months.  Patient patient's care plan.  Flu shot updated today.  Heart failure with reduced EF, nonischemic: Has follow-up echo in December.  Continues on Entresto, ARB, and beta-blocker.  Tolerates medications well.  Asymptomatic.  Euvolemic.  Normal renal function.  Recheck hyperlipidemia on statin today.  Obesity: Weight is improving.  Continue clean eating.  Avoidance of alcohol and caffeine  recommended.  Hypertension is well controlled.  Follow up: 3 months for follow-up diabetes  Commons side effects, risks, benefits, and alternatives for medications and treatment plan prescribed today were discussed, and the patient expressed understanding of the given instructions. Patient is instructed to call or message via MyChart if he/she has any questions or concerns regarding our treatment plan. No barriers to understanding were identified. We discussed Red Flag symptoms and signs in detail. Patient expressed understanding regarding what to do in case of urgent or emergency type symptoms.   Medication list was reconciled, printed and provided to the patient in AVS. Patient instructions and summary information was reviewed with the patient as documented in the AVS. This note was prepared with assistance of Dragon voice recognition software. Occasional wrong-word or sound-a-like substitutions may have occurred due to the inherent limitations of voice recognition software  This visit occurred during the SARS-CoV-2 public health emergency.  Safety protocols were in place, including screening questions prior to the visit, additional usage of staff PPE, and extensive cleaning of exam room while observing appropriate contact time as indicated for disinfecting solutions.   Orders Placed This Encounter  Procedures  . Flu Vaccine QUAD 36+ mos IM  . Lipid panel  . Hepatitis C antibody  . POCT HgB A1C   Meds ordered this encounter  Medications  . liraglutide (VICTOZA) 18 MG/3ML SOPN    Sig: Inject 1.8 mg into the skin daily.    Dispense:  27 mL    Refill:  3

## 2020-07-19 NOTE — Patient Instructions (Signed)
Please return in 3 months for diabetes follow up  Stop the metformin and discuss Victoza versus other classes of medications (SGLT-2) for secondary CHF prevention. And we can consider coming down on your medication to see if you still carry the diagnosis of diabetes or need medication to control it.  I will release your lab results to you on your MyChart account with further instructions. Please reply with any questions.   Today you were given your flu vaccination.   If you have any questions or concerns, please don't hesitate to send me a message via MyChart or call the office at [phone]. Thank you for visiting with [place] today! It's our pleasure caring for you.

## 2020-07-20 LAB — LIPID PANEL
Cholesterol: 142 mg/dL (ref <200)
HDL: 47 mg/dL (ref >=40)
LDL Cholesterol (Calc): 73 mg/dL (calc)
Non-HDL Cholesterol (Calc): 95 mg/dL (calc) (ref <130)
Total CHOL/HDL Ratio: 3 (calc) (ref <5.0)
Triglycerides: 137 mg/dL (ref <150)

## 2020-07-20 LAB — HEPATITIS C ANTIBODY
Hepatitis C Ab: NONREACTIVE
SIGNAL TO CUT-OFF: 0.01 (ref <1.00)

## 2020-07-26 ENCOUNTER — Encounter: Payer: Self-pay | Admitting: Neurology

## 2020-08-06 ENCOUNTER — Ambulatory Visit (INDEPENDENT_AMBULATORY_CARE_PROVIDER_SITE_OTHER): Payer: 59 | Admitting: Cardiology

## 2020-08-06 ENCOUNTER — Other Ambulatory Visit: Payer: Self-pay

## 2020-08-06 ENCOUNTER — Encounter: Payer: Self-pay | Admitting: Cardiology

## 2020-08-06 VITALS — BP 110/74 | HR 80 | Ht 69.0 in | Wt 242.2 lb

## 2020-08-06 DIAGNOSIS — I493 Ventricular premature depolarization: Secondary | ICD-10-CM

## 2020-08-06 MED ORDER — MEXILETINE HCL 150 MG PO CAPS
300.0000 mg | ORAL_CAPSULE | Freq: Two times a day (BID) | ORAL | 3 refills | Status: DC
Start: 2020-08-06 — End: 2020-11-28

## 2020-08-06 NOTE — Patient Instructions (Addendum)
Medication Instructions:  Your physician has recommended you make the following change in your medication:  1. STOP Flecainide  1. START Mexiletine 300 mg twice daily  *If you need a refill on your cardiac medications before your next appointment, please call your pharmacy*   Lab Work: None ordered   Testing/Procedures: None ordered   Follow-Up: At [facility], you and your health needs are our priority.  As part of our continuing mission to provide you with exceptional heart care, we have created designated Provider Care Teams.  These Care Teams include your primary Cardiologist (physician) and Advanced Practice Providers (APPs -  Physician Assistants and Nurse Practitioners) who all work together to provide you with the care you need, when you need it.  You are scheduled for a nurse visit EKG on 08/23/20 @ 2:00 pm   Your next appointment:   3 month(s)  The format for your next appointment:   In Person  Provider:   Allegra Lai, MD    Thank you for choosing [facility]!!   Trinidad Curet, RN [phone]   Other Instructions  Mexiletine capsules What is this medicine? MEXILETINE (mex IL e teen) is an antiarrhythmic agent. This medicine is used to treat irregular heart rhythm and can slow rapid heartbeats. It can help your heart to return to and maintain a normal rhythm. Because of the side effects caused by this medicine, it is usually used for heartbeat problems that may be life-threatening. This medicine may be used for other purposes; ask your health care provider or pharmacist if you have questions. COMMON BRAND NAME(S): Mexitil What should I tell my health care provider before I take this medicine? They need to know if you have any of these conditions:  liver disease  other heart problems  previous heart attack  an unusual or allergic reaction to mexiletine, other medicines, foods, dyes, or preservatives  pregnant or trying to get  pregnant  breast-feeding How should I use this medicine? Take this medicine by mouth with a glass of water. Follow the directions on the prescription label. It is recommended that you take this medicine with food or an antacid. Take your doses at regular intervals. Do not take your medicine more often than directed. Do not stop taking except on the advice of your doctor or health care professional. Talk to your pediatrician regarding the use of this medicine in children. Special care may be needed. Overdosage: If you think you have taken too much of this medicine contact a poison control center or emergency room at once. NOTE: This medicine is only for you. Do not share this medicine with others. What if I miss a dose? If you miss a dose, take it as soon as you can. If it is almost time for your next dose, take only that dose. Do not take double or extra doses. What may interact with this medicine? Do not take this medicine with any of the following medications:  dofetilide This medicine may also interact with the following medications:  caffeine  cimetidine  medicines for depression, anxiety, or psychotic disturbances  medicines to control heart rhythm  phenobarbital  phenytoin  rifampin  theophylline This list may not describe all possible interactions. Give your health care provider a list of all the medicines, herbs, non-prescription drugs, or dietary supplements you use. Also tell them if you smoke, drink alcohol, or use illegal drugs. Some items may interact with your medicine. What should I watch for while using this medicine?  Your condition will be monitored closely when you first begin therapy. Often, this drug is first started in a hospital or other monitored health care setting. Once you are on maintenance therapy, visit your doctor or health care provider for regular checks on your progress. Because your condition and use of this medicine carry some risk, it is a good  idea to carry an identification card, necklace or bracelet with details of your condition, medications, and doctor or health care provider. You may get drowsy or dizzy. Do not drive, use machinery, or do anything that needs mental alertness until you know how this medicine affects you. Do not stand or sit up quickly, especially if you are an older patient. This reduces the risk of dizzy or fainting spells. Alcohol can make you more dizzy, increase flushing and rapid heartbeats. Avoid alcoholic drinks. This medicine may cause serious skin reactions. They can happen weeks to months after starting the medicine. Contact your health care provider right away if you notice fevers or flu-like symptoms with a rash. The rash may be red or purple and then turn into blisters or peeling of the skin. Or, you might notice a red rash with swelling of the face, lips or lymph nodes in your neck or under your arms. What side effects may I notice from receiving this medicine? Side effects that you should report to your doctor or health care professional as soon as possible:  allergic reactions like skin rash, itching or hives, swelling of the face, lips, or tongue  breathing problems  chest pain, continued irregular heartbeats  rash, fever, and swollen lymph nodes  redness, blistering, peeling or loosening of the skin, including inside the mouth  seizures  skin rash  trembling, shaking  unusual bleeding or bruising  unusually weak or tired Side effects that usually do not require medical attention (report to your doctor or health care professional if they continue or are bothersome):  blurred vision  difficulty walking  heartburn  nausea, vomiting  nervousness  numbness, or tingling in the fingers or toes This list may not describe all possible side effects. Call your doctor for medical advice about side effects. You may report side effects to FDA at 1-800-FDA-1088. Where should I keep my  medicine? Keep out of reach of children. Store at room temperature between 15 and 30 degrees C (59 and 86 degrees F). Throw away any unused medicine after the expiration date. NOTE: This sheet is a summary. It may not cover all possible information. If you have questions about this medicine, talk to your doctor, pharmacist, or health care provider.  2020 Elsevier/Gold Standard (2019-01-12 09:25:42)

## 2020-08-06 NOTE — Progress Notes (Signed)
Electrophysiology Office Note   Date:  08/06/2020   ID:  Gary Berry, DOB 1971/03/26, MRN [id number]  PCP:  Leamon Arnt, MD  Cardiologist: Debara Pickett Primary Electrophysiologist:  Ethelreda Sukhu Meredith Leeds, MD    Chief Complaint: PVC   History of Present Illness: Gary Berry is a 49 y.o. male who is being seen today for the evaluation of PVC at the request of Hilty, Nadean Corwin, MD. Presenting today for electrophysiology evaluation.  He has a history significant for hypertension, hyperlipidemia, obesity, PVCs, and a nonischemic cardiomyopathy.  He was noted to have palpitations and wore a Holter monitor that showed an elevated PVC burden.  He had an echo that showed an ejection fraction of 35 to 40%.  Today, he denies symptoms of palpitations, chest pain, shortness of breath, orthopnea, PND, lower extremity edema, claudication, dizziness, presyncope, syncope, bleeding, or neurologic sequela. The patient is tolerating medications without difficulties.  He currently feels well.  He has no chest pain or shortness of breath.  He is able to do all his daily activities.  He was unaware of his arrhythmia until he went to give blood and was found to have an irregular rhythm.  He wore a cardiac monitor that showed a PVC burden of 19%.  He had an echo that showed a reduced ejection fraction and a cardiac catheterization without evidence of coronary artery disease.  He continues to not have any symptoms from his PVCs.   Past Medical History:  Diagnosis Date  . Chronic right shoulder pain, requiring injection prn 03/20/2017  . 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 Hyperlipidemia   . Hyperlipidemia associated with type 2 diabetes mellitus ([place]) 11/21/2016  . Hypertension associated with diabetes ([place]) 11/21/2016  . Obesity, Class II, BMI 35-39.9 11/21/2016  . Systolic congestive heart failure with reduced left ventricular function, NYHA class 2 ([place]) 04/24/2020   Echocardiogram 04/2020:  Ef  35-40%, global hypokinesis  . Type 2 diabetes mellitus without complication, without long-term current use of insulin ([place]) 11/21/2016   Past Surgical History:  Procedure Laterality Date  . HERNIA REPAIR  2016   hard to wake up after sx  . RIGHT/LEFT HEART CATH AND CORONARY ANGIOGRAPHY N/A 05/04/2020   Procedure: RIGHT/LEFT HEART CATH AND CORONARY ANGIOGRAPHY;  Surgeon: Leonie Man, MD;  Location: [facility];  Service: Cardiovascular;  Laterality: N/A;  . TONSILLECTOMY  1975  . VASECTOMY       Current Outpatient Medications  Medication Sig Dispense Refill  . aspirin EC 81 MG tablet Take 81 mg by mouth daily. Swallow whole.    . B-D UF III MINI PEN NEEDLES 31G X 5 MM MISC See admin instructions.    . calcium carbonate (TUMS - DOSED IN MG ELEMENTAL CALCIUM) 500 MG chewable tablet Chew 1,000 mg by mouth daily as needed for indigestion or heartburn.    . cetirizine (ZYRTEC) 10 MG tablet Take 10 mg by mouth daily as needed for allergies.    . Cholecalciferol (VITAMIN D) 125 MCG (5000 UT) CAPS Take 5,000 Units by mouth daily.     . Cyanocobalamin (VITAMIN B12) 1000 MCG TBCR Take 1,000 mcg by mouth daily.     . flecainide (TAMBOCOR) 100 MG tablet Take 1 tablet (100 mg total) by mouth 2 (two) times daily. 180 tablet 3  . ibuprofen (ADVIL) 200 MG tablet Take 400 mg by mouth every 6 (six) hours as needed for headache or moderate pain.    Marland Kitchen liraglutide (VICTOZA)  18 MG/3ML SOPN Inject 1.8 mg into the skin daily. 27 mL 3  . metoprolol succinate (TOPROL-XL) 25 MG 24 hr tablet Take 1 tablet (25 mg total) by mouth daily. 90 tablet 3  . naproxen sodium (ALEVE) 220 MG tablet Take 220 mg by mouth daily as needed (pain).    . Omega-3 Fatty Acids (FISH OIL) 1000 MG CAPS Take 2,000 mg by mouth daily.    . sacubitril-valsartan (ENTRESTO) 24-26 MG Take 1 tablet by mouth 2 (two) times daily. 60 tablet 5  . simvastatin (ZOCOR) 20 MG tablet Take 1 tablet (20 mg total) by mouth daily at 6 PM. 90 tablet 3   . tadalafil (CIALIS) 5 MG tablet TAKE 1 TABLET BY MOUTH DAILY AS NEEDED FOR ERECTILE DYSFUNCTION (Patient taking differently: Take 5 mg by mouth daily as needed for erectile dysfunction. ) 90 tablet 1   No current facility-administered medications for this visit.    Allergies:   Patient has no known allergies.   Social History:  The patient  reports that he has never smoked. He quit smokeless tobacco use about 22 years ago.  His smokeless tobacco use included chew. He reports current alcohol use of about 3.0 standard drinks of alcohol per week. He reports that he does not use drugs.   Family History:  The patient's family history includes Atrial fibrillation in his father; Colon polyps in his father; Diabetes in his mother; Hypertension in his mother.    ROS:  Please see the history of present illness.   Otherwise, review of systems is positive for none.   All other systems are reviewed and negative.    PHYSICAL EXAM: VS:  BP 110/74   Pulse 80   Ht 5\' 9"  (1.753 m)   Wt 242 lb 3.2 oz (109.9 kg)   SpO2 97%   BMI 35.77 kg/m  , BMI Body mass index is 35.77 kg/m. GEN: Well nourished, well developed, in no acute distress  HEENT: normal  Neck: no JVD, carotid bruits, or masses Cardiac: RRR; no murmurs, rubs, or gallops,no edema  Respiratory:  clear to auscultation bilaterally, normal work of breathing GI: soft, nontender, nondistended, + BS MS: no deformity or atrophy  Skin: warm and dry Neuro:  Strength and sensation are intact Psych: euthymic mood, full affect  EKG:  EKG is ordered today. Personal review of the ekg ordered shows sinus rhythm, PVCs  Recent Labs: 03/30/2020: ALT 22; TSH 1.28 05/01/2020: Platelets 248 05/04/2020: Hemoglobin 14.6; Hemoglobin 14.6 05/24/2020: BUN 13; Creatinine, Ser 0.95; Potassium 4.9; Sodium 143    Lipid Panel     Component Value Date/Time   CHOL 142 07/19/2020 0905   TRIG 137 07/19/2020 0905   HDL 47 07/19/2020 0905   CHOLHDL 3.0 07/19/2020  0905   VLDL 54.6 (H) 08/02/2019 0919   LDLCALC 73 07/19/2020 0905   LDLDIRECT 69.0 08/02/2019 0919     Wt Readings from Last 3 Encounters:  08/06/20 242 lb 3.2 oz (109.9 kg)  07/19/20 236 lb 9.6 oz (107.3 kg)  06/28/20 242 lb 3.2 oz (109.9 kg)      Other studies Reviewed: Additional studies/ records that were reviewed today include: TTE 04/19/20  Review of the above records today demonstrates:  1. Left ventricular ejection fraction, by estimation, is 35 to 40%. The  left ventricle has moderately decreased function. The left ventricle  demonstrates global hypokinesis. There is moderate concentric left  ventricular hypertrophy. Left ventricular  diastolic parameters are consistent with Grade I diastolic dysfunction  (  impaired relaxation).  2. Right ventricular systolic function is moderately reduced. The right  ventricular size is moderately enlarged. There is normal pulmonary artery  systolic pressure.  3. Left atrial size was moderately dilated.  4. The mitral valve is normal in structure. No evidence of mitral valve  regurgitation. No evidence of mitral stenosis.  5. The aortic valve is normal in structure. Aortic valve regurgitation is  trivial. No aortic stenosis is present.  6. The inferior vena cava is normal in size with greater than 50%  respiratory variability, suggesting right atrial pressure of 3 mmHg.   Cardiac monitor 07/03/2020 personally reviewed Sinus rhythm with frequent PVC's at 14% burden.   ASSESSMENT AND PLAN:  1.  PVCs: Fortunately a normal coronaries by recent catheterization.  His ejection fraction is low and this could be due to a PVC induced cardiomyopathy.  Repeat cardiac monitor shows a 14% burden.  He is unaware of his PVCs.  Unfortunately they are not suppressed with flecainide.  Due to that we Yuval Rubens start him on mexiletine 300 mg twice daily.  I Ariah Mower have him return to clinic in 2 weeks for an ECG with a long rhythm strip.  If he continues to  have PVCs, he would be open to ablation.  Risks and benefits of ablation were discussed.  Risk include bleeding, tamponade, heart block, stroke.  The patient understands these risks and would agree to the procedure.  2.  Nonischemic cardiomyopathy: Potentially PVC induced.  Currently on Toprol-XL and Entresto per primary cardiology.  3.  Hypertension: Currently well controlled  Case discussed with primary cardiology  Current medicines are reviewed at length with the patient today.   The patient does not have concerns regarding his medicines.  The following changes were made today:  none  Labs/ tests ordered today include:  Orders Placed This Encounter  Procedures  . EKG 12-Lead     Disposition:   FU with Rossanna Spitzley 3 months  Signed, Shae Hinnenkamp Meredith Leeds, MD  08/06/2020 9:27 AM     [facility] [address] [place] [address] [address] [phone] (office) [phone] (fax)

## 2020-08-09 ENCOUNTER — Ambulatory Visit: Payer: 59 | Admitting: Neurology

## 2020-08-16 ENCOUNTER — Ambulatory Visit: Payer: 59 | Admitting: Neurology

## 2020-08-18 ENCOUNTER — Ambulatory Visit: Payer: 59 | Attending: Family Medicine

## 2020-08-18 DIAGNOSIS — Z23 Encounter for immunization: Secondary | ICD-10-CM

## 2020-08-18 NOTE — Progress Notes (Signed)
   Covid-19 Vaccination Clinic  Name:  Osher Oettinger    MRN: [id number] DOB: 01-15-1971  08/18/2020  Mr. Rennels was observed post Covid-19 immunization for 15 minutes without incident. He was provided with Vaccine Information Sheet and instruction to access the V-Safe system.   Mr. Oddo was instructed to call 911 with any severe reactions post vaccine: Marland Kitchen Difficulty breathing  . Swelling of face and throat  . A fast heartbeat  . A bad rash all over body  . Dizziness and weakness

## 2020-08-23 ENCOUNTER — Encounter: Payer: Self-pay | Admitting: *Deleted

## 2020-08-23 ENCOUNTER — Ambulatory Visit (INDEPENDENT_AMBULATORY_CARE_PROVIDER_SITE_OTHER): Payer: 59 | Admitting: *Deleted

## 2020-08-23 ENCOUNTER — Other Ambulatory Visit: Payer: Self-pay

## 2020-08-23 VITALS — BP 110/68 | HR 68 | Ht 69.0 in | Wt 239.0 lb

## 2020-08-23 DIAGNOSIS — I493 Ventricular premature depolarization: Secondary | ICD-10-CM

## 2020-08-23 NOTE — Progress Notes (Signed)
1.) Reason for visit: Rhythm strip  2.) Name of MD requesting visit: Camnitz  3.) H&P: Mexiletine started 2 weeks ago.   4.) ROS related to problem: Patient denies any complaints  5.) Assessment and plan per MD: EKG and rhythm strip reviewed by Dr Curt Bears. Per Dr Curt Bears looks good and patient to continue Mexiletine.

## 2020-08-29 ENCOUNTER — Telehealth: Payer: Self-pay

## 2020-08-29 ENCOUNTER — Encounter: Payer: Self-pay | Admitting: Neurology

## 2020-08-29 ENCOUNTER — Ambulatory Visit (INDEPENDENT_AMBULATORY_CARE_PROVIDER_SITE_OTHER): Payer: 59 | Admitting: Neurology

## 2020-08-29 VITALS — BP 127/80 | HR 77 | Ht 69.0 in | Wt 241.5 lb

## 2020-08-29 DIAGNOSIS — Z9989 Dependence on other enabling machines and devices: Secondary | ICD-10-CM

## 2020-08-29 DIAGNOSIS — G4733 Obstructive sleep apnea (adult) (pediatric): Secondary | ICD-10-CM | POA: Diagnosis not present

## 2020-08-29 NOTE — Telephone Encounter (Signed)
Patient came by sleep lab after office visit to get fitted for  Anew mask. His current mask( F&P simplus) is causing a rash on his face. I fitted him withe the res med [employer] with the foam face covering. This fitted him well and he liked the mask. I also fitted him with the res med F30.  He liked the mask went under nose and over mouth. I sent both home with him to try. I will follow up in week and see if either one of these mask helps.

## 2020-08-29 NOTE — Progress Notes (Signed)
Subjective:    Patient ID: Gary Berry is a 49 y.o. male.  HPI     Interim history:   Gary Berry is a 49 year old right-handed gentleman with an underlying medical history of hypertension, diabetes, hyperlipidemia, chronic shoulder pain, chronic systolic congestive heart failure and obesity, who Presents for follow-up consultation of his obstructive sleep apnea after baseline sleep testing and starting AutoPap therapy.  The patient is unaccompanied today.  I first met him at the request of his primary care physician, at which time he reported snoring and sleep disruption.  He had recently been diagnosed with CHF and was found to have frequent PVCs.  He was advised to proceed with a sleep study.  He had a baseline sleep study on 05/20/2020 which showed a sleep latency of 35.5 minutes, REM latency 82 minutes, sleep efficiency 89.3%.  He had an increased percentage of stage I sleep but otherwise fairly normal sleep stage percentages, some sleep fragmentation was noted.  Total AHI was borderline at 5.1/h, REM AHI was in the mild range at 12.7/h, supine AHI also in the mild range at 13/h.  Average oxygen saturation 98%, nadir was 90%.  He had no significant PLM's.  EKG showed frequent PVCs including bigeminy and trigeminy.  He was advised to start AutoPap therapy given his cardiac history. His set up date was 07/26/20.   Today, 08/29/2020: I reviewed his AutoPap compliance data from 07/28/2020 through 08/26/2020, which is a total of 30 days, during which time he used his machine every night with percent use days greater than 4 hours at 100%, indicating superb compliance with an average usage of 8 hours and 12 minutes, residual AHI 3.6/h, 95th percentile of pressure at 10.4 cm with a range of 6 to 11 cm with EPR of 3, leak on the low side with a 95th percentile at 2.5 L/min.  He reports using his machine regularly but has not noticed any difference in his sleep quality or daytime symptoms.  He met with Dr.  Curt Berry on 08/06/2020 for evaluation of his frequent PVCs and ablation was discussed.  Since flecainide was not helping enough he was advised to switch to mexiletine.  He has an appointment with his cardiologist coming up in December.  He is using a full facemask for his AutoPap machine but has broken out in a rash.  It is not itchy or painful but quite noticeable.  It is slightly better after he started washing his mask every day.  He has also used hydrocortisone cream on his face which has helped.  He uses a Simplus medium size full facemask as he recalls.  He would be willing to try something different.  The patient's allergies, current medications, family history, past medical history, past social history, past surgical history and problem list were reviewed and updated as appropriate.   Previously:   04/30/20: (He) reports snoring and some sleep disruption.  He was recently found to have worsening left ventricular function with an EF estimated around 30 to 40%.  He was recently found to have PVCs.  He is scheduled to see cardiology tomorrow.  He denies any chest pain, shortness of breath or palpitations.  He has nocturia about once or twice per average night, denies recurrent morning headaches.  Father has sleep apnea and has a CPAP machine and recently also had open heart surgery for quadruple bypass.  Patient reports going to bed around 830 but does not typically fall asleep until 10 or even 11, rise time  is around 7.  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  He lives with his wife, currently have 4 cats in the household, their son is grown, age 31.  [name], has reduced his caffeine intake recently and limits himself currently to 1 cup of coffee per day.  He drinks alcohol in the form of beer or wine, 6 or 7 servings per average week. His Epworth sleepiness score 4 out of 24, fatigue severity score is 17/63.   His Past Medical  History Is Significant For: Past Medical History:  Diagnosis Date  . Chronic right shoulder pain, requiring injection prn 03/20/2017  . 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 Hyperlipidemia   . Hyperlipidemia associated with type 2 diabetes mellitus ([place]) 11/21/2016  . Hypertension associated with diabetes ([place]) 11/21/2016  . Obesity, Class II, BMI 35-39.9 11/21/2016  . Systolic congestive heart failure with reduced left ventricular function, NYHA class 2 ([place]) 04/24/2020   Echocardiogram 04/2020:  Ef 35-40%, global hypokinesis  . Type 2 diabetes mellitus without complication, without long-term current use of insulin ([place]) 11/21/2016    His Past Surgical History Is Significant For: Past Surgical History:  Procedure Laterality Date  . HERNIA REPAIR  2016   hard to wake up after sx  . RIGHT/LEFT HEART CATH AND CORONARY ANGIOGRAPHY N/A 05/04/2020   Procedure: RIGHT/LEFT HEART CATH AND CORONARY ANGIOGRAPHY;  Surgeon: Gary Man, MD;  Location: [place] [facility];  Service: Cardiovascular;  Laterality: N/A;  . TONSILLECTOMY  1975  . VASECTOMY      His Family History Is Significant For: Family History  Problem Relation Age of Onset  . Diabetes Mother   . Hypertension Mother   . Atrial fibrillation Father   . Colon polyps Father   . Colon cancer Neg Hx   . Esophageal cancer Neg Hx   . Rectal cancer Neg Hx   . Stomach cancer Neg Hx     His Social History Is Significant For: Social History   Socioeconomic History  . Marital status: Married    Spouse name: Gary Nip, MD  . Number of children: 1  . Years of education: BA  . Highest education level: Not on file  Occupational History  . Occupation: Primary school teacher: [employer]  Tobacco Use  . Smoking status: Never Smoker  . Smokeless tobacco: Former Systems developer    Types: Secondary school teacher  . Vaping Use: Never used  Substance and Sexual Activity  . Alcohol use: Yes    Alcohol/week: 3.0 standard drinks    Types: 3  Standard drinks or equivalent per week    Comment: social  . Drug use: No  . Sexual activity: Yes    Partners: Female  Other Topics Concern  . Not on file  Social History Narrative   Caffeine use: 2 cups coffee per day   Caffeine free soda      Right handed    Social Determinants of Health   Financial Resource Strain:   . Difficulty of Paying Living Expenses: Not on file  Food Insecurity:   . Worried About Charity fundraiser in the Last Year: Not on file  . Ran Out of Food in the Last Year: Not on file  Transportation Needs:   . Lack of Transportation (Medical): Not on file  . Lack of Transportation (Non-Medical): Not on file  Physical Activity:   .  Days of Exercise per Week: Not on file  . Minutes of Exercise per Session: Not on file  Stress:   . Feeling of Stress : Not on file  Social Connections:   . Frequency of Communication with Friends and Family: Not on file  . Frequency of Social Gatherings with Friends and Family: Not on file  . Attends Religious Services: Not on file  . Active Member of Clubs or Organizations: Not on file  . Attends Archivist Meetings: Not on file  . Marital Status: Not on file    His Allergies Are:  No Known Allergies:   His Current Medications Are:  Outpatient Encounter Medications as of 08/29/2020  Medication Sig  . aspirin EC 81 MG tablet Take 81 mg by mouth daily. Swallow whole.  . B-D UF III MINI PEN NEEDLES 31G X 5 MM MISC See admin instructions.  . calcium carbonate (TUMS - DOSED IN MG ELEMENTAL CALCIUM) 500 MG chewable tablet Chew 1,000 mg by mouth daily as needed for indigestion or heartburn.  . cetirizine (ZYRTEC) 10 MG tablet Take 10 mg by mouth daily as needed for allergies.  . Cholecalciferol (VITAMIN D) 125 MCG (5000 UT) CAPS Take 5,000 Units by mouth daily.   . Cyanocobalamin (VITAMIN B12) 1000 MCG TBCR Take 1,000 mcg by mouth daily.   Marland Kitchen ibuprofen (ADVIL) 200 MG tablet Take 400 mg by mouth every 6 (six) hours as  needed for headache or moderate pain.  Marland Kitchen liraglutide (VICTOZA) 18 MG/3ML SOPN Inject 1.8 mg into the skin daily.  . metoprolol succinate (TOPROL-XL) 25 MG 24 hr tablet Take 1 tablet (25 mg total) by mouth daily.  Marland Kitchen mexiletine (MEXITIL) 150 MG capsule Take 2 capsules (300 mg total) by mouth 2 (two) times daily.  . naproxen sodium (ALEVE) 220 MG tablet Take 220 mg by mouth daily as needed (pain).  . Omega-3 Fatty Acids (FISH OIL) 1000 MG CAPS Take 2,000 mg by mouth daily.  . sacubitril-valsartan (ENTRESTO) 24-26 MG Take 1 tablet by mouth 2 (two) times daily.  . simvastatin (ZOCOR) 20 MG tablet Take 1 tablet (20 mg total) by mouth daily at 6 PM.  . tadalafil (CIALIS) 5 MG tablet TAKE 1 TABLET BY MOUTH DAILY AS NEEDED FOR ERECTILE DYSFUNCTION (Patient taking differently: Take 5 mg by mouth daily as needed for erectile dysfunction. )   No facility-administered encounter medications on file as of 08/29/2020.  :  Review of Systems:  Out of a complete 14 point review of systems, all are reviewed and negative with the exception of these symptoms as listed below: Review of Systems  Neurological:       Rm 1, alone. Here for CPAP f/u. Face mask made him break out but has changed to washing it daily and this has helped. Does not notice any difference in feeling more rested since starting CPAP.    Objective:  Neurological Exam  Physical Exam Physical Examination:   Vitals:   08/29/20 0918  BP: 127/80  Pulse: 77  SpO2: 98%    General Examination: The patient is a very pleasant 49 y.o. male in no acute distress. He appears well-developed and well-nourished and well groomed.   HEENT: Normocephalic, atraumatic, pupils are equal, round and reactive to light, extraocular tracking is good without limitation to gaze excursion or nystagmus noted. Hearing is grossly intact. Face is symmetric with normal facial animation. Speech is clear with no dysarthria noted. There is no hypophonia. There is no lip,  neck/head, jaw  or voice tremor. Neck is supple with full range of passive and active motion. There are no carotid bruits on auscultation. Oropharynx exam reveals: no signif. mouth dryness, good dental hygiene and moderate airway crowding, due to larger uvula and wider tongue.  Tonsils absent, Mallampati class II.  Neck circumference of 17-7/8 inches.  Tonsils absent.  He has minimal to no overbite.  Chest: Clear to auscultation without wheezing, rhonchi or crackles noted.  Heart: S1+S2+0, regular and normal without murmurs, rubs or gallops noted.   Abdomen: Soft, non-tender and non-distended with normal bowel sounds appreciated on auscultation.  Extremities: There is no pitting edema in the distal lower extremities bilaterally.   Skin: Warm and dry without trophic changes noted.   Musculoskeletal: exam reveals no obvious joint deformities, tenderness or joint swelling or erythema.   Neurologically:  Mental status: The patient is awake, alert and oriented in all 4 spheres. His immediate and remote memory, attention, language skills and fund of knowledge are appropriate. There is no evidence of aphasia, agnosia, apraxia or anomia. Speech is clear with normal prosody and enunciation. Thought process is linear. Mood is normal and affect is normal.  Cranial nerves II - XII are as described above under HEENT exam.  Motor exam: Normal bulk, strength and tone is noted. There is no tremor, Romberg is negative. Fine motor skills and coordination: grossly intact.  Cerebellar testing: No dysmetria or intention tremor. There is no truncal or gait ataxia.  Sensory exam: intact to light touch in the upper and lower extremities.  Gait, station and balance: He stands easily. No veering to one side is noted. No leaning to one side is noted. Posture is age-appropriate and stance is narrow based. Gait shows normal stride length and normal pace. No problems turning are noted. Tandem walk is unremarkable.                 Assessment and Plan:  In summary, Gary Berry is a very pleasant 49 year old male with an underlying medical history of hypertension, diabetes, hyperlipidemia, chronic shoulder pain, chronic systolic congestive heart failure, obesity, and frequent PVCs, who presents for follow-up consultation of his sleep disordered breathing.  Baseline sleep testing from June 08, 2028 suggested rather mild sleep apnea with an AHI of 5.1/h, O2 nadir of 90%.  He has established treatment with AutoPap therapy since July 26, 2020 and is fully compliant with treatment.  He is highly commended for his treatment adherence.  He has not noticed any telltale improvement in his symptoms but treatment was primarily initiated on the basis of his cardiac history.  He had a recent medication change and has started mexiletine.  He has a follow-up appointment with his cardiologist pending in December.  He also met with Dr. Curt Berry for the possibility of an ablation if need be as he has a high PVC load.  He is advised to continue with treatment and also discussed with his cardiologist the treatment of his sleep apnea and whether it would help his congestive heart failure and his PVCs.  He has developed a rash on the face from his full facemask.  He was able to stop in the sleep lab and was fitted with 2 different styles of masks and was given samples to take home.  If he likes 1 over the other we can change his prescription in that regard.  He is advised to continue with treatment and follow-up routinely in this clinic in 6 months, sooner if needed.  I  answered all his questions today and he was in agreement with the plan.   I spent 30 minutes in total face-to-face time and in reviewing records during pre-charting, more than 50% of which was spent in counseling and coordination of care, reviewing test results, reviewing medications and treatment regimen and/or in discussing or reviewing the diagnosis of OSA, the prognosis and  treatment options. Pertinent laboratory and imaging test results that were available during this visit with the patient were reviewed by me and considered in my medical decision making (see chart for details).

## 2020-08-29 NOTE — Patient Instructions (Addendum)
You are fully compliant with your AutoPap.  You have done a great job using it.  Unfortunately, you do have a rash, likely from the silicone in your mask.  I would like for you to try something different, such as the ResMed air touch, which has a foam/memory foam rim. We may be able to give you a sample.  Please stop in the sleep lab to talk to [place], our sleep lab manager about different options for you, there is an under the nose and mouth option called ResMed F 30i or DreamWear full facemask that may be of benefit for you.  Both do have silicone on them as well though, as far as I recall.  Please continue using your AutoPap for now.  Please also discuss with your cardiologist and ablation specialist the use of AutoPap therapy for your borderline sleep apnea, if they recommended we will maintain you on treatment.  Let's follow-up in 6 months, sooner if needed.

## 2020-09-03 ENCOUNTER — Other Ambulatory Visit: Payer: Self-pay | Admitting: Internal Medicine

## 2020-09-05 ENCOUNTER — Telehealth: Payer: Self-pay

## 2020-09-05 NOTE — Telephone Encounter (Signed)
Called  patient to see which cpap mask he liked of the two I gave him to try at mask fitting. He likes the Res med Air touch medium full face mask. He is doing great on this with no rash. He will get this one the next time he is due for a mask. Cyril Mourning can you send email to his DME so they can document the change?

## 2020-09-05 NOTE — Telephone Encounter (Signed)
Informed [facility] via community message.

## 2020-09-27 ENCOUNTER — Ambulatory Visit (INDEPENDENT_AMBULATORY_CARE_PROVIDER_SITE_OTHER): Payer: 59 | Admitting: Internal Medicine

## 2020-09-27 ENCOUNTER — Ambulatory Visit (HOSPITAL_COMMUNITY): Payer: 59 | Attending: Cardiovascular Disease

## 2020-09-27 ENCOUNTER — Other Ambulatory Visit: Payer: Self-pay

## 2020-09-27 ENCOUNTER — Encounter: Payer: Self-pay | Admitting: Internal Medicine

## 2020-09-27 VITALS — BP 128/92 | HR 64 | Ht 69.0 in | Wt 241.6 lb

## 2020-09-27 DIAGNOSIS — Z79899 Other long term (current) drug therapy: Secondary | ICD-10-CM | POA: Diagnosis not present

## 2020-09-27 DIAGNOSIS — I493 Ventricular premature depolarization: Secondary | ICD-10-CM

## 2020-09-27 DIAGNOSIS — I428 Other cardiomyopathies: Secondary | ICD-10-CM

## 2020-09-27 DIAGNOSIS — I5020 Unspecified systolic (congestive) heart failure: Secondary | ICD-10-CM

## 2020-09-27 LAB — ECHOCARDIOGRAM COMPLETE
Area-P 1/2: 3.91 cm2
S' Lateral: 4.7 cm

## 2020-09-27 MED ORDER — SACUBITRIL-VALSARTAN 49-51 MG PO TABS: 1.0000 | ORAL_TABLET | Freq: Two times a day (BID) | ORAL | 3 refills | Status: DC

## 2020-09-27 MED ORDER — PERFLUTREN LIPID MICROSPHERE
1.0000 mL | INTRAVENOUS | Status: AC | PRN
  Administered 2020-09-27: 2 mL via INTRAVENOUS

## 2020-09-27 NOTE — Progress Notes (Signed)
OFFICE CONSULT NOTE  Chief Complaint:  Follow-up heart failure  Primary Care Physician: Leamon Arnt, MD  HPI:  Gary Berry is a 49 y.o. male who is being seen today for the evaluation of heart failure at the request of Leamon Arnt, MD.  This is a pleasant 49 year old male whose wife is at [place].  He was referred for evaluation of newly recognized systolic congestive heart failure.  Apparently he had been having some palpitations which were evaluated by his PCP.  An echocardiogram was ordered as well as a Holter monitor.  He was found to have some PVCs on and off his EKG.  The echocardiogram was performed on 04/19/2020 and indicated an LVEF of 35 to 40% with global hypokinesis and moderate LVH, grade 1 diastolic dysfunction and moderate RV systolic dysfunction.  Pressure and IVC were normal with moderate left atrial enlargement.  Despite these findings, [name] has no heart failure symptoms.  He denies worsening shortness of breath, orthopnea, lower extremity edema, chest pain, presyncope, syncopal episodes or any other associated symptoms.  He has had palpitations as mentioned and was noted to have PVCs.  The Holter monitor result is not yet available to determine his burden of PVCs.  According to his wife he also has a history of some alcohol use and was drinking about 3-4 drinks a night which had picked up since the beginning of the Covid pandemic.  Apparently before that it was much less.  He has recently decreased this.  In addition he was described as a very heavy caffeine user and recently has been decreasing that as well.  There is heart disease in his family including his father who had heart disease in his 18s.  He also has type 2 diabetes without insulin use, hypertension and mild obesity.  Recent hemoglobin A1c was 5.3.  Direct LDL in October was 69, however triglycerides were elevated at 273.  06/28/2020  [name] returns today for follow-up.  Overall he  continues to feel well.  He is generally unaware of his heart failure or PVCs.  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  There is also a right bundle branch block which has been stable even prior to starting flecainide.  QTc is 431 ms.  Repeat lab work on [place] showed normal creatinine and blood pressure is excellent at 118/78.  09/27/2020  [name] returns today for follow-up. Overall he is continuing to feel well. In fact he says he is never felt unwell. He saw Dr. Curt Bears who put him on mexiletine. He seems to be tolerating this and has had improvement in his PVCs. I did not detect any on his EKG today. He had an echo earlier this morning which had not been formally read. My review suggest that there is improvement in LV function possibly up to 45 to 50%. The suggest that overall he seems to be responding to therapy. I think there is still blood pressure room to consider up titrating his Entresto.  PMHx:  Past Medical History:  Diagnosis Date  . Chronic right shoulder pain, requiring injection prn 03/20/2017  . 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 Hyperlipidemia   . Hyperlipidemia associated with type 2 diabetes mellitus ([place]) 11/21/2016  . Hypertension associated with diabetes ([place]) 11/21/2016  . Obesity, Class II, BMI  35-39.9 11/21/2016  . Systolic congestive heart failure with reduced left ventricular function, NYHA class 2 ([place]) 04/24/2020   Echocardiogram 04/2020:  Ef 35-40%, global hypokinesis  . Type 2 diabetes mellitus without complication, without long-term current use of insulin ([place]) 11/21/2016    Past Surgical History:  Procedure Laterality Date  . HERNIA REPAIR  2016   hard to wake up after sx  . RIGHT/LEFT HEART CATH AND CORONARY ANGIOGRAPHY N/A 05/04/2020   Procedure: RIGHT/LEFT HEART CATH AND CORONARY  ANGIOGRAPHY;  Surgeon: Leonie Man, MD;  Location: [facility];  Service: Cardiovascular;  Laterality: N/A;  . TONSILLECTOMY  1975  . VASECTOMY      FAMHx:  Family History  Problem Relation Age of Onset  . Diabetes Mother   . Hypertension Mother   . Atrial fibrillation Father   . Colon polyps Father   . Colon cancer Neg Hx   . Esophageal cancer Neg Hx   . Rectal cancer Neg Hx   . Stomach cancer Neg Hx     SOCHx:   reports that he has never smoked. He quit smokeless tobacco use about 22 years ago.  His smokeless tobacco use included chew. He reports current alcohol use of about 3.0 standard drinks of alcohol per week. He reports that he does not use drugs.  ALLERGIES:  No Known Allergies  ROS: Pertinent items noted in HPI and remainder of comprehensive ROS otherwise negative.  HOME MEDS: Current Outpatient Medications on File Prior to Visit  Medication Sig Dispense Refill  . aspirin EC 81 MG tablet Take 81 mg by mouth daily. Swallow whole.    . B-D UF III MINI PEN NEEDLES 31G X 5 MM MISC See admin instructions.    . calcium carbonate (TUMS - DOSED IN MG ELEMENTAL CALCIUM) 500 MG chewable tablet Chew 1,000 mg by mouth daily as needed for indigestion or heartburn.    . cetirizine (ZYRTEC) 10 MG tablet Take 10 mg by mouth daily as needed for allergies.    . Cholecalciferol (VITAMIN D) 125 MCG (5000 UT) CAPS Take 5,000 Units by mouth daily.     . Cyanocobalamin (VITAMIN B12) 1000 MCG TBCR Take 1,000 mcg by mouth daily.     Marland Kitchen ibuprofen (ADVIL) 200 MG tablet Take 400 mg by mouth every 6 (six) hours as needed for headache or moderate pain.    Marland Kitchen liraglutide (VICTOZA) 18 MG/3ML SOPN Inject 1.8 mg into the skin daily. 27 mL 3  . metoprolol succinate (TOPROL-XL) 25 MG 24 hr tablet Take 1 tablet (25 mg total) by mouth daily. 90 tablet 3  . mexiletine (MEXITIL) 150 MG capsule Take 2 capsules (300 mg total) by mouth 2 (two) times daily. 120 capsule 3  . naproxen sodium (ALEVE)  220 MG tablet Take 220 mg by mouth daily as needed (pain).    . Omega-3 Fatty Acids (FISH OIL) 1000 MG CAPS Take 2,000 mg by mouth daily.    . sacubitril-valsartan (ENTRESTO) 24-26 MG Take 1 tablet by mouth 2 (two) times daily. 60 tablet 5  . simvastatin (ZOCOR) 20 MG tablet Take 1 tablet (20 mg total) by mouth daily at 6 PM. 90 tablet 3  . tadalafil (CIALIS) 5 MG tablet TAKE 1 TABLET BY MOUTH DAILY AS NEEDED FOR ERECTILE DYSFUNCTION 90 tablet 1   Current Facility-Administered Medications on File Prior to Visit  Medication Dose Route Frequency Provider Last Rate Last Admin  . perflutren lipid microspheres (DEFINITY) IV suspension  1-10 mL Intravenous  PRN Pixie Casino, MD   2 mL at 09/27/20 1004    LABS/IMAGING: No results found for this or any previous visit (from the past 48 hour(s)). No results found.  LIPID PANEL:    Component Value Date/Time   CHOL 142 07/19/2020 0905   TRIG 137 07/19/2020 0905   HDL 47 07/19/2020 0905   CHOLHDL 3.0 07/19/2020 0905   VLDL 54.6 (H) 08/02/2019 0919   LDLCALC 73 07/19/2020 0905   LDLDIRECT 69.0 08/02/2019 0919    WEIGHTS: Wt Readings from Last 3 Encounters:  09/27/20 241 lb 9.6 oz (109.6 kg)  08/29/20 241 lb 8 oz (109.5 kg)  08/23/20 239 lb (108.4 kg)    VITALS: BP (!) 128/92   Pulse 64   Ht 5\' 9"  (1.753 m)   Wt 241 lb 9.6 oz (109.6 kg)   BMI 35.68 kg/m   EXAM: General appearance: alert, no distress and mildly obese Neck: no carotid bruit, no JVD and thyroid not enlarged, symmetric, no tenderness/mass/nodules Lungs: clear to auscultation bilaterally Heart: regular rate and rhythm, S1, S2 normal, no murmur, click, rub or gallop Abdomen: soft, non-tender; bowel sounds normal; no masses,  no organomegaly Extremities: extremities normal, atraumatic, no cyanosis or edema Pulses: 2+ and symmetric Skin: Skin color, texture, turgor normal. No rashes or lesions Neurologic: Grossly normal Psych: pleasant  EKG: Sinus rhythm at 66,  RBBB-personally reviewed  ASSESSMENT: 1. 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 Recent reduction in caffeine and alcohol use 4. Family history of coronary disease 5. Type II 2 diabetes -A1c 5.3 6. Hypertension 7. Dyslipidemia  PLAN: 1.   Mr. Hinojos has had suppression of his PVCs it appears with mexiletine. His echo suggests improvement in LV function. I think there is room to further uptitrate his Entresto and will increase it to 49/51 mg twice daily today. The formal echo read should be out later today. We will continue with his current therapies. He sees Dr. Curt Bears back after the holidays. I will plan to see him back otherwise in 3 to 6 months. The big question long-term is will he have to remain on mexiletine indefinitely. With regards to his heart failure medicines, current recommendations suggest he should remain on heart failure therapy indefinitely due to the risk of heart failure recurrence off medications.  Pixie Casino, MD, [facility], [place] Director of the Advanced Lipid Disorders &  Cardiovascular Risk Reduction Clinic Diplomate of the American Board of Clinical Lipidology Attending Cardiologist  Direct Dial: [phone]  Fax: [phone]  Website:  [email or web address] [email or web address] 09/27/2020, 11:59 AM

## 2020-09-27 NOTE — Patient Instructions (Addendum)
Medication Instructions:  INCREASE entresto to 49-51mg  twice daily  *If you need a refill on your cardiac medications before your next appointment, please call your pharmacy*   Lab Work: BMET (non-fasting lab) about 1-2 week after entresto dose increase  If you have labs (blood work) drawn today and your tests are completely normal, you will receive your results only by: Marland Kitchen MyChart Message (if you have MyChart) OR . A paper copy in the mail If you have any lab test that is abnormal or we need to change your treatment, we will call you to review the results.   Follow-Up: At [facility], you and your health needs are our priority.  As part of our continuing mission to provide you with exceptional heart care, we have created designated Provider Care Teams.  These Care Teams include your primary Cardiologist (physician) and Advanced Practice Providers (APPs -  Physician Assistants and Nurse Practitioners) who all work together to provide you with the care you need, when you need it.  We recommend signing up for the patient portal called "MyChart".  Sign up information is provided on this After Visit Summary.  MyChart is used to connect with patients for Virtual Visits (Telemedicine).  Patients are able to view lab/test results, encounter notes, upcoming appointments, etc.  Non-urgent messages can be sent to your provider as well.   To learn more about what you can do with MyChart, go to [email or web address].    Your next appointment:   6 month(s)  The format for your next appointment:   In Person  Provider:   You may see Dr. Debara Pickett or one of the following Advanced Practice Providers on your designated Care Team:    Almyra Deforest, PA-C  Fabian Sharp, [place] or   Roby Lofts, [place]    Other Instructions

## 2020-09-28 ENCOUNTER — Ambulatory Visit (INDEPENDENT_AMBULATORY_CARE_PROVIDER_SITE_OTHER): Payer: 59 | Admitting: Family Medicine

## 2020-09-28 ENCOUNTER — Encounter: Payer: Self-pay | Admitting: Family Medicine

## 2020-09-28 VITALS — BP 132/100 | HR 71 | Temp 97.3°F | Ht 69.0 in | Wt 238.6 lb

## 2020-09-28 DIAGNOSIS — E785 Hyperlipidemia, unspecified: Secondary | ICD-10-CM

## 2020-09-28 DIAGNOSIS — Z23 Encounter for immunization: Secondary | ICD-10-CM

## 2020-09-28 DIAGNOSIS — I5020 Unspecified systolic (congestive) heart failure: Secondary | ICD-10-CM | POA: Diagnosis not present

## 2020-09-28 DIAGNOSIS — I152 Hypertension secondary to endocrine disorders: Secondary | ICD-10-CM | POA: Diagnosis not present

## 2020-09-28 DIAGNOSIS — E119 Type 2 diabetes mellitus without complications: Secondary | ICD-10-CM

## 2020-09-28 DIAGNOSIS — E1169 Type 2 diabetes mellitus with other specified complication: Secondary | ICD-10-CM | POA: Diagnosis not present

## 2020-09-28 DIAGNOSIS — E1159 Type 2 diabetes mellitus with other circulatory complications: Secondary | ICD-10-CM

## 2020-09-28 LAB — POCT GLYCOSYLATED HEMOGLOBIN (HGB A1C): Hemoglobin A1C: 5.3 % (ref 4.0–5.6)

## 2020-09-28 MED ORDER — ROSUVASTATIN CALCIUM 10 MG PO TABS: 10.0000 mg | ORAL_TABLET | Freq: Every day | ORAL | 3 refills | Status: DC

## 2020-09-28 NOTE — Addendum Note (Signed)
Addended by: Doran Clay A on: 09/28/2020 09:04 AM   Modules accepted: Orders

## 2020-09-28 NOTE — Patient Instructions (Signed)
Please return in 6 months to recheck sugars and blood pressure. Please schedule nurse visit in 2-3 months for your 2nd shingrix vaccination.  Start checking your blood pressures at home again.  Today you were given your 1st of 2 Shingrix vaccinations.   If you have any questions or concerns, please don't hesitate to send me a message via MyChart or call the office at [phone]. Thank you for visiting with [place] today! It's our pleasure caring for you.

## 2020-09-28 NOTE — Progress Notes (Signed)
Subjective  CC:  Chief Complaint  Patient presents with  . Diabetes    Does not check blood sugar at home    HPI: Gary Berry is a 49 y.o. male who presents to the office today for follow up of diabetes and problems listed above in the chief complaint.   Diabetes follow up: we stopped metformin 3 months ago. Remains on victoza for possible CHF secondary prevention benefits and weight maintenance and preference. Feels well. No sxs of hyperglycemia. Weight is stable.   CHF: reviewed cardiology note from office visit yesterday.  Titrating up Entresto dose for better cardiac benefits and also for blood pressure control.  Blood pressures running a little bit high, mostly elevated diastolics.  He has not been checking this at home.  Denies palpitations, chest pain or lower extremity edema.  No shortness of breath.  Hyperlipidemia on simvastatin 20 mg with recent LDL 73.  Nonobstructive coronaries on cath.  No adverse effects.  History of obesity: Discussed likely no longer has diabetes.  Not weight has been stable.    Health maintenance: Good candidate for Shingrix.   Wt Readings from Last 3 Encounters:  09/28/20 238 lb 9.6 oz (108.2 kg)  09/27/20 241 lb 9.6 oz (109.6 kg)  08/29/20 241 lb 8 oz (109.5 kg)    BP Readings from Last 3 Encounters:  09/28/20 (!) 132/100  09/27/20 (!) 128/92  08/29/20 127/80    Assessment  1. Type 2 diabetes mellitus without complication, without long-term current use of insulin ([place])   2. Hypertension associated with diabetes ([place])   3. Hyperlipidemia associated with type 2 diabetes mellitus ([place])   4. Systolic congestive heart failure with reduced left ventricular function, NYHA class 2 ([place])      Plan   Diabetes is currently very well controlled.  We discussed trying Aleve and this resolved his metabolic dysfunction.  We will continue Victoza for CHF benefits, weight management and hopeful improvement in cardiac function.  Once CHF stabilizes,  can consider coming off of it.  Patient will consider this.  Hypertension: Elevated today.  Increase Entresto dose.  To start monitoring at home again.  CHF: Improving.  Has close follow-up with cardiology.  Hyperlipidemia: No change to Crestor for LDL goal less than 70.  Shingrix No. 1 given today.  Return in 2 months for second dose  Follow up: 6 months for diabetes, hypertension hyperlipidemia follow-up Orders Placed This Encounter  Procedures  . POCT HgB A1C   Meds ordered this encounter  Medications  . rosuvastatin (CRESTOR) 10 MG tablet    Sig: Take 1 tablet (10 mg total) by mouth daily.    Dispense:  90 tablet    Refill:  3      Immunization History  Administered Date(s) Administered  . Influenza Inj Mdck Quad Pf 08/01/2017  . Influenza, Seasonal, Injecte, Preservative Fre 08/01/2017  . Influenza,inj,Quad PF,6+ Mos 12/06/2015, 11/21/2016, 08/22/2018, 07/19/2020  . Influenza-Unspecified 08/22/2018, 08/01/2019  . PFIZER SARS-COV-2 Vaccination 12/30/2019, 01/20/2020, 08/18/2020  . Pneumococcal Polysaccharide-23 05/25/2017  . Tdap 12/18/2011    Diabetes Related Lab Review: Lab Results  Component Value Date   HGBA1C 5.3 09/28/2020   HGBA1C 5.2 07/19/2020   HGBA1C 5.3 01/12/2020    No results found for: Derl Barrow Lab Results  Component Value Date   CREATININE 0.95 05/24/2020   BUN 13 05/24/2020   NA 143 05/24/2020   K 4.9 05/24/2020   CL 104 05/24/2020   CO2 26 05/24/2020   Lab  Results  Component Value Date   CHOL 142 07/19/2020   CHOL 147 08/02/2019   CHOL 149 02/21/2019   Lab Results  Component Value Date   HDL 47 07/19/2020   HDL 46.10 08/02/2019   HDL 52.10 02/21/2019   Lab Results  Component Value Date   LDLCALC 73 07/19/2020   LDLCALC 68 02/21/2019   LDLCALC 68 11/27/2017   Lab Results  Component Value Date   TRIG 137 07/19/2020   TRIG 273.0 (H) 08/02/2019   TRIG 145.0 02/21/2019   Lab Results  Component Value Date    CHOLHDL 3.0 07/19/2020   CHOLHDL 3 08/02/2019   CHOLHDL 3 02/21/2019   Lab Results  Component Value Date   LDLDIRECT 69.0 08/02/2019   The 10-year ASCVD risk score Mikey Bussing DC Jr., et al., 2013) is: 6%   Values used to calculate the score:     Age: 48 years     Sex: Male     Is Non-Hispanic African American: No     Diabetic: Yes     Tobacco smoker: No     Systolic Blood Pressure: 474 mmHg     Is BP treated: Yes     HDL Cholesterol: 47 mg/dL     Total Cholesterol: 142 mg/dL I have reviewed the PMH, Fam and Soc history. Patient Active Problem List   Diagnosis Date Noted  . Frequent PVCs 05/04/2020  . Systolic congestive heart failure with reduced left ventricular function, NYHA class 2 ([place]) 04/24/2020    Echocardiogram 04/2020:  Ef 35-40%, global hypokinesis   . Type 2 diabetes mellitus without complication, without long-term current use of insulin ([place]) 11/21/2016  . Hypertension associated with diabetes ([place]) 11/21/2016  . Hyperlipidemia associated with type 2 diabetes mellitus ([place]) 11/21/2016  . 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 Obesity, Class II, BMI 35-39.9 11/21/2016  . Umbilical hernia 25/95/6387    Social History: Patient  reports that he has never smoked. He quit smokeless tobacco use about 22 years ago.  His smokeless tobacco use included chew. He reports current alcohol use of about 3.0 standard drinks of alcohol per week. He reports that he does not use drugs.  Review of Systems: Ophthalmic: negative for eye pain, loss of vision or double vision Cardiovascular: negative for chest pain Respiratory: negative for SOB or persistent cough Gastrointestinal: negative for abdominal pain Genitourinary: negative for dysuria or gross hematuria MSK: negative for foot lesions Neurologic: negative for weakness or gait disturbance  Objective  Vitals: BP (!) 132/100   Pulse 71   Temp (!) 97.3 F (36.3 C) (Temporal)   Ht 5\' 9"  (1.753 m)   Wt 238 lb  9.6 oz (108.2 kg)   SpO2 98%   BMI 35.24 kg/m  General: well appearing, no acute distress  Psych:  Alert and oriented, normal mood and affect  Diabetic education: ongoing education regarding chronic disease management for diabetes was given today. We continue to reinforce the ABC's of diabetic management: A1c (<7 or 8 dependent upon patient), tight blood pressure control, and cholesterol management with goal LDL < 100 minimally. We discuss diet strategies, exercise recommendations, medication options and possible side effects. At each visit, we review recommended immunizations and preventive care recommendations for diabetics and stress that good diabetic control can prevent other problems. See below for this patient's data.    Commons side effects, risks, benefits, and alternatives for medications and treatment plan prescribed today were discussed, and the patient expressed understanding of the  given instructions. Patient is instructed to call or message via MyChart if he/she has any questions or concerns regarding our treatment plan. No barriers to understanding were identified. We discussed Red Flag symptoms and signs in detail. Patient expressed understanding regarding what to do in case of urgent or emergency type symptoms.   Medication list was reconciled, printed and provided to the patient in AVS. Patient instructions and summary information was reviewed with the patient as documented in the AVS. This note was prepared with assistance of Dragon voice recognition software. Occasional wrong-word or sound-a-like substitutions may have occurred due to the inherent limitations of voice recognition software  This visit occurred during the SARS-CoV-2 public health emergency.  Safety protocols were in place, including screening questions prior to the visit, additional usage of staff PPE, and extensive cleaning of exam room while observing appropriate contact time as indicated for disinfecting  solutions.

## 2020-10-08 ENCOUNTER — Other Ambulatory Visit: Payer: Self-pay | Admitting: Family Medicine

## 2020-10-08 DIAGNOSIS — N529 Male erectile dysfunction, unspecified: Secondary | ICD-10-CM

## 2020-10-08 NOTE — Telephone Encounter (Signed)
Last refill: 01/12/20 #90, 1 Last OV: 09/28/20 dx. DM

## 2020-10-10 ENCOUNTER — Ambulatory Visit: Payer: 59 | Admitting: Family Medicine

## 2020-10-19 LAB — BASIC METABOLIC PANEL
BUN/Creatinine Ratio: 10 (ref 9–20)
BUN: 14 mg/dL (ref 6–24)
CO2: 26 mmol/L (ref 20–29)
Calcium: 9.7 mg/dL (ref 8.7–10.2)
Chloride: 103 mmol/L (ref 96–106)
Creatinine, Ser: 1.38 mg/dL — ABNORMAL HIGH (ref 0.76–1.27)
GFR calc Af Amer: 68 mL/min/{1.73_m2} (ref >59)
GFR calc non Af Amer: 59 mL/min/{1.73_m2} — ABNORMAL LOW (ref >59)
Glucose: 85 mg/dL (ref 65–99)
Potassium: 5 mmol/L (ref 3.5–5.2)
Sodium: 139 mmol/L (ref 134–144)

## 2020-10-23 ENCOUNTER — Other Ambulatory Visit: Payer: Self-pay | Admitting: *Deleted

## 2020-10-23 DIAGNOSIS — Z79899 Other long term (current) drug therapy: Secondary | ICD-10-CM

## 2020-10-23 DIAGNOSIS — I428 Other cardiomyopathies: Secondary | ICD-10-CM

## 2020-10-24 ENCOUNTER — Other Ambulatory Visit: Payer: Self-pay | Admitting: Family Medicine

## 2020-10-24 DIAGNOSIS — E119 Type 2 diabetes mellitus without complications: Secondary | ICD-10-CM

## 2020-10-29 ENCOUNTER — Other Ambulatory Visit: Payer: Self-pay | Admitting: Family Medicine

## 2020-10-29 DIAGNOSIS — E119 Type 2 diabetes mellitus without complications: Secondary | ICD-10-CM

## 2020-11-16 LAB — BASIC METABOLIC PANEL
BUN/Creatinine Ratio: 17 (ref 9–20)
BUN: 18 mg/dL (ref 6–24)
CO2: 24 mmol/L (ref 20–29)
Calcium: 9.4 mg/dL (ref 8.7–10.2)
Chloride: 104 mmol/L (ref 96–106)
Creatinine, Ser: 1.03 mg/dL (ref 0.76–1.27)
GFR calc Af Amer: 97 mL/min/{1.73_m2} (ref >59)
GFR calc non Af Amer: 84 mL/min/{1.73_m2} (ref >59)
Glucose: 83 mg/dL (ref 65–99)
Potassium: 4.6 mmol/L (ref 3.5–5.2)
Sodium: 141 mmol/L (ref 134–144)

## 2020-11-19 ENCOUNTER — Encounter: Payer: Self-pay | Admitting: *Deleted

## 2020-11-19 ENCOUNTER — Encounter: Payer: Self-pay | Admitting: Cardiology

## 2020-11-19 ENCOUNTER — Ambulatory Visit (INDEPENDENT_AMBULATORY_CARE_PROVIDER_SITE_OTHER): Payer: 59 | Admitting: Cardiology

## 2020-11-19 ENCOUNTER — Other Ambulatory Visit: Payer: Self-pay

## 2020-11-19 ENCOUNTER — Ambulatory Visit (INDEPENDENT_AMBULATORY_CARE_PROVIDER_SITE_OTHER): Payer: 59

## 2020-11-19 VITALS — BP 108/80 | HR 73 | Ht 69.0 in | Wt 247.0 lb

## 2020-11-19 DIAGNOSIS — I493 Ventricular premature depolarization: Secondary | ICD-10-CM

## 2020-11-19 NOTE — Patient Instructions (Addendum)
Medication Instructions:  Your physician recommends that you continue on your current medications as directed. Please refer to the Current Medication list given to you today.  *If you need a refill on your cardiac medications before your next appointment, please call your pharmacy*   Lab Work: None ordered If you have labs (blood work) drawn today and your tests are completely normal, you will receive your results only by: Marland Kitchen MyChart Message (if you have MyChart) OR . A paper copy in the mail If you have any lab test that is abnormal or we need to change your treatment, we will call you to review the results.   Testing/Procedures:                                            Bryn Gulling- Long Term Monitor Instructions   Your physician has requested you wear your ZIO patch monitor for 3 days.   This is a single patch monitor.  [employer] supplies one patch monitor per enrollment.  Additional stickers are not available.   Please do not apply patch if you will be having a Nuclear Stress Test, Echocardiogram, Cardiac CT, MRI, or Chest Xray during the time frame you would be wearing the monitor. The patch cannot be worn during these tests.  You cannot remove and re-apply the ZIO XT patch monitor.   Your ZIO patch monitor will be sent USPS Priority mail from [facility] directly to your home address. The monitor may also be mailed to a PO BOX if home delivery is not available.   It may take 3-5 days to receive your monitor after you have been enrolled.   Once you have received you monitor, please review enclosed instructions.  Your monitor has already been registered assigning a specific monitor serial # to you.   Applying the monitor   Shave hair from upper left chest.   Hold abrader disc by orange tab.  Rub abrader in 40 strokes over left upper chest as indicated in your monitor instructions.   Clean area with 4 enclosed alcohol pads .  Use all pads to assure are is cleaned thoroughly.   Let dry.   Apply patch as indicated in monitor instructions.  Patch will be place under collarbone on left side of chest with arrow pointing upward.   Rub patch adhesive wings for 2 minutes.Remove white label marked "1".  Remove white label marked "2".  Rub patch adhesive wings for 2 additional minutes.   While looking in a mirror, press and release button in center of patch.  A small green light will flash 3-4 times .  This will be your only indicator the monitor has been turned on.     Do not shower for the first 24 hours.  You may shower after the first 24 hours.   Press button if you feel a symptom. You will hear a small click.  Record Date, Time and Symptom in the Patient Log Book.   When you are ready to remove patch, follow instructions on last 2 pages of Patient Log Book.  Stick patch monitor onto last page of Patient Log Book.   Place Patient Log Book in [place] box.  Use locking tab on box and tape box closed securely.  The [employer] has [employer] on it.  Please place in mailbox as soon as possible.  Your physician should have your test results approximately 7 days after the monitor has been mailed back to [facility].   Call [place] at [phone] if you have questions regarding your ZIO XT patch monitor.  Call them immediately if you see an orange light blinking on your monitor.   If your monitor falls off in less than 4 days contact our Monitor department at [phone].  If your monitor becomes loose or falls off after 4 days call [employer] at [phone] for suggestions on securing your monitor.     Follow-Up: At [facility], you and your health needs are our priority.  As part of our continuing mission to provide you with exceptional heart care, we have created designated Provider Care Teams.  These Care Teams include your primary Cardiologist (physician) and Advanced Practice Providers (APPs -  Physician Assistants and Nurse  Practitioners) who all work together to provide you with the care you need, when you need it.  Your next appointment:    to be determined after monitor completed & reviewed  The format for your next appointment:   In Person  Provider:   Allegra Lai, MD    Thank you for choosing [facility]!!    Trinidad Curet, RN [phone]

## 2020-11-19 NOTE — Progress Notes (Signed)
Electrophysiology Office Note   Date:  11/19/2020   ID:  Gary Berry, DOB Dec 12, 1970, MRN [id number]  PCP:  Leamon Arnt, MD  Cardiologist: Debara Pickett Primary Electrophysiologist:  Iowa Kappes Meredith Leeds, MD    Chief Complaint: PVC   History of Present Illness: Gary Berry is a 50 y.o. male who is being seen today for the evaluation of PVC at the request of Leamon Arnt, MD. Presenting today for electrophysiology evaluation.  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  Mild improvement in his PVCs.  He showed a 19% PVC burden.  Today, denies symptoms of palpitations, chest pain, shortness of breath, orthopnea, PND, lower extremity edema, claudication, dizziness, presyncope, syncope, bleeding, or neurologic sequela. The patient is tolerating medications without difficulties.  He continues to have some fatigue and weakness.  He is unaware of his PVCs.  ECG today does show some PVCs, but a less burden.  He had a recent echo that showed mild improvement in his LV function.   Past Medical History:  Diagnosis Date  . Chronic right shoulder pain, requiring injection prn 03/20/2017  . 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 Hyperlipidemia   . Hyperlipidemia associated with type 2 diabetes mellitus ([place]) 11/21/2016  . Hypertension associated with diabetes ([place]) 11/21/2016  . Obesity, Class II, BMI 35-39.9 11/21/2016  . Systolic congestive heart failure with reduced left ventricular function, NYHA class 2 ([place]) 04/24/2020   Echocardiogram 04/2020:  Ef 35-40%, global hypokinesis  . Type 2 diabetes mellitus without complication, without long-term current use of insulin ([place]) 11/21/2016   Past Surgical History:  Procedure Laterality Date  . HERNIA REPAIR  2016   hard to wake up after sx  . RIGHT/LEFT HEART CATH AND CORONARY ANGIOGRAPHY N/A 05/04/2020   Procedure:  RIGHT/LEFT HEART CATH AND CORONARY ANGIOGRAPHY;  Surgeon: Leonie Man, MD;  Location: [place] [facility];  Service: Cardiovascular;  Laterality: N/A;  . TONSILLECTOMY  1975  . VASECTOMY       Current Outpatient Medications  Medication Sig Dispense Refill  . aspirin EC 81 MG tablet Take 81 mg by mouth daily. Swallow whole.    . B-D UF III MINI PEN NEEDLES 31G X 5 MM MISC See admin instructions.    . calcium carbonate (TUMS - DOSED IN MG ELEMENTAL CALCIUM) 500 MG chewable tablet Chew 1,000 mg by mouth daily as needed for indigestion or heartburn.    . cetirizine (ZYRTEC) 10 MG tablet Take 10 mg by mouth daily as needed for allergies.    . Cholecalciferol (VITAMIN D) 125 MCG (5000 UT) CAPS Take 5,000 Units by mouth daily.     . Cyanocobalamin (VITAMIN B12) 1000 MCG TBCR Take 1,000 mcg by mouth daily.     Marland Kitchen ibuprofen (ADVIL) 200 MG tablet Take 400 mg by mouth every 6 (six) hours as needed for headache or moderate pain.    Marland Kitchen liraglutide (VICTOZA) 18 MG/3ML SOPN Inject 1.8 mg into the skin daily. 27 mL 3  . metoprolol succinate (TOPROL-XL) 25 MG 24 hr tablet Take 1 tablet (25 mg total) by mouth daily. 90 tablet 3  . mexiletine (MEXITIL) 150 MG capsule Take 2 capsules (300 mg total) by mouth 2 (two) times daily. 120 capsule 3  . naproxen sodium (ALEVE) 220 MG tablet Take 220 mg by mouth daily as needed (pain).    . Omega-3 Fatty  Acids (FISH OIL) 1000 MG CAPS Take 2,000 mg by mouth daily.    . rosuvastatin (CRESTOR) 10 MG tablet Take 1 tablet (10 mg total) by mouth daily. 90 tablet 3  . sacubitril-valsartan (ENTRESTO) 49-51 MG Take 1 tablet by mouth 2 (two) times daily. 180 tablet 3  . tadalafil (CIALIS) 5 MG tablet TAKE 1 TABLET BY MOUTH DAILY AS NEEDED FOR ERECTILE DYSFUNCTION 90 tablet 1   No current facility-administered medications for this visit.    Allergies:   Patient has no known allergies.   Social History:  The patient  reports that he has never smoked. He quit smokeless tobacco  use about 23 years ago.  His smokeless tobacco use included chew. He reports current alcohol use of about 3.0 standard drinks of alcohol per week. He reports that he does not use drugs.   Family History:  The patient's family history includes Atrial fibrillation in his father; Colon polyps in his father; Diabetes in his mother; Hypertension in his mother.    ROS:  Please see the history of present illness.   Otherwise, review of systems is positive for none.   All other systems are reviewed and negative.    PHYSICAL EXAM: VS:  BP 108/80   Pulse 73   Ht 5\' 9"  (1.753 m)   Wt 247 lb (112 kg)   SpO2 97%   BMI 36.48 kg/m  , BMI Body mass index is 36.48 kg/m. GEN: Well nourished, well developed, in no acute distress  HEENT: normal  Neck: no JVD, carotid bruits, or masses Cardiac: RRR; no murmurs, rubs, or gallops,no edema  Respiratory:  clear to auscultation bilaterally, normal work of breathing GI: soft, nontender, nondistended, + BS MS: no deformity or atrophy  Skin: warm and dry Neuro:  Strength and sensation are intact Psych: euthymic mood, full affect  EKG:  EKG is ordered today. Personal review of the ekg ordered shows sinus rhythm, PVCs  Recent Labs: 03/30/2020: ALT 22; TSH 1.28 05/01/2020: Platelets 248 05/04/2020: Hemoglobin 14.6; Hemoglobin 14.6 11/16/2020: BUN 18; Creatinine, Ser 1.03; Potassium 4.6; Sodium 141    Lipid Panel     Component Value Date/Time   CHOL 142 07/19/2020 0905   TRIG 137 07/19/2020 0905   HDL 47 07/19/2020 0905   CHOLHDL 3.0 07/19/2020 0905   VLDL 54.6 (H) 08/02/2019 0919   LDLCALC 73 07/19/2020 0905   LDLDIRECT 69.0 08/02/2019 0919     Wt Readings from Last 3 Encounters:  11/19/20 247 lb (112 kg)  09/28/20 238 lb 9.6 oz (108.2 kg)  09/27/20 241 lb 9.6 oz (109.6 kg)      Other studies Reviewed: Additional studies/ records that were reviewed today include: TTE 09/27/20 Review of the above records today demonstrates:  1. EF similar to  echo done 04/19/20 diffuse hypokinesis worse in inferior  base . Left ventricular ejection fraction, by estimation, is 40 to 45%.  The left ventricle has mildly decreased function. The left ventricle has  no regional wall motion  abnormalities. The left ventricular internal cavity size was mildly  dilated. Left ventricular diastolic parameters were normal.  2. Right ventricular systolic function is moderately reduced. The right  ventricular size is moderately enlarged.  3. Left atrial size was moderately dilated.  4. The mitral valve is normal in structure. Trivial mitral valve  regurgitation. No evidence of mitral stenosis.  5. The aortic valve is tricuspid. Aortic valve regurgitation is not  visualized. No aortic stenosis is present.  6. The inferior  vena cava is normal in size with greater than 50%  respiratory variability, suggesting right atrial pressure of 3 mmHg.   Cardiac monitor 07/03/2020 personally reviewed Sinus rhythm with frequent PVC's at 14% burden.   ASSESSMENT AND PLAN:  1.  PVCs: Fortunately normal coronary arteries by most recent catheterization.  Ejection fraction is low and could be due to his PVC burden.  PVCs at 14%.  Currently on mexiletine.  High risk medication monitoring.  He does appear to have a reduced burden of PVCs.  We Arius Harnois get a 3-day monitor to further determine his burden.  If his PVCs are significantly reduced, we Cruzita Lipa continue with mexiletine.  If he does continue to have PVCs at high burden, Ravneet Spilker likely plan for ablation.  2.  Nonischemic cardiomyopathy: Potentially PVC induced.  Currently on Toprol-XL and Entresto per primary cardiology.    3.  Hypertension: Currently well controlled  Current medicines are reviewed at length with the patient today.   The patient does not have concerns regarding his medicines.  The following changes were made today: None  Labs/ tests ordered today include:  Orders Placed This Encounter  Procedures  . LONG  TERM MONITOR (3-14 DAYS)  . EKG 12-Lead     Disposition:   FU with Jeanpaul Biehl 3 months  Signed, Deserae Jennings Meredith Leeds, MD  11/19/2020 10:16 AM     [facility] [address] [place] [address] [phone] (office) [phone] (fax)

## 2020-11-19 NOTE — Progress Notes (Signed)
Patient ID: Gary Berry, male   DOB: 1971/09/14, 50 y.o.   MRN: [id number] Patient enrolled for [employer] to ship a 3 day ZIO XT long term holter monitor to his home.

## 2020-11-26 ENCOUNTER — Other Ambulatory Visit: Payer: Self-pay | Admitting: Cardiology

## 2020-12-27 ENCOUNTER — Encounter: Payer: Self-pay | Admitting: *Deleted

## 2020-12-27 ENCOUNTER — Other Ambulatory Visit: Payer: Self-pay

## 2020-12-27 ENCOUNTER — Ambulatory Visit (INDEPENDENT_AMBULATORY_CARE_PROVIDER_SITE_OTHER): Payer: 59 | Admitting: *Deleted

## 2020-12-27 DIAGNOSIS — Z23 Encounter for immunization: Secondary | ICD-10-CM | POA: Diagnosis not present

## 2020-12-27 NOTE — Progress Notes (Signed)
Per orders of Dr. Jonni Sanger , injection of Shingrix 0.5 ml given IM by Anselmo Pickler, LPN in right deltoid. Patient tolerated injection well. Pt has completed the series

## 2020-12-31 NOTE — Telephone Encounter (Signed)
Pt agreeable to 4/28 for rescheduling ablation Aware I will send instructions via mychart. Patient verbalized understanding and agreeable to plan.

## 2021-01-24 DIAGNOSIS — Z01812 Encounter for preprocedural laboratory examination: Secondary | ICD-10-CM

## 2021-01-24 DIAGNOSIS — I493 Ventricular premature depolarization: Secondary | ICD-10-CM

## 2021-02-12 ENCOUNTER — Other Ambulatory Visit: Payer: 59 | Admitting: *Deleted

## 2021-02-12 ENCOUNTER — Other Ambulatory Visit: Payer: Self-pay

## 2021-02-12 ENCOUNTER — Other Ambulatory Visit (HOSPITAL_COMMUNITY)
Admission: RE | Admit: 2021-02-12 | Discharge: 2021-02-12 | Disposition: A | Discharge status: Home / Self Care | Payer: 59 | Source: Ambulatory Visit | Attending: Cardiology | Admitting: Cardiology

## 2021-02-12 DIAGNOSIS — Z20822 Contact with and (suspected) exposure to covid-19: Secondary | ICD-10-CM | POA: Insufficient documentation

## 2021-02-12 DIAGNOSIS — Z01812 Encounter for preprocedural laboratory examination: Secondary | ICD-10-CM | POA: Diagnosis not present

## 2021-02-12 DIAGNOSIS — I493 Ventricular premature depolarization: Secondary | ICD-10-CM

## 2021-02-12 LAB — CBC
Hematocrit: 46 % (ref 37.5–51.0)
Hemoglobin: 15.3 g/dL (ref 13.0–17.7)
MCH: 30.9 pg (ref 26.6–33.0)
MCHC: 33.3 g/dL (ref 31.5–35.7)
MCV: 93 fL (ref 79–97)
Platelets: 237 10*3/uL (ref 150–450)
RBC: 4.95 x10E6/uL (ref 4.14–5.80)
RDW: 12.9 % (ref 11.6–15.4)
WBC: 6.7 10*3/uL (ref 3.4–10.8)

## 2021-02-12 LAB — BASIC METABOLIC PANEL
BUN/Creatinine Ratio: 16 (ref 9–20)
BUN: 16 mg/dL (ref 6–24)
CO2: 24 mmol/L (ref 20–29)
Calcium: 9.4 mg/dL (ref 8.7–10.2)
Chloride: 101 mmol/L (ref 96–106)
Creatinine, Ser: 0.97 mg/dL (ref 0.76–1.27)
Glucose: 100 mg/dL — ABNORMAL HIGH (ref 65–99)
Potassium: 4.3 mmol/L (ref 3.5–5.2)
Sodium: 138 mmol/L (ref 134–144)
eGFR: 94 mL/min/{1.73_m2} (ref >59)

## 2021-02-13 LAB — SARS CORONAVIRUS 2 (TAT 6-24 HRS): SARS Coronavirus 2: NEGATIVE

## 2021-02-13 NOTE — Pre-Procedure Instructions (Signed)
Instructed patient on the following items: °Arrival time 0530 °Nothing to eat or drink after midnight °No meds AM of procedure °Responsible person to drive you home and stay with you for 24 hrs ° ° °   °

## 2021-02-14 ENCOUNTER — Encounter (HOSPITAL_COMMUNITY): Payer: Self-pay | Admitting: Cardiology

## 2021-02-14 ENCOUNTER — Other Ambulatory Visit: Payer: Self-pay

## 2021-02-14 ENCOUNTER — Ambulatory Visit (HOSPITAL_COMMUNITY): Payer: 59 | Admitting: Certified Registered"

## 2021-02-14 ENCOUNTER — Encounter (HOSPITAL_COMMUNITY)
Admission: RE | Disposition: A | Discharge status: Home / Self Care | Payer: Self-pay | Source: Home / Self Care | Attending: Cardiology

## 2021-02-14 ENCOUNTER — Observation Stay (HOSPITAL_COMMUNITY)
Admission: RE | Admit: 2021-02-14 | Discharge: 2021-02-15 | Disposition: A | Discharge status: Home / Self Care | Payer: 59 | Attending: Cardiology | Admitting: Cardiology

## 2021-02-14 DIAGNOSIS — Z87891 Personal history of nicotine dependence: Secondary | ICD-10-CM | POA: Insufficient documentation

## 2021-02-14 DIAGNOSIS — I493 Ventricular premature depolarization: Secondary | ICD-10-CM

## 2021-02-14 DIAGNOSIS — I5022 Chronic systolic (congestive) heart failure: Secondary | ICD-10-CM | POA: Diagnosis not present

## 2021-02-14 DIAGNOSIS — I110 Hypertensive heart disease with heart failure: Secondary | ICD-10-CM | POA: Insufficient documentation

## 2021-02-14 DIAGNOSIS — E119 Type 2 diabetes mellitus without complications: Secondary | ICD-10-CM | POA: Insufficient documentation

## 2021-02-14 HISTORY — PX: PVC ABLATION: EP1236

## 2021-02-14 LAB — GLUCOSE, CAPILLARY
Glucose-Capillary: 129 mg/dL — ABNORMAL HIGH (ref 70–99)
Glucose-Capillary: 116 mg/dL — ABNORMAL HIGH (ref 70–99)
Glucose-Capillary: 115 mg/dL — ABNORMAL HIGH (ref 70–99)
Glucose-Capillary: 120 mg/dL — ABNORMAL HIGH (ref 70–99)
Glucose-Capillary: 100 mg/dL — ABNORMAL HIGH (ref 70–99)

## 2021-02-14 SURGERY — PVC ABLATION
Anesthesia: General

## 2021-02-14 MED ORDER — BUPIVACAINE HCL (PF) 0.25 % IJ SOLN
INTRAMUSCULAR | Status: DC | PRN
  Administered 2021-02-14: 30 mL

## 2021-02-14 MED ORDER — HEPARIN SODIUM (PORCINE) 1000 UNIT/ML IJ SOLN
INTRAMUSCULAR | Status: DC | PRN
  Administered 2021-02-14: 1000 [IU] via INTRAVENOUS

## 2021-02-14 MED ORDER — CALCIUM CARBONATE ANTACID 500 MG PO CHEW: 1000.0000 mg | CHEWABLE_TABLET | Freq: Every day | ORAL | Status: DC | PRN

## 2021-02-14 MED ORDER — LIRAGLUTIDE 18 MG/3ML ~~LOC~~ SOPN: 1.8000 mg | PEN_INJECTOR | SUBCUTANEOUS | Status: DC

## 2021-02-14 MED ORDER — PROTAMINE SULFATE 10 MG/ML IV SOLN
INTRAVENOUS | Status: DC | PRN
  Administered 2021-02-14: 40 mg via INTRAVENOUS

## 2021-02-14 MED ORDER — FENTANYL CITRATE (PF) 100 MCG/2ML IJ SOLN
25.0000 ug | Freq: Once | INTRAMUSCULAR | Status: AC
Start: 2021-02-14 — End: 2021-02-14
  Administered 2021-02-14: 25 ug via INTRAVENOUS

## 2021-02-14 MED ORDER — ROSUVASTATIN CALCIUM 5 MG PO TABS
10.0000 mg | ORAL_TABLET | Freq: Every day | ORAL | Status: DC
  Administered 2021-02-14: 10 mg via ORAL
  Filled 2021-02-14: qty 2

## 2021-02-14 MED ORDER — LACTATED RINGERS IV SOLN: INTRAVENOUS | Status: DC | PRN

## 2021-02-14 MED ORDER — MIDAZOLAM HCL 5 MG/5ML IJ SOLN
INTRAMUSCULAR | Status: DC | PRN
  Administered 2021-02-14 (×2): 1 mg via INTRAVENOUS

## 2021-02-14 MED ORDER — HEPARIN SODIUM (PORCINE) 1000 UNIT/ML IJ SOLN
INTRAMUSCULAR | Status: DC | PRN
  Administered 2021-02-14: 3000 [IU] via INTRAVENOUS
  Administered 2021-02-14: 10000 [IU] via INTRAVENOUS

## 2021-02-14 MED ORDER — HEPARIN (PORCINE) IN NACL 1000-0.9 UT/500ML-% IV SOLN
INTRAVENOUS | Status: AC
  Filled 2021-02-14: qty 1500

## 2021-02-14 MED ORDER — SOTALOL HCL 80 MG PO TABS
80.0000 mg | ORAL_TABLET | Freq: Two times a day (BID) | ORAL | Status: DC
  Administered 2021-02-14: 80 mg via ORAL
  Filled 2021-02-14: qty 1

## 2021-02-14 MED ORDER — ACETAMINOPHEN 325 MG PO TABS: 650.0000 mg | ORAL_TABLET | ORAL | Status: DC | PRN

## 2021-02-14 MED ORDER — METOPROLOL SUCCINATE ER 25 MG PO TB24: 25.0000 mg | ORAL_TABLET | Freq: Every day | ORAL | Status: DC

## 2021-02-14 MED ORDER — SODIUM CHLORIDE 0.9 % IV SOLN: INTRAVENOUS | Status: DC

## 2021-02-14 MED ORDER — HEPARIN (PORCINE) IN NACL 1000-0.9 UT/500ML-% IV SOLN
INTRAVENOUS | Status: DC | PRN
  Administered 2021-02-14 (×3): 500 mL

## 2021-02-14 MED ORDER — NAPROXEN SODIUM 220 MG PO TABS: 220.0000 mg | ORAL_TABLET | Freq: Every day | ORAL | Status: DC | PRN

## 2021-02-14 MED ORDER — SODIUM CHLORIDE 0.9% FLUSH
3.0000 mL | Freq: Two times a day (BID) | INTRAVENOUS | Status: DC
  Administered 2021-02-14: 3 mL via INTRAVENOUS

## 2021-02-14 MED ORDER — PROPOFOL 500 MG/50ML IV EMUL
INTRAVENOUS | Status: DC | PRN
  Administered 2021-02-14: 50 ug/kg/min via INTRAVENOUS

## 2021-02-14 MED ORDER — ASPIRIN EC 81 MG PO TBEC
81.0000 mg | DELAYED_RELEASE_TABLET | Freq: Every day | ORAL | Status: DC
  Administered 2021-02-14 – 2021-02-15 (×2): 81 mg via ORAL
  Filled 2021-02-14 (×2): qty 1

## 2021-02-14 MED ORDER — FENTANYL CITRATE (PF) 100 MCG/2ML IJ SOLN
INTRAMUSCULAR | Status: AC
  Filled 2021-02-14: qty 2

## 2021-02-14 MED ORDER — VITAMIN D 25 MCG (1000 UNIT) PO TABS
5000.0000 [IU] | ORAL_TABLET | Freq: Every day | ORAL | Status: DC
  Administered 2021-02-14 – 2021-02-15 (×2): 5000 [IU] via ORAL
  Filled 2021-02-14 (×2): qty 5

## 2021-02-14 MED ORDER — VITAMIN B-12 1000 MCG PO TABS
1000.0000 ug | ORAL_TABLET | Freq: Every day | ORAL | Status: DC
  Administered 2021-02-14 – 2021-02-15 (×2): 1000 ug via ORAL
  Filled 2021-02-14 (×2): qty 1

## 2021-02-14 MED ORDER — SODIUM CHLORIDE 0.9% FLUSH: 3.0000 mL | INTRAVENOUS | Status: DC | PRN

## 2021-02-14 MED ORDER — ONDANSETRON HCL 4 MG/2ML IJ SOLN: 4.0000 mg | Freq: Four times a day (QID) | INTRAMUSCULAR | Status: DC | PRN

## 2021-02-14 MED ORDER — IBUPROFEN 200 MG PO TABS
400.0000 mg | ORAL_TABLET | Freq: Four times a day (QID) | ORAL | Status: DC | PRN
  Filled 2021-02-14: qty 2

## 2021-02-14 MED ORDER — LORATADINE 10 MG PO TABS
10.0000 mg | ORAL_TABLET | Freq: Every day | ORAL | Status: DC
  Administered 2021-02-15: 10 mg via ORAL
  Filled 2021-02-14: qty 1

## 2021-02-14 MED ORDER — SODIUM CHLORIDE 0.9 % IV SOLN: 250.0000 mL | INTRAVENOUS | Status: DC | PRN

## 2021-02-14 MED ORDER — SACUBITRIL-VALSARTAN 49-51 MG PO TABS
1.0000 | ORAL_TABLET | Freq: Two times a day (BID) | ORAL | Status: DC
  Administered 2021-02-14 – 2021-02-15 (×3): 1 via ORAL
  Filled 2021-02-14 (×3): qty 1

## 2021-02-14 MED ORDER — HEPARIN SODIUM (PORCINE) 1000 UNIT/ML IJ SOLN
INTRAMUSCULAR | Status: AC
  Filled 2021-02-14: qty 1

## 2021-02-14 SURGICAL SUPPLY — 15 items
BAG SNAP BAND KOVER 36X36 (MISCELLANEOUS) ×2 IMPLANT
CATH JOSEPH QUAD ALLRED 6F REP (CATHETERS) ×2 IMPLANT
CATH SMTCH THERMOCOOL SF DF (CATHETERS) ×2 IMPLANT
CATH SOUNDSTAR ECO 8FR (CATHETERS) ×2 IMPLANT
CLOSURE PERCLOSE PROSTYLE (VASCULAR PRODUCTS) ×2 IMPLANT
COVER SWIFTLINK CONNECTOR (BAG) ×2 IMPLANT
PACK EP LATEX FREE (CUSTOM PROCEDURE TRAY) ×1
PACK EP LF (CUSTOM PROCEDURE TRAY) ×1 IMPLANT
PAD PRO RADIOLUCENT 2001M-C (PAD) ×2 IMPLANT
PATCH CARTO3 (PAD) ×2 IMPLANT
SHEATH PINNACLE 6F 10CM (SHEATH) ×2 IMPLANT
SHEATH PINNACLE 8F 10CM (SHEATH) ×4 IMPLANT
SHEATH PINNACLE 9F 10CM (SHEATH) ×2 IMPLANT
SHEATH PROBE COVER 6X72 (BAG) ×2 IMPLANT
TUBING SMART ABLATE COOLFLOW (TUBING) ×2 IMPLANT

## 2021-02-14 NOTE — Progress Notes (Signed)
Site area: Right groin a 6, 7, 8 french venous sheaths were removed  Site Prior to Removal:  Level 0  Pressure Applied For 20 MINUTES    Bedrest Beginning at 1015am X 4 hours  Manual:   Yes.    Patient Status During Pull:  stable  Post Pull Groin Site:  Level 0  Post Pull Instructions Given:  Yes.    Post Pull Pulses Present:  Yes.    Dressing Applied:  Yes.    Comments:

## 2021-02-14 NOTE — Discharge Summary (Addendum)
ELECTROPHYSIOLOGY PROCEDURE DISCHARGE SUMMARY    Patient ID: Gary Berry,  MRN: [id number], DOB/AGE: 1971/05/08 50 y.o.  Admit date: 02/14/2021 Discharge date: 02/15/21  Primary Care Physician: Leamon Arnt, MD  Primary Cardiologist: Dr. Debara Pickett Electrophysiologist: Dr. Curt Bears  Primary Discharge Diagnosis:  1. PVCs  Secondary Discharge Diagnosis:  1. NICM 2. Chronic CHF (systolic) 3. RBBB 4. HTN 5. HLD 5. DM  Procedures This Admission:  1.  Electrophysiology study and radiofrequency catheter ablation on by Dr Curt Bears This study demonstrated  CONCLUSIONS:  1. Sinus rhythm upon presentation.  2. The patient had PVCs 3. Successful radiofrequency modification of the PVCs in the right coronary cusp 4. No early apparent complications.     Brief HPI: Gary Berry is a 50 y.o. male with a history of NICM felt 2/2 PVCs.  Maintained initially on mexiletine though with increasing PVCs burden despite AAD recommended changing AAD vs ablation.  Risks, benefits, and alternatives to catheter ablation of PVCs were reviewed with the patient who wished to proceed.     Hospital Course:  The patient was admitted and underwent EPS/RFCA with details as outlined above.   Unfortunately post procedure he had return of frequent PVCs and was decided to start him on sotalol. Initially 80mg  last night, 120mg  this morning with stable QTc post dose He was monitored on telemetry overnight which demonstrated SR initially with frequent PVCs though with sotalol a clear reduction in his PVCs burden to hours stretches without any.  R Groin was without complication on the day of discharge.  The patient feels well today, denies CP, SOB, site discomfort.  He was examined by Dr. Curt Bears and considered to be stable for discharge.  Wound care and restrictions were reviewed with the patient.  The patient [name] be seen back for EKG 5/3 and by Dr Curt Bears  in 2 weeks for post ablation follow up.    Physical  Exam: Vitals:   02/15/21 0000 02/15/21 0500 02/15/21 0811 02/15/21 1100  BP: 122/69 112/67 120/73 123/78  Pulse: 69 61 67 60  Resp: 18 18    Temp:  98.2 F (36.8 C) 98.7 F (37.1 C)   TempSrc:  Oral Oral   SpO2: 97% 97% 97%   Weight:  106.5 kg    Height:        GEN- The patient is well appearing, alert and oriented x 3 today.   HEENT: normocephalic, atraumatic; sclera clear, conjunctiva pink; hearing intact; oropharynx clear; neck supple  Lungs- CTA b/l, normal work of breathing.  No wheezes, rales, rhonchi Heart- RRR, no murmurs, rubs or gallops  GI- soft, non-tender, non-distended, bowel sounds present  Extremities- no clubbing, cyanosis, or edema; DP/PT/radial pulses 2+ bilaterally, R groin without hematoma/bruit MS- no significant deformity or atrophy Skin- warm and dry, no rash or lesion Psych- euthymic mood, full affect Neuro- strength and sensation are intact   Labs:   Lab Results  Component Value Date   WBC 6.7 02/12/2021   HGB 15.3 02/12/2021   HCT 46.0 02/12/2021   MCV 93 02/12/2021   PLT 237 02/12/2021    Recent Labs  Lab 02/15/21 0400  NA 138  K 3.8  CL 104  CO2 25  BUN 12  CREATININE 0.92  CALCIUM 9.2  GLUCOSE 102*     Discharge Medications:  Allergies as of 02/15/2021   No Known Allergies     Medication List    STOP taking these medications   metoprolol succinate 25 MG 24 hr  tablet Commonly known as: TOPROL-XL   mexiletine 150 MG capsule Commonly known as: MEXITIL     TAKE these medications   aspirin EC 81 MG tablet Take 81 mg by mouth daily. Swallow whole.   B-D UF III MINI PEN NEEDLES 31G X 5 MM Misc Generic drug: Insulin Pen Needle See admin instructions.   calcium carbonate 500 MG chewable tablet Commonly known as: TUMS - dosed in mg elemental calcium Chew 1,000 mg by mouth daily as needed for indigestion or heartburn.   cetirizine 10 MG tablet Commonly known as: ZYRTEC Take 10 mg by mouth daily as needed for  allergies.   Fish Oil 1000 MG Caps Take 2,000 mg by mouth daily.   ibuprofen 200 MG tablet Commonly known as: ADVIL Take 400 mg by mouth every 6 (six) hours as needed for headache or moderate pain.   liraglutide 18 MG/3ML Sopn Commonly known as: VICTOZA Inject 1.8 mg into the skin every morning.   naproxen sodium 220 MG tablet Commonly known as: ALEVE Take 220 mg by mouth daily as needed (pain).   rosuvastatin 10 MG tablet Commonly known as: CRESTOR Take 1 tablet (10 mg total) by mouth daily.   sacubitril-valsartan 49-51 MG Commonly known as: ENTRESTO Take 1 tablet by mouth 2 (two) times daily.   sotalol 120 MG tablet Commonly known as: BETAPACE Take 1 tablet (120 mg total) by mouth every 12 (twelve) hours.   tadalafil 5 MG tablet Commonly known as: CIALIS TAKE 1 TABLET BY MOUTH DAILY AS NEEDED FOR ERECTILE DYSFUNCTION What changed: See the new instructions.   Vitamin B12 1000 MCG Tbcr Take 1,000 mcg by mouth daily.   Vitamin D 125 MCG (5000 UT) Caps Take 5,000 Units by mouth daily.       Disposition: Home Discharge Instructions    Diet - low sodium heart healthy   Complete by: As directed    Increase activity slowly   Complete by: As directed       Follow-up Information    Constance Haw, MD Follow up.   Specialty: Cardiology Why: 03/05/21 @ 10:45AM Contact information: [address] [place] [address] [phone]        [facility] Follow up.   Specialty: Cardiology Why: 02/19/21 @ 11:00AM for EKG Contact information: [address] [place] [phone]              Duration of Discharge Encounter: Greater than 30 minutes including physician time.  Signed, Tommye Standard, PA-C 02/15/2021 2:18 PM   I have seen and examined this patient with Renee RRR, no murmurs.   Agree with above, note added to reflect my findings.  On exam, patient status post PVC  ablation yesterday.  Unfortunately PVCs were coming from the right coronary cusp very close to the right coronary artery.  He continued to have PVCs yesterday and overnight.  He was started on sotalol yesterday.  QTC has remained stable.  We Raeven Pint continue with sotalol.  Plan for ECG next Tuesday..    Leanard Dimaio M. Lydiann Bonifas MD 02/15/2021 3:07 PM

## 2021-02-14 NOTE — Progress Notes (Signed)
OK to start sotalol as ordered today by Dr. Curt Bears tonight without new labs, EKG.  Tommye Standard, PA-C

## 2021-02-14 NOTE — Anesthesia Preprocedure Evaluation (Signed)
Anesthesia Evaluation  Patient identified by MRN, date of birth, ID band Patient awake    Reviewed: Allergy & Precautions, NPO status , Patient's Chart, lab work & pertinent test results  History of Anesthesia Complications Negative for: history of anesthetic complications  Airway Mallampati: I  TM Distance: >3 FB Neck ROM: Full    Dental  (+) Dental Advisory Given, Teeth Intact   Pulmonary neg pulmonary ROS,  Covid-19 Nucleic Acid Test Results Lab Results      Component                Value               Date                      SARSCOV2NAA              NEGATIVE            02/12/2021              breath sounds clear to auscultation       Cardiovascular hypertension, Pt. on medications and Pt. on home beta blockers (-) angina+CHF  + dysrhythmias  Rhythm:Regular  1. EF similar to echo done 04/19/20 diffuse hypokinesis worse in inferior  base . Left ventricular ejection fraction, by estimation, is 40 to 45%.  The left ventricle has mildly decreased function. The left ventricle has  no regional wall motion  abnormalities. The left ventricular internal cavity size was mildly  dilated. Left ventricular diastolic parameters were normal.  2. Right ventricular systolic function is moderately reduced. The right  ventricular size is moderately enlarged.  3. Left atrial size was moderately dilated.  4. The mitral valve is normal in structure. Trivial mitral valve  regurgitation. No evidence of mitral stenosis.  5. The aortic valve is tricuspid. Aortic valve regurgitation is not  visualized. No aortic stenosis is present.  6. The inferior vena cava is normal in size with greater than 50%  respiratory variability, suggesting right atrial pressure of 3 mmHg.    Neuro/Psych negative neurological ROS  negative psych ROS   GI/Hepatic negative GI ROS, Neg liver ROS,   Endo/Other  diabetes  Renal/GU negative Renal ROSLab Results       Component                Value               Date                      CREATININE               0.97                02/12/2021                Musculoskeletal negative musculoskeletal ROS (+)   Abdominal   Peds  Hematology negative hematology ROS (+) Lab Results      Component                Value               Date                      WBC                      6.7  02/12/2021                HGB                      15.3                02/12/2021                HCT                      46.0                02/12/2021                MCV                      93                  02/12/2021                PLT                      237                 02/12/2021              Anesthesia Other Findings   Reproductive/Obstetrics                             Anesthesia Physical Anesthesia Plan  ASA: II  Anesthesia Plan: MAC   Post-op Pain Management:    Induction: Intravenous  PONV Risk Score and Plan: 1 and Propofol infusion and Treatment may vary due to age or medical condition  Airway Management Planned: Nasal Cannula  Additional Equipment: None  Intra-op Plan:   Post-operative Plan:   Informed Consent: I have reviewed the patients History and Physical, chart, labs and discussed the procedure including the risks, benefits and alternatives for the proposed anesthesia with the patient or authorized representative who has indicated his/her understanding and acceptance.     Dental advisory given  Plan Discussed with: CRNA  Anesthesia Plan Comments:         Anesthesia Quick Evaluation

## 2021-02-14 NOTE — H&P (Signed)
Electrophysiology Office Note   Date:  02/14/2021   ID:  Gary Berry, DOB 05-20-1971, MRN [id number]  PCP:  Leamon Arnt, MD  Cardiologist: Debara Pickett Primary Electrophysiologist:  Stephanie Mcglone Meredith Leeds, MD    Chief Complaint: PVC   History of Present Illness: Gary Berry is a 50 y.o. male who is being seen today for the evaluation of PVC at the request of No ref. provider found. Presenting today for electrophysiology evaluation.  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  Mild improvement in his PVCs.  He showed a 19% PVC burden.  Today, denies symptoms of palpitations, chest pain, shortness of breath, orthopnea, PND, lower extremity edema, claudication, dizziness, presyncope, syncope, bleeding, or neurologic sequela. The patient is tolerating medications without difficulties. Plan for PVC ablation today.   Past Medical History:  Diagnosis Date  . Chronic right shoulder pain, requiring injection prn 03/20/2017  . 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 Hyperlipidemia   . Hyperlipidemia associated with type 2 diabetes mellitus ([place]) 11/21/2016  . Hypertension associated with diabetes ([place]) 11/21/2016  . Obesity, Class II, BMI 35-39.9 11/21/2016  . Systolic congestive heart failure with reduced left ventricular function, NYHA class 2 ([place]) 04/24/2020   Echocardiogram 04/2020:  Ef 35-40%, global hypokinesis  . Type 2 diabetes mellitus without complication, without long-term current use of insulin ([place]) 11/21/2016   Past Surgical History:  Procedure Laterality Date  . HERNIA REPAIR  2016   hard to wake up after sx  . RIGHT/LEFT HEART CATH AND CORONARY ANGIOGRAPHY N/A 05/04/2020   Procedure: RIGHT/LEFT HEART CATH AND CORONARY ANGIOGRAPHY;  Surgeon: Leonie Man, MD;  Location: [facility];  Service: Cardiovascular;  Laterality: N/A;  . TONSILLECTOMY   1975  . VASECTOMY       Current Facility-Administered Medications  Medication Dose Route Frequency Provider Last Rate Last Admin  . 0.9 %  sodium chloride infusion   Intravenous Continuous Constance Haw, MD 50 mL/hr at 02/14/21 6295 New Bag at 02/14/21 2841    Allergies:   Patient has no known allergies.   Social History:  The patient  reports that he has never smoked. He quit smokeless tobacco use about 23 years ago.  His smokeless tobacco use included chew. He reports current alcohol use of about 3.0 standard drinks of alcohol per week. He reports that he does not use drugs.   Family History:  The patient's family history includes Atrial fibrillation in his father; Colon polyps in his father; Diabetes in his mother; Hypertension in his mother.   ROS:  Please see the history of present illness.   Otherwise, review of systems is positive for none.   All other systems are reviewed and negative.   PHYSICAL EXAM: VS:  BP (!) 130/92   Pulse 70   Temp 97.9 F (36.6 C) (Oral)   Resp 18   Ht 5\' 9"  (1.753 m)   Wt 108.9 kg   SpO2 100%   BMI 35.44 kg/m  , BMI Body mass index is 35.44 kg/m. GEN: Well nourished, well developed, in no acute distress  HEENT: normal  Neck: no JVD, carotid bruits, or masses Cardiac: RRR; no murmurs, rubs, or gallops,no edema  Respiratory:  clear to auscultation bilaterally, normal work of breathing GI: soft, nontender, nondistended, + BS MS: no deformity or atrophy  Skin: warm and dry Neuro:  Strength and sensation are intact Psych: euthymic mood, full affect  Recent Labs: 03/30/2020: ALT 22; TSH 1.28 02/12/2021: BUN 16; Creatinine, Ser 0.97; Hemoglobin 15.3; Platelets 237; Potassium 4.3; Sodium 138    Lipid Panel     Component Value Date/Time   CHOL 142 07/19/2020 0905   TRIG 137 07/19/2020 0905   HDL 47 07/19/2020 0905   CHOLHDL 3.0 07/19/2020 0905   VLDL 54.6 (H) 08/02/2019 0919   LDLCALC 73 07/19/2020 0905   LDLDIRECT 69.0 08/02/2019  0919     Wt Readings from Last 3 Encounters:  02/14/21 108.9 kg  11/19/20 112 kg  09/28/20 108.2 kg      Other studies Reviewed: Additional studies/ records that were reviewed today include: TTE 09/27/20 Review of the above records today demonstrates:  1. EF similar to echo done 04/19/20 diffuse hypokinesis worse in inferior  base . Left ventricular ejection fraction, by estimation, is 40 to 45%.  The left ventricle has mildly decreased function. The left ventricle has  no regional wall motion  abnormalities. The left ventricular internal cavity size was mildly  dilated. Left ventricular diastolic parameters were normal.  2. Right ventricular systolic function is moderately reduced. The right  ventricular size is moderately enlarged.  3. Left atrial size was moderately dilated.  4. The mitral valve is normal in structure. Trivial mitral valve  regurgitation. No evidence of mitral stenosis.  5. The aortic valve is tricuspid. Aortic valve regurgitation is not  visualized. No aortic stenosis is present.  6. The inferior vena cava is normal in size with greater than 50%  respiratory variability, suggesting right atrial pressure of 3 mmHg.   Cardiac monitor 07/03/2020 personally reviewed Sinus rhythm with frequent PVC's at 14% burden.   ASSESSMENT AND PLAN:  1.  PVCs: Gary Berry has presented today for surgery, with the diagnosis of PVC.  The various methods of treatment have been discussed with the patient and family. After consideration of risks, benefits and other options for treatment, the patient has consented to  Procedure(s): Catheter ablation as a surgical intervention .  Risks include but not limited to complete heart block, stroke, esophageal damage, nerve damage, bleeding, vascular damage, tamponade, perforation, MI, and death. The patient's history has been reviewed, patient examined, no change in status, stable for surgery.  I have reviewed the patient's chart and  labs.  Questions were answered to the patient's satisfaction.    Emilygrace Grothe Curt Bears, MD 02/14/2021 7:00 AM

## 2021-02-14 NOTE — Discharge Instructions (Signed)
Post procedure care instructions No driving for 4 days. No lifting over 5 lbs for 1 week. No vigorous or sexual activity for 1 week. You may return to work/your usual activities on 02/22/21. Keep procedure site clean & dry. If you notice increased pain, swelling, bleeding or pus, call/return!  You may shower after 24 hours, but no soaking in baths/hot tubs/pools for 1 week.

## 2021-02-14 NOTE — Transfer of Care (Signed)
Immediate Anesthesia Transfer of Care Note  Patient: Gary Berry  Procedure(s) Performed: PVC ABLATION (N/A )  Patient Location: Cath Lab  Anesthesia Type:MAC  Level of Consciousness: awake, alert  and oriented  Airway & Oxygen Therapy: Patient Spontanous Breathing and Patient connected to nasal cannula oxygen  Post-op Assessment: Report given to RN, Post -op Vital signs reviewed and stable and Patient moving all extremities X 4  Post vital signs: Reviewed and stable  Last Vitals:  Vitals Value Taken Time  BP 126/80 02/14/21 0934  Temp    Pulse 34 02/14/21 0937  Resp 12 02/14/21 0937  SpO2 100 % 02/14/21 0937  Vitals shown include unvalidated device data.  Last Pain:  Vitals:   02/14/21 0629  TempSrc:   PainSc: 0-No pain         Complications: No complications documented.

## 2021-02-15 DIAGNOSIS — I493 Ventricular premature depolarization: Secondary | ICD-10-CM | POA: Diagnosis not present

## 2021-02-15 DIAGNOSIS — I110 Hypertensive heart disease with heart failure: Secondary | ICD-10-CM | POA: Diagnosis not present

## 2021-02-15 DIAGNOSIS — E119 Type 2 diabetes mellitus without complications: Secondary | ICD-10-CM | POA: Diagnosis not present

## 2021-02-15 DIAGNOSIS — I5022 Chronic systolic (congestive) heart failure: Secondary | ICD-10-CM | POA: Diagnosis not present

## 2021-02-15 LAB — GLUCOSE, CAPILLARY
Glucose-Capillary: 114 mg/dL — ABNORMAL HIGH (ref 70–99)
Glucose-Capillary: 95 mg/dL (ref 70–99)

## 2021-02-15 LAB — MAGNESIUM: Magnesium: 2 mg/dL (ref 1.7–2.4)

## 2021-02-15 LAB — POCT ACTIVATED CLOTTING TIME
Activated Clotting Time: 178 seconds
Activated Clotting Time: 231 seconds

## 2021-02-15 LAB — BASIC METABOLIC PANEL
Anion gap: 9 (ref 5–15)
BUN: 12 mg/dL (ref 6–20)
CO2: 25 mmol/L (ref 22–32)
Calcium: 9.2 mg/dL (ref 8.9–10.3)
Chloride: 104 mmol/L (ref 98–111)
Creatinine, Ser: 0.92 mg/dL (ref 0.61–1.24)
GFR, Estimated: >60 mL/min (ref >60)
Glucose, Bld: 102 mg/dL — ABNORMAL HIGH (ref 70–99)
Potassium: 3.8 mmol/L (ref 3.5–5.1)
Sodium: 138 mmol/L (ref 135–145)

## 2021-02-15 MED ORDER — SOTALOL HCL 120 MG PO TABS: 120.0000 mg | ORAL_TABLET | Freq: Two times a day (BID) | ORAL | 5 refills | Status: DC

## 2021-02-15 MED ORDER — SOTALOL HCL 80 MG PO TABS
120.0000 mg | ORAL_TABLET | Freq: Two times a day (BID) | ORAL | Status: DC
  Administered 2021-02-15: 120 mg via ORAL
  Filled 2021-02-15: qty 2

## 2021-02-15 NOTE — Plan of Care (Signed)

## 2021-02-15 NOTE — Progress Notes (Signed)
D/C instructions given and reviewed. Tele and IV removed, tolerated well. Informed NT that pt is ready to transport OOF.

## 2021-02-15 NOTE — Anesthesia Postprocedure Evaluation (Signed)
Anesthesia Post Note  Patient: Gary Berry  Procedure(s) Performed: PVC ABLATION (N/A )     Patient location during evaluation: Cath Lab Anesthesia Type: MAC Level of consciousness: awake and alert Pain management: pain level controlled Vital Signs Assessment: post-procedure vital signs reviewed and stable Respiratory status: spontaneous breathing, nonlabored ventilation, respiratory function stable and patient connected to nasal cannula oxygen Cardiovascular status: stable and blood pressure returned to baseline Postop Assessment: no apparent nausea or vomiting Anesthetic complications: no   No complications documented.  Last Vitals:  Vitals:   02/15/21 0500 02/15/21 0811  BP: 112/67 120/73  Pulse: 61 67  Resp: 18   Temp: 36.8 C 37.1 C  SpO2: 97% 97%    Last Pain:  Vitals:   02/15/21 0811  TempSrc: Oral  PainSc:                  Belkis Norbeck

## 2021-02-19 ENCOUNTER — Other Ambulatory Visit: Payer: Self-pay

## 2021-02-19 ENCOUNTER — Ambulatory Visit (INDEPENDENT_AMBULATORY_CARE_PROVIDER_SITE_OTHER): Payer: 59 | Admitting: *Deleted

## 2021-02-19 VITALS — HR 69 | Ht 69.0 in | Wt 239.6 lb

## 2021-02-19 DIAGNOSIS — Z79899 Other long term (current) drug therapy: Secondary | ICD-10-CM

## 2021-02-19 NOTE — Progress Notes (Signed)
1.) Reason for visit: Medication management for Sotalol 120 mg every 12 hours.  2.) Name of MD requesting visit: Tommye Standard, PA  3.) H&P: Patient started on Sotalol 120 mg every 12 hours on 02/15/2021.  4.) ROS related to problem: no complains today. Taking medication as prescribed.  5.) Assessment and plan per MD:   Tommye Standard, PA reviewed EKG. Plan to continue medication with no changes. Follow up with Dr. Curt Bears as scheduled.  Placed EKG to be scanned in.

## 2021-02-19 NOTE — Patient Instructions (Incomplete)
Medication Instructions:  Your physician recommends that you continue on your current medications as directed. Please refer to the Current Medication list given to you today.  Labwork: None ordered.  Testing/Procedures: None ordered.  Follow-Up: Your physician wants you to follow-up in: 03/05/21 at 10:45 am with Dr. Curt Bears.   Any Other Special Instructions Will Be Listed Below (If Applicable).  If you need a refill on your cardiac medications before your next appointment, please call your pharmacy.

## 2021-02-26 ENCOUNTER — Encounter: Payer: Self-pay | Admitting: Neurology

## 2021-02-28 ENCOUNTER — Ambulatory Visit (INDEPENDENT_AMBULATORY_CARE_PROVIDER_SITE_OTHER): Payer: 59 | Admitting: Neurology

## 2021-02-28 ENCOUNTER — Encounter: Payer: Self-pay | Admitting: Neurology

## 2021-02-28 VITALS — BP 123/86 | HR 61 | Ht 69.0 in | Wt 244.3 lb

## 2021-02-28 DIAGNOSIS — I493 Ventricular premature depolarization: Secondary | ICD-10-CM | POA: Diagnosis not present

## 2021-02-28 DIAGNOSIS — G4733 Obstructive sleep apnea (adult) (pediatric): Secondary | ICD-10-CM | POA: Diagnosis not present

## 2021-02-28 DIAGNOSIS — Z9989 Dependence on other enabling machines and devices: Secondary | ICD-10-CM

## 2021-02-28 DIAGNOSIS — Z9889 Other specified postprocedural states: Secondary | ICD-10-CM

## 2021-02-28 NOTE — Patient Instructions (Signed)
It was good to see you again today.  Hopefully will continue to do well from the cardiac standpoint.  You are fully compliant with your AutoPap, keep up the good work!  If you feel that your snoring has become more loose or you wake up with a sense of gasping for air or have more witnessed breathing pauses as observed by her wife, we can always increase your maximum pressure from 11 to 12 cm.  For now, we will continue at the same settings.  Please continue using your autoPAP regularly. While your insurance requires that you use PAP at least 4 hours each night on 70% of the nights, I recommend, that you not skip any nights and use it throughout the night if you can. Getting used to PAP and staying with the treatment long term does take time and patience and discipline. Untreated obstructive sleep apnea when it is moderate to severe can have an adverse impact on cardiovascular health and raise her risk for heart disease, arrhythmias, hypertension, congestive heart failure, stroke and diabetes. Untreated obstructive sleep apnea causes sleep disruption, nonrestorative sleep, and sleep deprivation. This can have an impact on your day to day functioning and cause daytime sleepiness and impairment of cognitive function, memory loss, mood disturbance, and problems focussing. Using PAP regularly can improve these symptoms.  We can see you in 1 year, you can see one of our nurse practitioners as you are stable.

## 2021-02-28 NOTE — Progress Notes (Signed)
Subjective:    Patient ID: Gary Berry is a 50 y.o. male.  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  The patient is unaccompanied today. I last saw him on 08/29/20, at which time he was fully compliant with his autoPAP. He had not really noticed any difference in his sleep quality or daytime symptoms.  He had discussed ablation with Dr. Curt Berry in October 2021.  Flecainide had not helped him and he was tried on mexiletine.  He was advised to follow-up for sleep apnea in 6 months routinely and continue with his AutoPap.  Today, 02/28/2021: I reviewed his AutoPAP compliance data from 01/28/2021 through 02/26/2021, which is a total of 30 days, during which time he used his machine every night with percent used days greater than 4 hours at 100%, indicating superb compliance with an average usage of 7 hours and 35 minutes, residual AHI borderline at 5.4/h, 95th percentile of pressure at 10.7 cm with a range of 6 to 11 cm. He reports feeling about the same, he is compliant with his AutoPap.  He has not noticed a whole lot of difference in his symptoms, he is motivated to continue with treatment, has a follow-up appointment pending with Dr. Curt Berry.  A recent EKG did not show any significant PVCs.  He is no longer on metoprolol or mexiletine or flecainide.  He is currently on sotalol.  Creatinine has been stable, he tries to hydrate really well with water.  He drinks caffeine in the form of coffee, about 2 cups in the mornings and does not drink alcohol regularly. He had PVC ablation under Dr. Curt Berry on 02/14/2021.  Echocardiogram from December 2021 showed EF of 40 to 45%.  The patient's  allergies, current medications, family history, past medical history, past social history, past surgical history and problem list were reviewed and updated as appropriate.  Previously:    I first met him at the request of his primary care physician, at which time he reported snoring and sleep disruption.  He had recently been diagnosed with CHF and was found to have frequent PVCs.  He was advised to proceed with a sleep study.  He had a baseline sleep study on 05/20/2020 which showed a sleep latency of 35.5 minutes, REM latency 82 minutes, sleep efficiency 89.3%.  He had an increased percentage of stage I sleep but otherwise fairly normal sleep stage percentages, some sleep fragmentation was noted.  Total AHI was borderline at 5.1/h, REM AHI was in the mild range at 12.7/h, supine AHI also in the mild range at 13/h.  Average oxygen saturation 98%, nadir was 90%.  He had no significant PLM's.  EKG showed frequent PVCs including bigeminy and trigeminy.  He was advised to start AutoPap therapy given his cardiac history. His set up date was 07/26/20.    I reviewed his AutoPap compliance data from 07/28/2020 through 08/26/2020, which is a total of 30 days, during which time he used his machine every night with percent use days greater than 4 hours at 100%, indicating superb compliance with an average usage of 8 hours and 12 minutes, residual AHI 3.6/h, 95th percentile of pressure at 10.4 cm with a range of 6 to 11 cm with  EPR of 3, leak on the low side with a 95th percentile at 2.5 L/min.    04/30/20: (He) reports snoring and some sleep disruption.  He was recently found to have worsening left ventricular function with an EF estimated around 30 to 40%.  He was recently found to have PVCs.  He is scheduled to see cardiology tomorrow.  He denies any chest pain, shortness of breath or palpitations.  He has nocturia about once or twice per average night, denies recurrent morning headaches.  Father has sleep apnea and has  a CPAP machine and recently also had open heart surgery for quadruple bypass.  Patient reports going to bed around 830 but does not typically fall asleep until 10 or even 11, rise time is around 7.  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  He lives with his wife, currently have 4 cats in the household, their son is grown, age 47.  [name], has reduced his caffeine intake recently and limits himself currently to 1 cup of coffee per day.  He drinks alcohol in the form of beer or wine, 6 or 7 servings per average week. His Epworth sleepiness score 4 out of 24, fatigue severity score is 17/63.   His Past Medical History Is Significant For: Past Medical History:  Diagnosis Date  . Chronic right shoulder pain, requiring injection prn 03/20/2017  . 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 Hyperlipidemia   . Hyperlipidemia associated with type 2 diabetes mellitus ([facility]) 11/21/2016  . Hypertension associated with diabetes ([facility]) 11/21/2016  . Obesity, Class II, BMI 35-39.9 11/21/2016  . Systolic congestive heart failure with reduced left ventricular function, NYHA class 2 ([facility]) 04/24/2020   Echocardiogram 04/2020:  Ef 35-40%, global hypokinesis  . Type 2 diabetes mellitus without complication, without long-term current use of insulin ([facility]) 11/21/2016    His Past Surgical History Is Significant For: Past Surgical History:  Procedure Laterality Date  . HERNIA REPAIR  2016   hard to wake up after sx  . PVC ABLATION N/A 02/14/2021   Procedure: PVC ABLATION;  Surgeon: Gary Haw, MD;  Location: [facility];  Service: Cardiovascular;  Laterality: N/A;  . RIGHT/LEFT HEART CATH AND CORONARY ANGIOGRAPHY N/A 05/04/2020   Procedure: RIGHT/LEFT HEART CATH AND CORONARY ANGIOGRAPHY;  Surgeon: Gary Man, MD;  Location: [facility];  Service: Cardiovascular;  Laterality: N/A;  . TONSILLECTOMY  1975  .  VASECTOMY      His Family History Is Significant For: Family History  Problem Relation Age of Onset  . Diabetes Mother   . Hypertension Mother   . Atrial fibrillation Father   . Colon polyps Father   . Colon cancer Neg Hx   . Esophageal cancer Neg Hx   . Rectal cancer Neg Hx   . Stomach cancer Neg Hx     His Social History Is Significant For: Social History   Socioeconomic History  . Marital status: Married    Spouse name: Dennard Nip, MD  . Number of children: 1  . Years of education: BA  . Highest education level: Not on file  Occupational History  . Occupation: Primary school teacher: [employer]  Tobacco Use  . Smoking status: Never Smoker  . Smokeless tobacco: Former Systems developer    Types: Secondary school teacher  . Vaping Use: Never used  Substance and Sexual Activity  .  Alcohol use: Yes    Alcohol/week: 3.0 standard drinks    Types: 3 Standard drinks or equivalent per week    Comment: social  . Drug use: No  . Sexual activity: Yes    Partners: Female  Other Topics Concern  . Not on file  Social History Narrative   Caffeine use: 2 cups coffee per day   Caffeine free soda      Right handed    Social Determinants of Health   Financial Resource Strain: Not on file  Food Insecurity: Not on file  Transportation Needs: Not on file  Physical Activity: Not on file  Stress: Not on file  Social Connections: Not on file    His Allergies Are:  No Known Allergies:   His Current Medications Are:  Outpatient Encounter Medications as of 02/28/2021  Medication Sig  . aspirin EC 81 MG tablet Take 81 mg by mouth daily. Swallow whole.  . B-D UF III MINI PEN NEEDLES 31G X 5 MM MISC See admin instructions.  . cetirizine (ZYRTEC) 10 MG tablet Take 10 mg by mouth daily as needed for allergies.  . Cholecalciferol (VITAMIN D) 125 MCG (5000 UT) CAPS Take 5,000 Units by mouth daily.   . Cyanocobalamin (VITAMIN B12) 1000 MCG TBCR Take 1,000 mcg by mouth daily.   Marland Kitchen ibuprofen (ADVIL) 200 MG  tablet Take 400 mg by mouth every 6 (six) hours as needed for headache or moderate pain.  Marland Kitchen liraglutide (VICTOZA) 18 MG/3ML SOPN Inject 1.8 mg into the skin every morning.  . naproxen sodium (ALEVE) 220 MG tablet Take 220 mg by mouth daily as needed (pain).  . Omega-3 Fatty Acids (FISH OIL) 1000 MG CAPS Take 2,000 mg by mouth daily.  . rosuvastatin (CRESTOR) 10 MG tablet Take 1 tablet (10 mg total) by mouth daily.  . sacubitril-valsartan (ENTRESTO) 49-51 MG Take 1 tablet by mouth 2 (two) times daily.  . sotalol (BETAPACE) 120 MG tablet Take 1 tablet (120 mg total) by mouth every 12 (twelve) hours.  . tadalafil (CIALIS) 5 MG tablet TAKE 1 TABLET BY MOUTH DAILY AS NEEDED FOR ERECTILE DYSFUNCTION (Patient taking differently: Take 5 mg by mouth daily as needed for erectile dysfunction. TAKE 1 TABLET BY MOUTH DAILY AS NEEDED FOR ERECTILE DYSFUNCTION)  . [DISCONTINUED] calcium carbonate (TUMS - DOSED IN MG ELEMENTAL CALCIUM) 500 MG chewable tablet Chew 1,000 mg by mouth daily as needed for indigestion or heartburn. (Patient not taking: Reported on 02/28/2021)   No facility-administered encounter medications on file as of 02/28/2021.  :  Review of Systems:  Out of a complete 14 point review of systems, all are reviewed and negative with the exception of these symptoms as listed below: Review of Systems  Neurological:       Here for 6 month f/u reports he is doing well.     Objective:  Neurological Exam  Physical Exam Physical Examination:   Vitals:   02/28/21 0931  BP: 123/86  Pulse: 61    General Examination: The patient is a very pleasant 50 y.o. male in no acute distress. He appears well-developed and well-nourished and well groomed.   HEENT:Normocephalic, atraumatic, pupils are equal, round and reactive to light, extraocular tracking is good without limitation to gaze excursion or nystagmus noted. Hearing is grossly intact. Face is symmetric with normal facial animation.  Possible eye  blinking tics noted.  Speech is clear with no dysarthria noted. There is no hypophonia. There is no lip, neck/head, jaw or voice  tremor. Neck is supple with full range of passive and active motion. There are no carotid bruits on auscultation. Oropharynx exam reveals: no signif.mouth dryness, gooddental hygiene and moderateairway crowding.  Tongue protrudes centrally and palate elevates symmetrically.  Chest:Clear to auscultation without wheezing, rhonchi or crackles noted.  Heart:S1+S2+0, regular and normal without murmurs, rubs or gallops noted.   Abdomen:Soft, non-tender and non-distended.  Extremities:There isnopitting edema in the distal lower extremities bilaterally.   Skin: Warm and dry without trophic changes noted.   Musculoskeletal: exam reveals no obvious joint deformities, tenderness or joint swelling or erythema.   Neurologically:  Mental status: The patient is awake, alert and oriented in all 4 spheres.Hisimmediate and remote memory, attention, language skills and fund of knowledge are appropriate. There is no evidence of aphasia, agnosia, apraxia or anomia. Speech is clear with normal prosody and enunciation. Thought process is linear. Mood is normaland affect is normal.  Cranial nerves II - XII are as described above under HEENT exam.  Motor exam: Normal bulk, strength and tone is noted. There is no tremor, Romberg is negative. Fine motor skills and coordination: grossly intact.  Cerebellar testing: No dysmetria or intention tremor. There is no truncal or gait ataxia.  Sensory exam: intact to light touch in the upper and lower extremities.  Gait, station and balance:Hestands easily. No veering to one side is noted. No leaning to one side is noted. Posture is age-appropriate and stance is narrow based. Gait showsnormalstride length and normalpace. No problems turning are noted. Tandem walk is unremarkable.   Assessmentand Plan:  [place]  [place] a very pleasant 30 year oldmalewith an underlying medical history of hypertension, diabetes, hyperlipidemia, chronic shoulder pain, chronic systolic congestive heart failure, obesity, and frequent PVCs, who presents for follow-up consultation of his obstructive sleep apnea, established on AutoPap therapy. Baseline sleep testing from June 08, 2028 suggested rather mild sleep apnea with an AHI of 5.1/h, O2 nadir of 90%.  He has established treatment with AutoPap therapy since July 26, 2020 and is fully compliant with treatment.  He is highly commended for his treatment adherence.  He has not noticed any telltale improvement in his symptoms but treatment was primarily initiated on the basis of his cardiac history.  His last echocardiogram showed an EF of 40 to 45%.  He had cardiac PVC ablation in late April 2022 and a follow-up with Dr. Curt Berry pending.  AHI is borderline currently but we mutually agreed not to increase his maximum pressure, we could consider this should he have residual symptoms such as residual snoring or witnessed apneas while on treatment.  He is encouraged to continue with treatment primarily from the standpoint of cardiac prevention.  He is motivated to continue with treatment, he is advised to call us if he would like to try a slightly higher pressure for the maximum pressure, we could increase it from 11 cm to 12 cm.  He would like to hold off as he has noticed some leak and some discomfort with the mask.  He is advised to follow-up routinely in 1 year to see one of our nurse practitioners, sooner if the need arises.  I answered all his questions today and he was in agreement. I spent 20 minutes in total face-to-face time and in reviewing records during pre-charting, more than 50% of which was spent in counseling and coordination of care, reviewing test results, reviewing medications and treatment regimen and/or in discussing or reviewing the diagnosis of OSA,  the prognosis and treatment  options. Pertinent laboratory and imaging test results that were available during this visit with the patient were reviewed by me and considered in my medical decision making (see chart for details).

## 2021-03-05 ENCOUNTER — Encounter: Payer: Self-pay | Admitting: Cardiology

## 2021-03-05 ENCOUNTER — Ambulatory Visit (INDEPENDENT_AMBULATORY_CARE_PROVIDER_SITE_OTHER): Payer: 59

## 2021-03-05 ENCOUNTER — Ambulatory Visit (INDEPENDENT_AMBULATORY_CARE_PROVIDER_SITE_OTHER): Payer: 59 | Admitting: Cardiology

## 2021-03-05 ENCOUNTER — Other Ambulatory Visit: Payer: Self-pay

## 2021-03-05 VITALS — BP 146/74 | HR 60 | Ht 69.0 in | Wt 245.0 lb

## 2021-03-05 DIAGNOSIS — I493 Ventricular premature depolarization: Secondary | ICD-10-CM

## 2021-03-05 NOTE — Patient Instructions (Addendum)
Medication Instructions:  Your physician recommends that you continue on your current medications as directed. Please refer to the Current Medication list given to you today.  *If you need a refill on your cardiac medications before your next appointment, please call your pharmacy*   Lab Work: None ordered   Testing/Procedures: Your physician has recommended that you wear a 3 day holter monitor. Holter monitors are medical devices that record the heart's electrical activity. Doctors most often use these monitors to diagnose arrhythmias. Arrhythmias are problems with the speed or rhythm of the heartbeat. The monitor is a small, portable device. You can wear one while you do your normal daily activities. This is usually used to diagnose what is causing palpitations/syncope (passing out).  See instructions below under "other instructions"    Follow-Up: At [facility], you and your health needs are our priority.  As part of our continuing mission to provide you with exceptional heart care, we have created designated Provider Care Teams.  These Care Teams include your primary Cardiologist (physician) and Advanced Practice Providers (APPs -  Physician Assistants and Nurse Practitioners) who all work together to provide you with the care you need, when you need it.     Your next appointment:   2 month(s)  The format for your next appointment:   In Person  Provider:   Tommye Standard, PA-C    Thank you for choosing [facility]!!   Trinidad Curet, RN [phone]   Other Instructions                             ZIO XT- Long Term Monitor Instructions   Your physician has requested you wear a ZIO patch monitor for 3  days.  This is a single patch monitor.   [employer] supplies one patch monitor per enrollment. Additional stickers are not available. Please do not apply patch if you will be having a Nuclear Stress Test, Echocardiogram, Cardiac CT, MRI, or Chest Xray during the period  you would be wearing the monitor. The patch cannot be worn during these tests. You cannot remove and re-apply the ZIO XT patch monitor.  Your ZIO patch monitor will be sent Fed Ex from [employer] directly to your home address. It may take 3-5 days to receive your monitor after you have been enrolled.  Once you have received your monitor, please review the enclosed instructions. Your monitor has already been registered assigning a specific monitor serial # to you.  Billing and Patient Assistance Program Information   We have supplied [employer] with any of your insurance information on file for billing purposes. [employer] offers a sliding scale Patient Assistance Program for patients that do not have insurance, or whose insurance does not completely cover the cost of the ZIO monitor.   You must apply for the Patient Assistance Program to qualify for this discounted rate.     To apply, please call [employer] at [phone], select option 4, then select option 2, and ask to apply for Patient Assistance Program.  Theodore Demark will ask your household income, and how many people are in your household.  They will quote your out-of-pocket cost based on that information.  [employer] will also be able to set up a 69-month, interest-free payment plan if needed.  Applying the monitor   Shave hair from upper left chest.  Hold abrader disc by orange tab. Rub abrader in 40 strokes over the upper left chest as indicated  in your monitor instructions.  Clean area with 4 enclosed alcohol pads. Let dry.  Apply patch as indicated in monitor instructions. Patch will be placed under collarbone on left side of chest with arrow pointing upward.  Rub patch adhesive wings for 2 minutes. Remove white label marked "1". Remove the white label marked "2". Rub patch adhesive wings for 2 additional minutes.  While looking in a mirror, press and release button in center of patch. A small green light will flash 3-4 times. This will be your  only indicator that the monitor has been turned on. ?  Do not shower for the first 24 hours. You may shower after the first 24 hours.  Press the button if you feel a symptom. You will hear a small click. Record Date, Time and Symptom in the Patient Logbook.  When you are ready to remove the patch, follow instructions on the last 2 pages of the Patient Logbook. Stick patch monitor onto the last page of Patient Logbook.  Place Patient Logbook in the blue and white box.  Use locking tab on box and tape box closed securely.  The blue and white box has prepaid postage on it. Please place it in the mailbox as soon as possible. Your physician should have your test results approximately 7 days after the monitor has been mailed back to [facility].  Call [place] at [phone] if you have questions regarding your ZIO XT patch monitor. Call them immediately if you see an orange light blinking on your monitor.  If your monitor falls off in less than 4 days, contact our Monitor department at [phone]. ?If your monitor becomes loose or falls off after 4 days call [employer] at [phone] for suggestions on securing your monitor.?

## 2021-03-05 NOTE — Progress Notes (Signed)
Electrophysiology Office Note   Date:  03/05/2021   ID:  Gary Berry, DOB 10-04-1971, MRN [id number]  PCP:  Leamon Arnt, MD  Cardiologist: Debara Pickett Primary Electrophysiologist:  Deliah Strehlow Meredith Leeds, MD    Chief Complaint: PVC   History of Present Illness: Gary Berry is a 50 y.o. male who is being seen today for the evaluation of PVC at the request of Leamon Arnt, MD. Presenting today for electrophysiology evaluation.  He has a history of hypertension,.  He was found to have an elevated PVC burden 19%.  He had an attempted PVC ablation 02/14/2021, though PVCs were right coronary artery.  He has since been started on sotalol.  Today, denies symptoms of palpitations, chest pain, shortness of breath, orthopnea, PND, lower extremity edema, claudication, dizziness, presyncope, syncope, bleeding, or neurologic sequela. The patient is tolerating medications without difficulties.  Since being started on the sotalol he has done well.  He feels about the same as he did prior to his ablation, though he was minimally symptomatic at that time.  Just after the ablation, he felt fatigued, had difficulty mowing with lawn.  Despite that, he mowed the grass yesterday and did well.  His level of fatigue is greatly improved.   Past Medical History:  Diagnosis Date  . Chronic right shoulder pain, requiring injection prn 03/20/2017  . 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 Hyperlipidemia   . Hyperlipidemia associated with type 2 diabetes mellitus ([place]) 11/21/2016  . Hypertension associated with diabetes ([place]) 11/21/2016  . Obesity, Class II, BMI 35-39.9 11/21/2016  . Systolic congestive heart failure with reduced left ventricular function, NYHA class 2 ([place]) 04/24/2020   Echocardiogram 04/2020:  Ef 35-40%, global hypokinesis  . Type 2 diabetes mellitus without complication, without long-term current use of insulin ([place]) 11/21/2016   Past Surgical History:  Procedure Laterality Date  .  HERNIA REPAIR  2016   hard to wake up after sx  . PVC ABLATION N/A 02/14/2021   Procedure: PVC ABLATION;  Surgeon: Constance Haw, MD;  Location: [facility];  Service: Cardiovascular;  Laterality: N/A;  . RIGHT/LEFT HEART CATH AND CORONARY ANGIOGRAPHY N/A 05/04/2020   Procedure: RIGHT/LEFT HEART CATH AND CORONARY ANGIOGRAPHY;  Surgeon: Leonie Man, MD;  Location: [facility];  Service: Cardiovascular;  Laterality: N/A;  . TONSILLECTOMY  1975  . VASECTOMY       Current Outpatient Medications  Medication Sig Dispense Refill  . aspirin EC 81 MG tablet Take 81 mg by mouth daily. Swallow whole.    . B-D UF III MINI PEN NEEDLES 31G X 5 MM MISC See admin instructions.    . cetirizine (ZYRTEC) 10 MG tablet Take 10 mg by mouth daily as needed for allergies.    . Cholecalciferol (VITAMIN D) 125 MCG (5000 UT) CAPS Take 5,000 Units by mouth daily.     . Cyanocobalamin (VITAMIN B12) 1000 MCG TBCR Take 1,000 mcg by mouth daily.     Marland Kitchen ibuprofen (ADVIL) 200 MG tablet Take 400 mg by mouth every 6 (six) hours as needed for headache or moderate pain.    Marland Kitchen liraglutide (VICTOZA) 18 MG/3ML SOPN Inject 1.8 mg into the skin every morning.    . naproxen sodium (ALEVE) 220 MG tablet Take 220 mg by mouth daily as needed (pain).    . Omega-3 Fatty Acids (FISH OIL) 1000 MG CAPS Take 2,000 mg by mouth daily.    . rosuvastatin (CRESTOR) 10 MG tablet  Take 1 tablet (10 mg total) by mouth daily. 90 tablet 3  . sacubitril-valsartan (ENTRESTO) 49-51 MG Take 1 tablet by mouth 2 (two) times daily. 180 tablet 3  . sotalol (BETAPACE) 120 MG tablet Take 1 tablet (120 mg total) by mouth every 12 (twelve) hours. 60 tablet 5  . tadalafil (CIALIS) 5 MG tablet TAKE 1 TABLET BY MOUTH DAILY AS NEEDED FOR ERECTILE DYSFUNCTION (Patient taking differently: Take 5 mg by mouth daily as needed for erectile dysfunction. TAKE 1 TABLET BY MOUTH DAILY AS NEEDED FOR ERECTILE DYSFUNCTION) 90 tablet 1   No current  facility-administered medications for this visit.    Allergies:   Patient has no known allergies.   Social History:  The patient  reports that he has never smoked. He quit smokeless tobacco use about 23 years ago.  His smokeless tobacco use included chew. He reports current alcohol use of about 3.0 standard drinks of alcohol per week. He reports that he does not use drugs.   Family History:  The patient's family history includes Atrial fibrillation in his father; Colon polyps in his father; Diabetes in his mother; Hypertension in his mother.   ROS:  Please see the history of present illness.   Otherwise, review of systems is positive for none.   All other systems are reviewed and negative.   PHYSICAL EXAM: VS:  BP (!) 146/74   Pulse 60   Ht 5\' 9"  (1.753 m)   Wt 245 lb (111.1 kg)   BMI 36.18 kg/m  , BMI Body mass index is 36.18 kg/m. GEN: Well nourished, well developed, in no acute distress  HEENT: normal  Neck: no JVD, carotid bruits, or masses Cardiac: RRR; no murmurs, rubs, or gallops,no edema  Respiratory:  clear to auscultation bilaterally, normal work of breathing GI: soft, nontender, nondistended, + BS MS: no deformity or atrophy  Skin: warm and dry Neuro:  Strength and sensation are intact Psych: euthymic mood, full affect  EKG:  EKG is ordered today. Personal review of the ekg ordered shows sinus rhythm, PVCs, rate 68  Recent Labs: 03/30/2020: ALT 22; TSH 1.28 02/12/2021: Hemoglobin 15.3; Platelets 237 02/15/2021: BUN 12; Creatinine, Ser 0.92; Magnesium 2.0; Potassium 3.8; Sodium 138    Lipid Panel     Component Value Date/Time   CHOL 142 07/19/2020 0905   TRIG 137 07/19/2020 0905   HDL 47 07/19/2020 0905   CHOLHDL 3.0 07/19/2020 0905   VLDL 54.6 (H) 08/02/2019 0919   LDLCALC 73 07/19/2020 0905   LDLDIRECT 69.0 08/02/2019 0919     Wt Readings from Last 3 Encounters:  03/05/21 245 lb (111.1 kg)  02/28/21 244 lb 5 oz (110.8 kg)  02/19/21 239 lb 9.6 oz (108.7  kg)      Other studies Reviewed: Additional studies/ records that were reviewed today include: TTE 09/27/20 Review of the above records today demonstrates:  1. EF similar to echo done 04/19/20 diffuse hypokinesis worse in inferior  base . Left ventricular ejection fraction, by estimation, is 40 to 45%.  The left ventricle has mildly decreased function. The left ventricle has  no regional wall motion  abnormalities. The left ventricular internal cavity size was mildly  dilated. Left ventricular diastolic parameters were normal.  2. Right ventricular systolic function is moderately reduced. The right  ventricular size is moderately enlarged.  3. Left atrial size was moderately dilated.  4. The mitral valve is normal in structure. Trivial mitral valve  regurgitation. No evidence of mitral stenosis.  5. The aortic valve is tricuspid. Aortic valve regurgitation is not  visualized. No aortic stenosis is present.  6. The inferior vena cava is normal in size with greater than 50%  respiratory variability, suggesting right atrial pressure of 3 mmHg.   Cardiac monitor 07/03/2020 personally reviewed Sinus rhythm with frequent PVC's at 14% burden.   ASSESSMENT AND PLAN:  1.  PVCs: Normal coronary arteries by recent catheterization.  PVC burden 14%.  Had an attempted PVC ablation on 02/14/2021.  Unfortunately PVCs were coming from very close to the right coronary artery.  He was started on sotalol.  High risk medication monitoring.  He does have 2 PVCs on his ECG today, but on auscultation, I did not hear evidence of PVCs.  We Derryck Shahan order a 3-day monitor to check the effectiveness of sotalol.  2.  Nonischemic cardiomyopathy: Potentially Toprol XL and Entresto per primary cardiology.  Repeat echo now that his PVC burden has reduced.  3.  Hypertension: Mildly elevated today but usually well controlled.  Current medicines are reviewed at length with the patient today.   The patient does not have  concerns regarding his medicines.  The following changes were made today: None  Labs/ tests ordered today include:  Orders Placed This Encounter  Procedures  . LONG TERM MONITOR (3-14 DAYS)  . EKG 12-Lead     Disposition:   FU with Erza [place] 6 months  Signed, Miracle Criado Meredith Leeds, MD  03/05/2021 10:55 AM     [facility] [address] [place] [address] [phone] (office) [phone] (fax)

## 2021-03-05 NOTE — Progress Notes (Unsigned)
Patient enrolled for [employer] to ship a 3 day ZIO XT long term holter monitor to his home.

## 2021-03-10 DIAGNOSIS — I493 Ventricular premature depolarization: Secondary | ICD-10-CM | POA: Diagnosis not present

## 2021-03-15 ENCOUNTER — Ambulatory Visit: Payer: 59 | Admitting: Cardiology

## 2021-03-26 ENCOUNTER — Telehealth: Payer: Self-pay | Admitting: *Deleted

## 2021-03-26 MED ORDER — SOTALOL HCL 160 MG PO TABS: 320.0000 mg | ORAL_TABLET | Freq: Two times a day (BID) | ORAL | 3 refills | Status: DC

## 2021-03-26 NOTE — Telephone Encounter (Signed)
Informed patient of results and verbal understanding expressed. Pt will start the increase dose on Friday. RN EKG visit scheduled for Monday 6/13. Advised to call office if issues/SE occur after medication increase. Patient verbalized understanding and agreeable to plan.

## 2021-03-26 NOTE — Telephone Encounter (Signed)
-----   Message from Will Meredith Leeds, MD sent at 03/22/2021  3:23 PM EDT ----- PVC burden elevated. Increase sotalol to 160 mg BID with ECG 3 days later.

## 2021-04-01 ENCOUNTER — Ambulatory Visit (INDEPENDENT_AMBULATORY_CARE_PROVIDER_SITE_OTHER): Payer: 59 | Admitting: *Deleted

## 2021-04-01 ENCOUNTER — Ambulatory Visit (INDEPENDENT_AMBULATORY_CARE_PROVIDER_SITE_OTHER): Payer: 59 | Admitting: Family Medicine

## 2021-04-01 ENCOUNTER — Encounter: Payer: Self-pay | Admitting: Family Medicine

## 2021-04-01 ENCOUNTER — Other Ambulatory Visit: Payer: Self-pay

## 2021-04-01 VITALS — BP 126/70 | HR 62 | Temp 97.8°F | Resp 16 | Ht 69.0 in | Wt 244.6 lb

## 2021-04-01 VITALS — HR 62 | Ht 69.0 in

## 2021-04-01 DIAGNOSIS — I152 Hypertension secondary to endocrine disorders: Secondary | ICD-10-CM

## 2021-04-01 DIAGNOSIS — E785 Hyperlipidemia, unspecified: Secondary | ICD-10-CM

## 2021-04-01 DIAGNOSIS — Z0000 Encounter for general adult medical examination without abnormal findings: Secondary | ICD-10-CM

## 2021-04-01 DIAGNOSIS — E1159 Type 2 diabetes mellitus with other circulatory complications: Secondary | ICD-10-CM | POA: Diagnosis not present

## 2021-04-01 DIAGNOSIS — I5020 Unspecified systolic (congestive) heart failure: Secondary | ICD-10-CM | POA: Diagnosis not present

## 2021-04-01 DIAGNOSIS — E1169 Type 2 diabetes mellitus with other specified complication: Secondary | ICD-10-CM

## 2021-04-01 DIAGNOSIS — I493 Ventricular premature depolarization: Secondary | ICD-10-CM

## 2021-04-01 DIAGNOSIS — Z79899 Other long term (current) drug therapy: Secondary | ICD-10-CM | POA: Diagnosis not present

## 2021-04-01 DIAGNOSIS — E119 Type 2 diabetes mellitus without complications: Secondary | ICD-10-CM | POA: Diagnosis not present

## 2021-04-01 DIAGNOSIS — E669 Obesity, unspecified: Secondary | ICD-10-CM

## 2021-04-01 DIAGNOSIS — F4323 Adjustment disorder with mixed anxiety and depressed mood: Secondary | ICD-10-CM

## 2021-04-01 LAB — COMPREHENSIVE METABOLIC PANEL
ALT: 20 U/L (ref 0–53)
AST: 20 U/L (ref 0–37)
Albumin: 4.5 g/dL (ref 3.5–5.2)
Alkaline Phosphatase: 48 U/L (ref 39–117)
BUN: 18 mg/dL (ref 6–23)
CO2: 25 mEq/L (ref 19–32)
Calcium: 9.4 mg/dL (ref 8.4–10.5)
Chloride: 104 mEq/L (ref 96–112)
Creatinine, Ser: 0.94 mg/dL (ref 0.40–1.50)
GFR: 93.31 mL/min (ref >60.00)
Glucose, Bld: 95 mg/dL (ref 70–99)
Potassium: 4 mEq/L (ref 3.5–5.1)
Sodium: 139 mEq/L (ref 135–145)
Total Bilirubin: 0.6 mg/dL (ref 0.2–1.2)
Total Protein: 7.1 g/dL (ref 6.0–8.3)

## 2021-04-01 LAB — CBC WITH DIFFERENTIAL/PLATELET
Basophils Absolute: 0.1 10*3/uL (ref 0.0–0.1)
Basophils Relative: 1.2 % (ref 0.0–3.0)
Eosinophils Absolute: 0.2 10*3/uL (ref 0.0–0.7)
Eosinophils Relative: 2.8 % (ref 0.0–5.0)
HCT: 47.5 % (ref 39.0–52.0)
Hemoglobin: 15.9 g/dL (ref 13.0–17.0)
Lymphocytes Relative: 29.4 % (ref 12.0–46.0)
Lymphs Abs: 2 10*3/uL (ref 0.7–4.0)
MCHC: 33.4 g/dL (ref 30.0–36.0)
MCV: 93.6 fl (ref 78.0–100.0)
Monocytes Absolute: 0.6 10*3/uL (ref 0.1–1.0)
Monocytes Relative: 9.4 % (ref 3.0–12.0)
Neutro Abs: 3.8 10*3/uL (ref 1.4–7.7)
Neutrophils Relative %: 57.2 % (ref 43.0–77.0)
Platelets: 220 10*3/uL (ref 150.0–400.0)
RBC: 5.07 Mil/uL (ref 4.22–5.81)
RDW: 14.3 % (ref 11.5–15.5)
WBC: 6.7 10*3/uL (ref 4.0–10.5)

## 2021-04-01 LAB — LIPID PANEL
Cholesterol: 159 mg/dL (ref 0–200)
HDL: 44.9 mg/dL (ref >39.00)
LDL Cholesterol: 86 mg/dL (ref 0–99)
NonHDL: 113.72
Total CHOL/HDL Ratio: 4
Triglycerides: 139 mg/dL (ref 0.0–149.0)
VLDL: 27.8 mg/dL (ref 0.0–40.0)

## 2021-04-01 LAB — TSH: TSH: 1.25 u[IU]/mL (ref 0.35–4.50)

## 2021-04-01 LAB — POCT GLYCOSYLATED HEMOGLOBIN (HGB A1C): Hemoglobin A1C: 5.4 % (ref 4.0–5.6)

## 2021-04-01 MED ORDER — BUPROPION HCL ER (XL) 150 MG PO TB24: 150.0000 mg | ORAL_TABLET | Freq: Every day | ORAL | 1 refills | Status: DC

## 2021-04-01 NOTE — Progress Notes (Signed)
Subjective  Chief Complaint  Patient presents with   Diabetes   Hyperlipidemia   Congestive Heart Failure    HPI: Gary Berry is a 50 y.o. male who presents to [facility] today for a Male Wellness Visit. He also has the concerns and/or needs as listed above in the chief complaint. These will be addressed in addition to the Health Maintenance Visit.   Wellness Visit: annual visit with health maintenance review and exam   Health maintenance: Colon cancer screening is up-to-date.  Immunizations are current.  Overall, feels well  Body mass index is 36.12 kg/m. 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  They are adjusting up his medication doses due to persistent heavy PVC burden on recent monitoring.  Patient has minimal symptoms.  However, because he has failed to convert or improve with medications and ablation, he is frustrated. Mood: Reports symptoms of mild depression and frustration.  Mainly has increased reactivity, irritability and frustration.  Some mild decrease enjoyment.  He has no history of mood disorder.  No history of depression.  He denies panic symptoms.  It has not affected his function, sleep or appetite.  However has affected his relationship somewhat.  His wife is aware of his mood change.  It is burdensome enough that he is ready to get help. Hypertension history of diabetes: Both are well controlled.  He is on medications for heart rate and hypertension, on Victoza daily for history of diabetes and weight management. Hyperlipidemia on statin which is well-tolerated.  Nonfasting for recheck today. Obesity: Stable weight.  Patient Active Problem List   Diagnosis Date Noted   PVC  (premature ventricular contraction) 02/14/2021   Frequent PVCs 28/78/6767   Systolic congestive heart failure with reduced left ventricular function, NYHA class 2 ([place]) 04/24/2020   Type 2 diabetes mellitus without complication, without long-term current use of insulin ([place]) 11/21/2016   Hypertension associated with diabetes ([place]) 11/21/2016   Hyperlipidemia associated with type 2 diabetes mellitus ([place]) 11/21/2016   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   Obesity, Class II, BMI 35-39.9 20/94/7096   Umbilical hernia 28/36/6294   Health Maintenance  Topic Date Due   Pneumococcal Vaccine 26-28 Years old (1 - PCV) Never done   OPHTHALMOLOGY EXAM  03/16/2021   INFLUENZA VACCINE  05/20/2021   COVID-19 Vaccine (5 - Booster for Pfizer series) 06/10/2021   HEMOGLOBIN A1C  10/01/2021   TETANUS/TDAP  12/17/2021   FOOT EXAM  04/01/2022   COLONOSCOPY (Pts 45-39yrs Insurance coverage will need to be confirmed)  07/28/2029   PNEUMOCOCCAL POLYSACCHARIDE VACCINE AGE 62-64 HIGH RISK  Completed   Hepatitis C Screening  Completed   Zoster Vaccines- Shingrix  Completed   HPV VACCINES  Aged Out   HIV Screening  Discontinued   Immunization History  Administered Date(s) Administered   Influenza Inj Mdck Quad Pf 08/01/2017   Influenza, Seasonal, Injecte, Preservative Fre 08/01/2017   Influenza,inj,Quad PF,6+ Mos 12/06/2015, 11/21/2016, 08/22/2018, 07/19/2020   Influenza-Unspecified 08/22/2018, 08/01/2019   PFIZER Comirnaty(Gray Top)Covid-19 Tri-Sucrose Vaccine 02/08/2021   PFIZER(Purple Top)SARS-COV-2 Vaccination 12/30/2019, 01/20/2020, 08/18/2020   Pneumococcal Polysaccharide-23 05/25/2017   Tdap 12/18/2011   Zoster Recombinat (Shingrix) 09/28/2020, 12/27/2020   We  updated and reviewed the patient's past history in detail and it is documented below. Allergies: Patient has No Known Allergies. Past Medical History  has a past medical history of Chronic right shoulder pain, requiring  injection prn (03/20/2017), ED (erectile dysfunction) of organic origin (11/21/2016), Hyperlipidemia, Hyperlipidemia associated with type 2 diabetes mellitus ([place]) (11/21/2016), Hypertension associated with diabetes ([place]) (11/21/2016), Obesity, Class II, BMI 35-39.9 (0/04/3709), Systolic congestive heart failure with reduced left ventricular function, NYHA class 2 ([place]) (04/24/2020), and Type 2 diabetes mellitus without complication, without long-term current use of insulin ([place]) (11/21/2016). Past Surgical History Patient  has a past surgical history that includes Tonsillectomy (1975); Hernia repair (2016); Vasectomy; RIGHT/LEFT HEART CATH AND CORONARY ANGIOGRAPHY (N/A, 05/04/2020); and PVC ABLATION (N/A, 02/14/2021). Social History Patient  reports that he has never smoked. He quit smokeless tobacco use about 23 years ago.  His smokeless tobacco use included chew. He reports current alcohol use of about 3.0 standard drinks of alcohol per week. He reports that he does not use drugs. Family History family history includes Atrial fibrillation in his father; Colon polyps in his father; Diabetes in his mother; Hypertension in his mother. Review of Systems: Constitutional: negative for fever or malaise Ophthalmic: negative for photophobia, double vision or loss of vision Cardiovascular: negative for chest pain, dyspnea on exertion, or new LE swelling Respiratory: negative for SOB or persistent cough Gastrointestinal: negative for abdominal pain, change in bowel habits or melena Genitourinary: negative for dysuria or gross hematuria Musculoskeletal: negative for new gait disturbance or muscular weakness Integumentary: negative for new or persistent rashes Neurological: negative for TIA or stroke symptoms Psychiatric: negative for SI or delusions Allergic/Immunologic: negative for hives  Patient Care Team    Relationship Specialty Notifications Start End  Leamon Arnt, MD PCP - General Family Medicine  01/12/20    Constance Haw, MD PCP - Electrophysiology Cardiology  08/06/20   Pixie Casino, MD Consulting Physician Cardiology  05/03/20    Objective  Vitals: BP 126/70   Pulse 62   Temp 97.8 F (36.6 C) (Oral)   Resp 16   Ht 5\' 9"  (1.753 m)   Wt 244 lb 9.6 oz (110.9 kg)   SpO2 97%   BMI 36.12 kg/m  General:  Well developed, well nourished, no acute distress  Psych:  Alert and orientedx3,normal mood and affect, good insight HEENT:  Normocephalic, atraumatic, non-icteric sclera, supple neck without adenopathy, mass or thyromegaly Cardiovascular:  Normal S1, S2, RRR without gallop, rub or murmur Respiratory:  Good breath sounds bilaterally, CTAB with normal respiratory effort Gastrointestinal: normal bowel sounds, soft, non-tender, no noted masses. No HSM MSK: no deformities, contusions. Joints are without erythema or swelling. Spine and CVA region are nontender Skin:  Warm, no rashes or suspicious lesions noted Neurologic:    Mental status is normal. CN 2-11 are normal. Gross motor and sensory exams are normal. Stable gait. No tremor GU: No inguinal hernias or adenopathy are appreciated bilaterally   Assessment  1. Annual physical exam   2. Type 2 diabetes mellitus without complication, without long-term current use of insulin ([place])   3. Hypertension associated with diabetes ([place])   4. Systolic congestive heart failure with reduced left ventricular function, NYHA class 2 ([place])   5. Frequent PVCs   6. Hyperlipidemia associated with type 2 diabetes mellitus ([facility])   7. Obesity, Class II, BMI 35-39.9   8. Adjustment reaction with anxiety and depression      Plan  Male Wellness Visit:  Age appropriate Health Maintenance and Prevention measures were discussed with patient. Included topics are cancer screening recommendations, ways to keep healthy (see AVS) including dietary and exercise recommendations, regular eye and dental care, use of seat belts, and avoidance of moderate alcohol  use and tobacco use.  BMI: discussed patient's BMI and encouraged positive lifestyle modifications to help get to or maintain a target BMI. HM needs and immunizations were addressed and ordered. See below for orders. See HM and immunization section for updates. Routine labs and screening tests ordered including cmp, cbc and lipids where appropriate. Discussed recommendations regarding Vit D and calcium supplementation (see AVS)  Chronic disease f/u and/or acute problem visit: (deemed necessary to be done in addition to the wellness visit): Hypertension and CHF: Well-controlled, continue current medications. Diabetes remains well controlled on Victoza.  Also for weight management and CHF. Frequent PVCs: Titrating off sotalol.  Hopefully this will help resolve this problem.  Counseling done Reactive anxiety and depression: Counseling done.  Start Wellbutrin 150 daily.  Avoid SSRIs given possible interaction with sotalol. Hyperlipidemia for recheck today.  Follow up: 6 weeks to follow-up mood [place] side effects, risks, benefits, and alternatives for medications and treatment plan prescribed today were discussed, and the patient expressed understanding of the given instructions. Patient is instructed to call or message via MyChart if he/she has any questions or concerns regarding our treatment plan. No barriers to understanding were identified. We discussed Red Flag symptoms and signs in detail. Patient expressed understanding regarding what to do in case of urgent or emergency type symptoms.  Medication list was reconciled, printed and provided to the patient in AVS. Patient instructions and summary information was reviewed with the patient as documented in the AVS. This note was prepared with assistance of Dragon voice recognition software. Occasional wrong-word or sound-a-like substitutions may have occurred due to the inherent limitations of voice recognition software  This visit occurred during  the SARS-CoV-2 public health emergency.  Safety protocols were in place, including screening questions prior to the visit, additional usage of staff PPE, and extensive cleaning of exam room while observing appropriate contact time as indicated for disinfecting solutions.   Orders Placed This Encounter  Procedures   CBC with Differential/Platelet   Comprehensive metabolic panel   Lipid panel   TSH   POCT HgB A1C   Meds ordered this encounter  Medications   buPROPion (WELLBUTRIN XL) 150 MG 24 hr tablet    Sig: Take 1 tablet (150 mg total) by mouth daily.    Dispense:  90 tablet    Refill:  1

## 2021-04-01 NOTE — Patient Instructions (Signed)
Please return in 6 weeks to recheck mood.   I will release your lab results to you on your MyChart account with further instructions. Please reply with any questions.    If you have any questions or concerns, please don't hesitate to send me a message via MyChart or call the office at [phone]. Thank you for visiting with [place] today! It's our pleasure caring for you.

## 2021-04-01 NOTE — Patient Instructions (Addendum)
1.  Reason for visit: EKG post  Sotalol increase  2.  Name of MD requesting visit:  Camnitz  3. H&P:  see epic  4.  ROS related to problem:  see epic  5.  Assessment and plan per MD:   EKG performed showing NSR, HR 62. Pt to continue Sotalol at 160 mg BID

## 2021-04-07 ENCOUNTER — Encounter: Payer: Self-pay | Admitting: Family Medicine

## 2021-04-09 ENCOUNTER — Other Ambulatory Visit: Payer: Self-pay

## 2021-04-09 ENCOUNTER — Encounter: Payer: Self-pay | Admitting: Internal Medicine

## 2021-04-09 ENCOUNTER — Ambulatory Visit (INDEPENDENT_AMBULATORY_CARE_PROVIDER_SITE_OTHER): Payer: 59 | Admitting: Internal Medicine

## 2021-04-09 VITALS — BP 98/58 | HR 70 | Ht 69.0 in | Wt 242.8 lb

## 2021-04-09 DIAGNOSIS — I428 Other cardiomyopathies: Secondary | ICD-10-CM

## 2021-04-09 DIAGNOSIS — I5020 Unspecified systolic (congestive) heart failure: Secondary | ICD-10-CM

## 2021-04-09 DIAGNOSIS — E119 Type 2 diabetes mellitus without complications: Secondary | ICD-10-CM | POA: Diagnosis not present

## 2021-04-09 DIAGNOSIS — I493 Ventricular premature depolarization: Secondary | ICD-10-CM | POA: Diagnosis not present

## 2021-04-09 MED ORDER — SOTALOL HCL 160 MG PO TABS: 160.0000 mg | ORAL_TABLET | Freq: Two times a day (BID) | ORAL | 3 refills | Status: DC

## 2021-04-09 NOTE — Progress Notes (Signed)
OFFICE CONSULT NOTE  Chief Complaint:  Follow-up heart failure  Primary Care Physician: Leamon Arnt, MD  HPI:  Gary Berry is a 50 y.o. male who is being seen today for the evaluation of heart failure at the request of Leamon Arnt, MD.  This is a pleasant 50 year old male whose wife is at [place].  He was referred for evaluation of newly recognized systolic congestive heart failure.  Apparently he had been having some palpitations which were evaluated by his PCP.  An echocardiogram was ordered as well as a Holter monitor.  He was found to have some PVCs on and off his EKG.  The echocardiogram was performed on 04/19/2020 and indicated an LVEF of 35 to 40% with global hypokinesis and moderate LVH, grade 1 diastolic dysfunction and moderate RV systolic dysfunction.  Pressure and IVC were normal with moderate left atrial enlargement.  Despite these findings, Gary Berry has no heart failure symptoms.  He denies worsening shortness of breath, orthopnea, lower extremity edema, chest pain, presyncope, syncopal episodes or any other associated symptoms.  He has had palpitations as mentioned and was noted to have PVCs.  The Holter monitor result is not yet available to determine his burden of PVCs.  According to his wife he also has a history of some alcohol use and was drinking about 3-4 drinks a night which had picked up since the beginning of the Covid pandemic.  Apparently before that it was much less.  He has recently decreased this.  In addition he was described as a very heavy caffeine user and recently has been decreasing that as well.  There is heart disease in his family including his father who had heart disease in his 62s.  He also has type 2 diabetes without insulin use, hypertension and mild obesity.  Recent hemoglobin A1c was 5.3.  Direct LDL in October was 69, however triglycerides were elevated at 273.  06/28/2020  Gary Berry returns today for follow-up.  Overall he  continues to feel well.  He is generally unaware of his heart failure or PVCs.  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  There is also a right bundle branch block which has been stable even prior to starting flecainide.  QTc is 431 ms.  Repeat lab work on [place] showed normal creatinine and blood pressure is excellent at 118/78.  09/27/2020  Gary Berry returns today for follow-up. Overall he is continuing to feel well. In fact he says he is never felt unwell. He saw Dr. Curt Bears who put him on mexiletine. He seems to be tolerating this and has had improvement in his PVCs. I did not detect any on his EKG today. He had an echo earlier this morning which had not been formally read. My review suggest that there is improvement in LV function possibly up to 45 to 50%. The suggest that overall he seems to be responding to therapy. I think there is still blood pressure room to consider up titrating his Entresto.  04/09/2021  Gary Berry is seen today in follow-up.  He underwent PVC ablation in April 2022 after minimal improvement on antiarrhythmic therapy with mexiletine.  This was partially successful however the focus of his PVCs was very near the right coronary artery ostium.  This limited the extent of ablation possible.  Subsequently he has  had persistent and significant PVCs.  He was placed on sotalol and increased up to 160 mg twice daily.  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  Previous echo in December showed some improvement in LVEF up to 40 to 45%.  Blood pressure is low today however and will not allow further up titration of his Entresto.  He is on Victoza however has a low A1c of 5.4.  It might be reasonable to consider switching him from Victoza to an SGLT2 inhibitor such as [place] or [place].  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   Hyperlipidemia    Hyperlipidemia associated with type 2 diabetes mellitus ([place]) 11/21/2016   Hypertension associated with diabetes ([place]) 11/21/2016   Obesity, Class II, BMI 35-39.9 12/22/1935   Systolic congestive heart failure with reduced left ventricular function, NYHA class 2 ([place]) 04/24/2020   Echocardiogram 04/2020:  Ef 35-40%, global hypokinesis   Type 2 diabetes mellitus without complication, without long-term current use of insulin ([place]) 11/21/2016    Past Surgical History:  Procedure Laterality Date   HERNIA REPAIR  2016   hard to wake up after sx   PVC ABLATION N/A 02/14/2021   Procedure: PVC ABLATION;  Surgeon: Constance Haw, MD;  Location: [facility];  Service: Cardiovascular;  Laterality: N/A;   RIGHT/LEFT HEART CATH AND CORONARY ANGIOGRAPHY N/A 05/04/2020   Procedure: RIGHT/LEFT HEART CATH AND CORONARY ANGIOGRAPHY;  Surgeon: Leonie Man, MD;  Location: [facility];  Service: Cardiovascular;  Laterality: N/A;   TONSILLECTOMY  1975   VASECTOMY      FAMHx:  Family History  Problem Relation Age of Onset   Diabetes Mother    Hypertension Mother    Atrial fibrillation Father    Colon polyps Father    Colon cancer Neg Hx    Esophageal cancer Neg Hx    Rectal cancer Neg Hx    Stomach cancer Neg Hx     SOCHx:   reports that he has never smoked. He quit smokeless tobacco use about 23 years ago.  His smokeless tobacco use included chew. He reports current alcohol use of about 3.0 standard drinks of alcohol per week. He reports that he does not use drugs.  ALLERGIES:  No Known Allergies  ROS: Pertinent items noted in HPI and remainder of comprehensive ROS otherwise negative.  HOME MEDS: Current Outpatient Medications on File Prior to Visit  Medication Sig Dispense Refill   aspirin EC 81 MG tablet  Take 81 mg by mouth daily. Swallow whole.     B-D UF III MINI PEN NEEDLES 31G X 5 MM MISC See admin instructions.     buPROPion (WELLBUTRIN XL) 150 MG 24 hr tablet Take 1 tablet (150 mg total) by mouth daily. 90 tablet 1   cetirizine (ZYRTEC) 10 MG tablet Take 10 mg by mouth daily as needed for allergies.     Cholecalciferol (VITAMIN D) 125 MCG (5000 UT) CAPS Take 5,000 Units by mouth daily.      Cyanocobalamin (VITAMIN B12) 1000 MCG TBCR Take 1,000 mcg by mouth daily.      ibuprofen (ADVIL) 200 MG tablet Take 400 mg by mouth every 6 (six) hours as needed for headache or moderate pain.     liraglutide (VICTOZA) 18 MG/3ML SOPN Inject 1.8 mg into the skin every morning.  naproxen sodium (ALEVE) 220 MG tablet Take 220 mg by mouth daily as needed (pain).     Omega-3 Fatty Acids (FISH OIL) 1000 MG CAPS Take 2,000 mg by mouth daily.     rosuvastatin (CRESTOR) 20 MG tablet Take 20 mg by mouth daily.     sacubitril-valsartan (ENTRESTO) 49-51 MG Take 1 tablet by mouth 2 (two) times daily. 180 tablet 3   sotalol (BETAPACE) 160 MG tablet Take 2 tablets (320 mg total) by mouth 2 (two) times daily. (Patient taking differently: Take 160 mg by mouth 2 (two) times daily.) 60 tablet 3   tadalafil (CIALIS) 5 MG tablet TAKE 1 TABLET BY MOUTH DAILY AS NEEDED FOR ERECTILE DYSFUNCTION (Patient taking differently: Take 5 mg by mouth daily as needed for erectile dysfunction. TAKE 1 TABLET BY MOUTH DAILY AS NEEDED FOR ERECTILE DYSFUNCTION) 90 tablet 1   No current facility-administered medications on file prior to visit.    LABS/IMAGING: No results found for this or any previous visit (from the past 48 hour(s)). No results found.  LIPID PANEL:    Component Value Date/Time   CHOL 159 04/01/2021 0900   TRIG 139.0 04/01/2021 0900   HDL 44.90 04/01/2021 0900   CHOLHDL 4 04/01/2021 0900   VLDL 27.8 04/01/2021 0900   LDLCALC 86 04/01/2021 0900   LDLCALC 73 07/19/2020 0905   LDLDIRECT 69.0 08/02/2019 0919     WEIGHTS: Wt Readings from Last 3 Encounters:  04/09/21 242 lb 12.8 oz (110.1 kg)  04/01/21 244 lb 9.6 oz (110.9 kg)  03/05/21 245 lb (111.1 kg)    VITALS: BP (!) 98/58 (BP Location: Left Arm, Patient Position: Sitting, Cuff Size: Large)   Pulse 70   Ht 5\' 9"  (1.753 m)   Wt 242 lb 12.8 oz (110.1 kg)   SpO2 96%   BMI 35.86 kg/m   EXAM: General appearance: alert, no distress and mildly obese Neck: no carotid bruit, no JVD and thyroid not enlarged, symmetric, no tenderness/mass/nodules Lungs: clear to auscultation bilaterally Heart: regular rate and rhythm, S1, S2 normal, no murmur, click, rub or gallop Abdomen: soft, non-tender; bowel sounds normal; no masses,  no organomegaly Extremities: extremities normal, atraumatic, no cyanosis or edema Pulses: 2+ and symmetric Skin: Skin color, texture, turgor normal. 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   Gary Berry continues to have PVCs that are quite frequent despite recent PVC ablation and now on sotalol 160 mg twice daily.  He has NYHA class I symptoms.  His last EF was 40 to 45%.  As its been over 6 months I like to repeat another echo on therapy with Entresto.  He has follow-up with EP next month.  I also like to see about starting him on either Jardiance or Farxiga for heart failure benefit and lieu of his Victoza.  Since this is managed by his PCP I will defer that to her.  He has an upcoming visit next month.  Follow-up with me in 6 months  Pixie Casino, MD, [facility], [place] Director of  the Advanced Lipid Disorders &  Cardiovascular Risk Reduction Clinic Diplomate of the American Board  of Clinical Lipidology Attending Cardiologist  Direct Dial: [phone]  Fax: [phone]  Website:  [email or web address] 04/09/2021, 8:31 AM

## 2021-04-09 NOTE — Patient Instructions (Signed)
Medication Instructions:  No Changes In Medications at this time.  *If you need a refill on your cardiac medications before your next appointment, please call your pharmacy*  Testing/Procedures: Your physician has requested that you have an echocardiogram. Echocardiography is a painless test that uses sound waves to create images of your heart. It provides your doctor with information about the size and shape of your heart and how well your heart's chambers and valves are working. You may receive an ultrasound enhancing agent through an IV if needed to better visualize your heart during the echo.This procedure takes approximately one hour. There are no restrictions for this procedure. This will take place at the [address], [address].   Follow-Up: At [facility], you and your health needs are our priority.  As part of our continuing mission to provide you with exceptional heart care, we have created designated Provider Care Teams.  These Care Teams include your primary Cardiologist (physician) and Advanced Practice Providers (APPs -  Physician Assistants and Nurse Practitioners) who all work together to provide you with the care you need, when you need it.  Your next appointment:   6 month(s)  The format for your next appointment:   In Person  Provider:   K. Mali Hilty, MD

## 2021-04-12 ENCOUNTER — Other Ambulatory Visit: Payer: Self-pay | Admitting: Family Medicine

## 2021-04-12 DIAGNOSIS — N529 Male erectile dysfunction, unspecified: Secondary | ICD-10-CM

## 2021-04-23 ENCOUNTER — Other Ambulatory Visit: Payer: Self-pay | Admitting: Internal Medicine

## 2021-04-29 ENCOUNTER — Other Ambulatory Visit: Payer: Self-pay

## 2021-04-29 ENCOUNTER — Ambulatory Visit (HOSPITAL_COMMUNITY): Payer: 59 | Attending: Cardiovascular Disease

## 2021-04-29 DIAGNOSIS — I5020 Unspecified systolic (congestive) heart failure: Secondary | ICD-10-CM | POA: Insufficient documentation

## 2021-04-29 LAB — ECHOCARDIOGRAM COMPLETE
Area-P 1/2: 3.12 cm2
S' Lateral: 3.5 cm

## 2021-05-06 ENCOUNTER — Other Ambulatory Visit (HOSPITAL_COMMUNITY): Payer: Self-pay

## 2021-05-06 MED ORDER — CARESTART COVID-19 HOME TEST VI KIT
PACK | 0 refills | Status: DC
  Filled 2021-05-06: qty 4, 4d supply, fill #0

## 2021-05-13 NOTE — Progress Notes (Signed)
PCP:  Leamon Arnt, MD Primary Cardiologist: None Electrophysiologist: Will Meredith Leeds, MD   Gary Berry is a 50 y.o. male seen today for Will Meredith Leeds, MD for routine electrophysiology followup.  Since last being seen in our clinic the patient reports doing well overall.  he denies chest pain, palpitations, dyspnea, PND, orthopnea, nausea, vomiting, dizziness, syncope, edema, weight gain, or early satiety.  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   Hyperlipidemia    Hyperlipidemia associated with type 2 diabetes mellitus ([place]) 11/21/2016   Hypertension associated with diabetes ([place]) 11/21/2016   Obesity, Class II, BMI 35-39.9 4/0/0867   Systolic congestive heart failure with reduced left ventricular function, NYHA class 2 ([place]) 04/24/2020   Echocardiogram 04/2020:  Ef 35-40%, global hypokinesis   Type 2 diabetes mellitus without complication, without long-term current use of insulin ([place]) 11/21/2016   Past Surgical History:  Procedure Laterality Date   HERNIA REPAIR  2016   hard to wake up after sx   PVC ABLATION N/A 02/14/2021   Procedure: PVC ABLATION;  Surgeon: Constance Haw, MD;  Location: [facility];  Service: Cardiovascular;  Laterality: N/A;   RIGHT/LEFT HEART CATH AND CORONARY ANGIOGRAPHY N/A 05/04/2020   Procedure: RIGHT/LEFT HEART CATH AND CORONARY ANGIOGRAPHY;  Surgeon: Leonie Man, MD;  Location: [facility];  Service: Cardiovascular;  Laterality: N/A;   TONSILLECTOMY  1975   VASECTOMY      Current Outpatient Medications  Medication Sig Dispense Refill   aspirin EC 81 MG tablet Take 81 mg by mouth daily. Swallow whole.     B-D UF III MINI PEN NEEDLES 31G X 5 MM MISC See admin instructions.     buPROPion (WELLBUTRIN XL) 150 MG 24 hr tablet Take 1 tablet (150 mg total) by mouth daily. 90 tablet 1   cetirizine (ZYRTEC) 10  MG tablet Take 10 mg by mouth daily as needed for allergies.     Cholecalciferol (VITAMIN D) 125 MCG (5000 UT) CAPS Take 5,000 Units by mouth daily.      COVID-19 At Home Antigen Test [facility] COVID-19 HOME TEST) KIT Take as directed 4 each 0   Cyanocobalamin (VITAMIN B12) 1000 MCG TBCR Take 1,000 mcg by mouth daily.      ibuprofen (ADVIL) 200 MG tablet Take 400 mg by mouth every 6 (six) hours as needed for headache or moderate pain.     liraglutide (VICTOZA) 18 MG/3ML SOPN Inject 1.8 mg into the skin every morning.     naproxen sodium (ALEVE) 220 MG tablet Take 220 mg by mouth daily as needed (pain).     Omega-3 Fatty Acids (FISH OIL) 1000 MG CAPS Take 2,000 mg by mouth daily.     rosuvastatin (CRESTOR) 20 MG tablet Take 20 mg by mouth daily.     sacubitril-valsartan (ENTRESTO) 49-51 MG Take 1 tablet by mouth 2 (two) times daily. 180 tablet 3   sotalol (BETAPACE) 160 MG tablet Take 1 tablet (160 mg total) by mouth 2 (two) times daily. 60 tablet 3   tadalafil (CIALIS) 5 MG tablet TAKE 1 TABLET BY MOUTH DAILY AS NEEDED FOR ERECTILE DYSFUNCTION. 90 tablet 1   No current facility-administered medications for this visit.    No Known Allergies  Social History   Socioeconomic History   Marital status: Married    Spouse name: Dennard Nip, MD   Number  of children: 1   Years of education: BA   Highest education level: Not on file  Occupational History   Occupation: [employer]    Employer: [employer]  Tobacco Use   Smoking status: Never   Smokeless tobacco: Former    Types: Chew    Quit date: 11/21/1997  Vaping Use   Vaping Use: Never used  Substance and Sexual Activity   Alcohol use: Yes    Alcohol/week: 3.0 standard drinks    Types: 3 Standard drinks or equivalent per week    Comment: social   Drug use: No   Sexual activity: Yes    Partners: Female  Other Topics Concern   Not on file  Social History Narrative   Caffeine use: 2 cups coffee per day   Caffeine free soda      Right  handed    Social Determinants of Health   Financial Resource Strain: Not on file  Food Insecurity: Not on file  Transportation Needs: Not on file  Physical Activity: Not on file  Stress: Not on file  Social Connections: Not on file  Intimate Partner Violence: Not on file     Review of Systems: General: No chills, fever, night sweats or weight changes  Cardiovascular:  No chest pain, dyspnea on exertion, edema, orthopnea, palpitations, paroxysmal nocturnal dyspnea Dermatological: No rash, lesions or masses Respiratory: No cough, dyspnea Urologic: No hematuria, dysuria Abdominal: No nausea, vomiting, diarrhea, bright red blood per rectum, melena, or hematemesis Neurologic: No visual changes, weakness, changes in mental status All other systems reviewed and are otherwise negative except as noted above.  Physical Exam: There were no vitals filed for this visit.  GEN- The patient is well appearing, alert and oriented x 3 today.   HEENT: normocephalic, atraumatic; sclera clear, conjunctiva pink; hearing intact; oropharynx clear; neck supple, no JVP Lymph- no cervical lymphadenopathy Lungs- Clear to ausculation bilaterally, normal work of breathing.  No wheezes, rales, rhonchi Heart- Regular rate and rhythm, no murmurs, rubs or gallops, PMI not laterally displaced GI- soft, non-tender, non-distended, bowel sounds present, no hepatosplenomegaly Extremities- no clubbing, cyanosis, or edema; DP/PT/radial pulses 2+ bilaterally MS- no significant deformity or atrophy Skin- warm and dry, no rash or lesion Psych- euthymic mood, full affect Neuro- strength and sensation are intact  EKG is ordered. Personal review of EKG from today shows NSR with frequent PVCs  Additional studies reviewed include: Previous EP office notes  Monitor 06/2020 PVC burden 14% on flecainide  Monitor 10/2020 PVC burden 16.6% on mexiletine  Echo 09/2020  LVEF 40-45%  Monitor 02/2021 With PVC burden of 26%.  Sotalol was increased.  Echo 04/2021 LVEF 35-40%  Assessment and Plan:  1.  PVCs:  Had an attempted PVC ablation on 02/14/2021, unfortunately they were too close to the RCA.  Normal coronary arteries by recent catheterization.   Sotalol increased 04/09/21.  Of note, his PVC burden is higher on sotalol than it was on either flecainide or mexiletine. We will thus use sotalol and mexiletine together after discussing with Dr. Curt Bears and Pharmacy.      2.  Nonischemic cardiomyopathy: Potentially Toprol XL and Entresto per primary cardiology.   Echo 04/29/2021 with LVEF still in 35-40% range.  Continue Entresto and sotalol.   3.  Hypertension:  Continue current medications.   Add mexitil 250 mg BID. Wait 10 days and wear zio patch to re-assess PVC burden. Follow up in clinic 2-3 weeks for close assessment of EKG with med changes. Labs today.  Shirley Friar, PA-C  05/13/21 2:14 PM

## 2021-05-14 ENCOUNTER — Other Ambulatory Visit: Payer: Self-pay

## 2021-05-14 ENCOUNTER — Ambulatory Visit (INDEPENDENT_AMBULATORY_CARE_PROVIDER_SITE_OTHER): Payer: 59

## 2021-05-14 ENCOUNTER — Encounter: Payer: Self-pay | Admitting: Student

## 2021-05-14 ENCOUNTER — Ambulatory Visit (INDEPENDENT_AMBULATORY_CARE_PROVIDER_SITE_OTHER): Payer: 59 | Admitting: Student

## 2021-05-14 ENCOUNTER — Ambulatory Visit: Payer: 59 | Admitting: Physician Assistant

## 2021-05-14 ENCOUNTER — Other Ambulatory Visit: Payer: Self-pay | Admitting: *Deleted

## 2021-05-14 VITALS — BP 100/78 | HR 73 | Ht 69.0 in | Wt 245.0 lb

## 2021-05-14 DIAGNOSIS — I10 Essential (primary) hypertension: Secondary | ICD-10-CM | POA: Diagnosis not present

## 2021-05-14 DIAGNOSIS — I493 Ventricular premature depolarization: Secondary | ICD-10-CM | POA: Diagnosis not present

## 2021-05-14 DIAGNOSIS — I428 Other cardiomyopathies: Secondary | ICD-10-CM

## 2021-05-14 LAB — BASIC METABOLIC PANEL
BUN/Creatinine Ratio: 13 (ref 9–20)
BUN: 13 mg/dL (ref 6–24)
CO2: 25 mmol/L (ref 20–29)
Calcium: 9.9 mg/dL (ref 8.7–10.2)
Chloride: 105 mmol/L (ref 96–106)
Creatinine, Ser: 1.01 mg/dL (ref 0.76–1.27)
Glucose: 90 mg/dL (ref 65–99)
Potassium: 4.7 mmol/L (ref 3.5–5.2)
Sodium: 142 mmol/L (ref 134–144)
eGFR: 89 mL/min/{1.73_m2} (ref >59)

## 2021-05-14 LAB — MAGNESIUM: Magnesium: 2 mg/dL (ref 1.6–2.3)

## 2021-05-14 MED ORDER — ROSUVASTATIN CALCIUM 20 MG PO TABS: 20.0000 mg | ORAL_TABLET | Freq: Every day | ORAL | 1 refills | Status: DC

## 2021-05-14 MED ORDER — MEXILETINE HCL 250 MG PO CAPS
250.0000 mg | ORAL_CAPSULE | Freq: Two times a day (BID) | ORAL | 3 refills | Status: DC
Start: 2021-05-14 — End: 2022-05-12

## 2021-05-14 NOTE — Progress Notes (Unsigned)
Patient enrolled for [employer] to mail a  3 day ZIO XT monitor to his address on file.

## 2021-05-14 NOTE — Patient Instructions (Signed)
Medication Instructions:  Your physician has recommended you make the following change in your medication:   START: Mexilitine '250mg'$  two times daily  *If you need a refill on your cardiac medications before your next appointment, please call your pharmacy*   Lab Work: TODAY: BMET, Mg  If you have labs (blood work) drawn today and your tests are completely normal, you will receive your results only by: [place] (if you have MyChart) OR A paper copy in the mail If you have any lab test that is abnormal or we need to change your treatment, we will call you to review the results.   Follow-Up: At [facility], you and your health needs are our priority.  As part of our continuing mission to provide you with exceptional heart care, we have created designated Provider Care Teams.  These Care Teams include your primary Cardiologist (physician) and Advanced Practice Providers (APPs -  Physician Assistants and Nurse Practitioners) who all work together to provide you with the care you need, when you need it.   Your next appointment:   As scheduled  Other Instructions  **START THE MONITOR 10 DAYS AFTER STARTING THE NEW MEDICATION**   ZIO XT- Long Term Monitor Instructions  Your physician has requested you wear a ZIO patch monitor for 3 days.  This is a single patch monitor. [employer] supplies one patch monitor per enrollment. Additional stickers are not available. Please do not apply patch if you will be having a Nuclear Stress Test,  Echocardiogram, Cardiac CT, MRI, or Chest Xray during the period you would be wearing the  monitor. The patch cannot be worn during these tests. You cannot remove and re-apply the  ZIO XT patch monitor.  Your ZIO patch monitor will be mailed 3 day USPS to your address on file. It may take 3-5 days  to receive your monitor after you have been enrolled.  Once you have received your monitor, please review the enclosed instructions. Your monitor  has  already been registered assigning a specific monitor serial # to you.  Billing and Patient Assistance Program Information  We have supplied [employer] with any of your insurance information on file for billing purposes. [employer] offers a sliding scale Patient Assistance Program for patients that do not have  insurance, or whose insurance does not completely cover the cost of the ZIO monitor.  You must apply for the Patient Assistance Program to qualify for this discounted rate.  To apply, please call [employer] at [phone], select option 4, select option 2, ask to apply for  Patient Assistance Program. Theodore Demark will ask your household income, and how many people  are in your household. They will quote your out-of-pocket cost based on that information.  [employer] will also be able to set up a 4-month interest-free payment plan if needed.  Applying the monitor   Shave hair from upper left chest.  Hold abrader disc by orange tab. Rub abrader in 40 strokes over the upper left chest as  indicated in your monitor instructions.  Clean area with 4 enclosed alcohol pads. Let dry.  Apply patch as indicated in monitor instructions. Patch will be placed under collarbone on left  side of chest with arrow pointing upward.  Rub patch adhesive wings for 2 minutes. Remove white label marked "1". Remove the white  label marked "2". Rub patch adhesive wings for 2 additional minutes.  While looking in a mirror, press and release button in center of patch. A small green light will  flash 3-4 times. This will be your only indicator that the monitor has been turned on.  Do not shower for the first 24 hours. You may shower after the first 24 hours.  Press the button if you feel a symptom. You will hear a small click. Record Date, Time and  Symptom in the Patient Logbook.  When you are ready to remove the patch, follow instructions on the last 2 pages of Patient  Logbook. Stick patch monitor onto the last page of  Patient Logbook.  Place Patient Logbook in the blue and white box. Use locking tab on box and tape box closed  securely. The blue and white box has prepaid postage on it. Please place it in the mailbox as  soon as possible. Your physician should have your test results approximately 7 days after the  monitor has been mailed back to [facility].  Call [place] at [phone] if you have questions regarding  your ZIO XT patch monitor. Call them immediately if you see an orange light blinking on your  monitor.  If your monitor falls off in less than 4 days, contact our Monitor department at [phone].  If your monitor becomes loose or falls off after 4 days call [employer] at [phone] for  suggestions on securing your monitor

## 2021-05-17 ENCOUNTER — Encounter: Payer: Self-pay | Admitting: Family Medicine

## 2021-05-17 ENCOUNTER — Other Ambulatory Visit: Payer: Self-pay

## 2021-05-17 ENCOUNTER — Ambulatory Visit (INDEPENDENT_AMBULATORY_CARE_PROVIDER_SITE_OTHER): Payer: 59 | Admitting: Family Medicine

## 2021-05-17 VITALS — BP 121/72 | HR 77 | Temp 97.2°F | Wt 254.4 lb

## 2021-05-17 DIAGNOSIS — I5020 Unspecified systolic (congestive) heart failure: Secondary | ICD-10-CM

## 2021-05-17 DIAGNOSIS — E119 Type 2 diabetes mellitus without complications: Secondary | ICD-10-CM

## 2021-05-17 DIAGNOSIS — F4323 Adjustment disorder with mixed anxiety and depressed mood: Secondary | ICD-10-CM

## 2021-05-17 DIAGNOSIS — E669 Obesity, unspecified: Secondary | ICD-10-CM | POA: Diagnosis not present

## 2021-05-17 DIAGNOSIS — I493 Ventricular premature depolarization: Secondary | ICD-10-CM | POA: Diagnosis not present

## 2021-05-17 MED ORDER — DAPAGLIFLOZIN PROPANEDIOL 5 MG PO TABS: 5.0000 mg | ORAL_TABLET | Freq: Every day | ORAL | 11 refills | Status: DC

## 2021-05-17 NOTE — Patient Instructions (Signed)
Please return in 6 months for sugar and weight and mood recheck.   Stop the victoza and start the [place]. Let me know if you have problems.   If you have any questions or concerns, please don't hesitate to send me a message via MyChart or call the office at [phone]. Thank you for visiting with [place] today! It's our pleasure caring for you.

## 2021-05-17 NOTE — Progress Notes (Signed)
Subjective  CC:  Chief Complaint  Patient presents with   Depression    Started Wellbutrin 150 mg last visit, has noticed improvement in mood   Anxiety    HPI: Gary Berry is a 50 y.o. male who presents to the office today to address the problems listed above in the chief complaint, mood problems. Adjustment depression anxiety follow-up: We started Wellbutrin 6 weeks ago.  Please see last note.  Fortunately, he has had a good response.  He has noticed much less irritability and reactivity.  This is made him feel less anxious.  His mood remains mildly low in part due to dealing with persistent systolic heart failure and heavy PVC burden which the cardiologist are trying to suppress with medications.  I reviewed the recent cardiology notes.  No adverse effects from the Wellbutrin.  He definitely feels better.  He notes his wife has noticed an improvement as well.  This is his first time use with an antidepressant.  No history of mood disorder.  Depression screen [facility] 2/9 05/17/2021 09/28/2020 07/19/2020  Decreased Interest 0 0 0  Down, Depressed, Hopeless 0 0 0  PHQ - 2 Score 0 0 0  Altered sleeping 0 - -  Tired, decreased energy 0 - -  Change in appetite - - -  Feeling bad or failure about yourself  0 - -  Trouble concentrating 0 - -  Moving slowly or fidgety/restless 0 - -  Suicidal thoughts 0 - -  PHQ-9 Score 0 - -  Difficult doing work/chores - - -   GAD 7 : Generalized Anxiety Score 05/17/2021  Nervous, Anxious, on Edge 0  Control/stop worrying 0  Worry too much - different things 0  Trouble relaxing 0  Restless 0  Easily annoyed or irritable 0  Afraid - awful might happen 0  Total GAD 7 Score 0    Assessment  1. Adjustment reaction with anxiety and depression   2. Systolic congestive heart failure with reduced left ventricular function, NYHA class 2 ([place])   3. Frequent PVCs   4. Obesity, Class II, BMI 35-39.9   5. Type 2 diabetes mellitus without complication, without  long-term current use of insulin (HCC)    Heart failure with frequent PVCs: We had discussed the use of an SGLT2 inhibitor for heart failure benefits in the past.  After healthy also is considering this.  He has a history of diabetes and obesity, he uses Victoza daily more for weight loss than anything.  He fears if he stops this his appetite will return and he will gain weight.  His most recent A1c is 5.4.  Fortunately, he remains asymptomatic with his heart failure and PVCs.  He is on multiple cardiac medications reviewed today.   Plan  Reactive anxiety and depression: Good response to Wellbutrin XR 150 mg daily.  Further counseling done.  We will continue current dose for the next 6 to 12 months.  Monitor symptoms. Heart failure: Agree that an SGLT2 inhibitor would be beneficial.  I favor switching to [place] and stopping Victoza.  Again patient is worried about his weight.  We will monitor this closely.  Farxiga 5 mg daily ordered.  Can titrate up to 10 if tolerates. History of diabetes: Monitor A1c's Obesity: Recommend heart healthy diet.  Monitor weight.  Can restart Victoza if needed.  Patient understands and agrees with care plan   Follow up: 6 months to recheck weight, sugars and mood No orders of the defined types  were placed in this encounter.  Meds ordered this encounter  Medications   dapagliflozin propanediol (FARXIGA) 5 MG TABS tablet    Sig: Take 1 tablet (5 mg total) by mouth daily before breakfast.    Dispense:  30 tablet    Refill:  11       I reviewed the patients updated PMH, FH, and SocHx.    Patient Active Problem List   Diagnosis Date Noted   PVC (premature ventricular contraction) 02/14/2021   Frequent PVCs 05/04/2020   Systolic congestive heart failure with reduced left ventricular function, NYHA class 2 (HCC) 04/24/2020   Type 2 diabetes mellitus without complication, without long-term current use of insulin (HCC) 11/21/2016   Hypertension associated with  diabetes (HCC) 11/21/2016   Hyperlipidemia associated with type 2 diabetes mellitus (HCC) 11/21/2016   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   Obesity, Class II, BMI 35-39.9 11/21/2016   Umbilical hernia 06/23/2014   Current Meds  Medication Sig   aspirin EC 81 MG tablet Take 81 mg by mouth daily. Swallow whole.   B-D UF III MINI PEN NEEDLES 31G X 5 MM MISC See admin instructions.   buPROPion (WELLBUTRIN XL) 150 MG 24 hr tablet Take 1 tablet (150 mg total) by mouth daily.   cetirizine (ZYRTEC) 10 MG tablet Take 10 mg by mouth daily as needed for allergies.   Cholecalciferol (VITAMIN D) 125 MCG (5000 UT) CAPS Take 5,000 Units by mouth daily.    Cyanocobalamin (VITAMIN B12) 1000 MCG TBCR Take 1,000 mcg by mouth daily.    dapagliflozin propanediol (FARXIGA) 5 MG TABS tablet Take 1 tablet (5 mg total) by mouth daily before breakfast.   ibuprofen (ADVIL) 200 MG tablet Take 400 mg by mouth every 6 (six) hours as needed for headache or moderate pain.   liraglutide (VICTOZA) 18 MG/3ML SOPN Inject 1.8 mg into the skin every morning.   mexiletine (MEXITIL) 250 MG capsule Take 1 capsule (250 mg total) by mouth 2 (two) times daily.   naproxen sodium (ALEVE) 220 MG tablet Take 220 mg by mouth daily as needed (pain).   Omega-3 Fatty Acids (FISH OIL) 1000 MG CAPS Take 2,000 mg by mouth daily.   rosuvastatin (CRESTOR) 20 MG tablet Take 1 tablet (20 mg total) by mouth daily.   sacubitril-valsartan (ENTRESTO) 49-51 MG Take 1 tablet by mouth 2 (two) times daily.   sotalol (BETAPACE) 160 MG tablet Take 1 tablet (160 mg total) by mouth 2 (two) times daily.   tadalafil (CIALIS) 5 MG tablet TAKE 1 TABLET BY MOUTH DAILY AS NEEDED FOR ERECTILE DYSFUNCTION.   [DISCONTINUED] COVID-19 At Home Antigen Test [facility] COVID-19 HOME TEST) KIT Take as directed    Allergies: Patient has No Known Allergies. Family history:  Patient family history includes Atrial fibrillation in his father; Colon polyps  in his father; Diabetes in his mother; Hypertension in his mother. Social History   Socioeconomic History   Marital status: Married    Spouse name: Quillian Quince, MD   Number of children: 1   Years of education: BA   Highest education level: Not on file  Occupational History   Occupation: [employer]    Employer: [employer]  Tobacco Use   Smoking status: Never   Smokeless tobacco: Former    Types: Chew    Quit date: 11/21/1997  Vaping Use   Vaping Use: Never used  Substance and Sexual Activity   Alcohol use: Yes    Alcohol/week: 3.0 standard drinks  Types: 3 Standard drinks or equivalent per week    Comment: social   Drug use: No   Sexual activity: Yes    Partners: Female  Other Topics Concern   Not on file  Social History Narrative   Caffeine use: 2 cups coffee per day   Caffeine free soda      Right handed    Social Determinants of Health   Financial Resource Strain: Not on file  Food Insecurity: Not on file  Transportation Needs: Not on file  Physical Activity: Not on file  Stress: Not on file  Social Connections: Not on file     Review of Systems: Constitutional: Negative for fever malaise or anorexia Cardiovascular: negative for chest pain Respiratory: negative for SOB or persistent cough Gastrointestinal: negative for abdominal pain  Objective  Vitals: BP 121/72   Pulse 77   Temp (!) 97.2 F (36.2 C) (Temporal)   Wt 254 lb 6.4 oz (115.4 kg)   SpO2 99%   BMI 37.57 kg/m  General: no acute distress, well appearing, no apparent distress, well groomed Psych:  Alert and oriented x 3,normal mood, behavior, speech, dress, and thought processes.  Appears brighter today    Commons side effects, risks, benefits, and alternatives for medications and treatment plan prescribed today were discussed, and the patient expressed understanding of the given instructions. Patient is instructed to call or message via MyChart if he/she has any questions or concerns regarding  our treatment plan. No barriers to understanding were identified. We discussed Red Flag symptoms and signs in detail. Patient expressed understanding regarding what to do in case of urgent or emergency type symptoms.  Medication list was reconciled, printed and provided to the patient in AVS. Patient instructions and summary information was reviewed with the patient as documented in the AVS. This note was prepared with assistance of Dragon voice recognition software. Occasional wrong-word or sound-a-like substitutions may have occurred due to the inherent limitations of voice recognition software

## 2021-05-27 DIAGNOSIS — I493 Ventricular premature depolarization: Secondary | ICD-10-CM | POA: Diagnosis not present

## 2021-06-02 NOTE — Progress Notes (Signed)
PCP:  Leamon Arnt, MD Primary Cardiologist: None Electrophysiologist: Will Meredith Leeds, MD   Gary Berry is a 50 y.o. male seen today for Will Meredith Leeds, MD for routine electrophysiology followup.  Since last being seen in our clinic the patient reports doing very well.  he denies chest pain, palpitations, dyspnea, PND, orthopnea, nausea, vomiting, dizziness, syncope, edema, weight gain, or early satiety.  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, NYHA class 2 ([place]) 04/24/2020   Echocardiogram 04/2020:  Ef 35-40%, global hypokinesis   Type 2 diabetes mellitus without complication, without long-term current use of insulin ([place]) 11/21/2016   Past Surgical History:  Procedure Laterality Date   HERNIA REPAIR  2016   hard to wake up after sx   PVC ABLATION N/A 02/14/2021   Procedure: PVC ABLATION;  Surgeon: Constance Haw, MD;  Location: [place] [facility];  Service: Cardiovascular;  Laterality: N/A;   RIGHT/LEFT HEART CATH AND CORONARY ANGIOGRAPHY N/A 05/04/2020   Procedure: RIGHT/LEFT HEART CATH AND CORONARY ANGIOGRAPHY;  Surgeon: Leonie Man, MD;  Location: [place] [facility];  Service: Cardiovascular;  Laterality: N/A;   TONSILLECTOMY  1975   VASECTOMY      Current Outpatient Medications  Medication Sig Dispense Refill   aspirin EC 81 MG tablet Take 81 mg by mouth daily. Swallow whole.     cetirizine (ZYRTEC) 10 MG tablet Take 10 mg by mouth daily as needed for allergies.     Cholecalciferol (VITAMIN D) 125 MCG (5000 UT) CAPS Take 5,000 Units by mouth daily.      Cyanocobalamin (VITAMIN  B12) 1000 MCG TBCR Take 1,000 mcg by mouth daily.      dapagliflozin propanediol (FARXIGA) 5 MG TABS tablet Take 1 tablet (5 mg total) by mouth daily before breakfast. 30 tablet 11   ibuprofen (ADVIL) 200 MG tablet Take 400 mg by mouth every 6 (six) hours as needed for headache or moderate pain.     mexiletine (MEXITIL) 250 MG capsule Take 1 capsule (250 mg total) by mouth 2 (two) times daily. 180 capsule 3   naproxen sodium (ALEVE) 220 MG tablet Take 220 mg by mouth daily as needed (pain).     Omega-3 Fatty Acids (FISH OIL) 1000 MG CAPS Take 2,000 mg by mouth daily.     rosuvastatin (CRESTOR) 20 MG tablet Take 1 tablet (20 mg total) by mouth daily. 90 tablet 1   sacubitril-valsartan (ENTRESTO) 49-51 MG Take 1 tablet by mouth 2 (two) times daily. 180 tablet 3   sotalol (BETAPACE) 160 MG tablet Take 1 tablet (160 mg total) by mouth 2 (two) times daily. 60 tablet 3   tadalafil (CIALIS) 5 MG tablet TAKE 1 TABLET BY MOUTH DAILY AS NEEDED FOR ERECTILE DYSFUNCTION. 90 tablet 1   B-D UF III MINI PEN NEEDLES 31G X 5 MM MISC See admin instructions. (Patient not taking: Reported on 06/04/2021)     buPROPion (WELLBUTRIN XL) 150 MG 24 hr tablet Take 1 tablet (150 mg total) by mouth daily. (Patient not taking: Reported on 06/04/2021) 90 tablet 1   liraglutide (VICTOZA) 18 MG/3ML SOPN Inject 1.8 mg into the  skin every morning.     No current facility-administered medications for this visit.    No Known Allergies  Social History   Socioeconomic History   Marital status: Married    Spouse name: Dennard Nip, MD   Number of children: 1   Years of education: BA   Highest education level: Not on file  Occupational History   Occupation: [employer]    Employer: [employer]  Tobacco Use   Smoking status: Never   Smokeless tobacco: Former    Types: Chew    Quit date: 11/21/1997  Vaping Use   Vaping Use: Never used  Substance and Sexual Activity   Alcohol use: Yes    Alcohol/week: 3.0 standard drinks    Types: 3  Standard drinks or equivalent per week    Comment: social   Drug use: No   Sexual activity: Yes    Partners: Female  Other Topics Concern   Not on file  Social History Narrative   Caffeine use: 2 cups coffee per day   Caffeine free soda      Right handed    Social Determinants of Health   Financial Resource Strain: Not on file  Food Insecurity: Not on file  Transportation Needs: Not on file  Physical Activity: Not on file  Stress: Not on file  Social Connections: Not on file  Intimate Partner Violence: Not on file     Review of Systems: General: No chills, fever, night sweats or weight changes  Cardiovascular:  No chest pain, dyspnea on exertion, edema, orthopnea, palpitations, paroxysmal nocturnal dyspnea Dermatological: No rash, lesions or masses Respiratory: No cough, dyspnea Urologic: No hematuria, dysuria Abdominal: No nausea, vomiting, diarrhea, bright red blood per rectum, melena, or hematemesis Neurologic: No visual changes, weakness, changes in mental status All other systems reviewed and are otherwise negative except as noted above.  Physical Exam: Vitals:   06/04/21 0844  BP: 112/80  Pulse: (!) 59  SpO2: 97%  Weight: 244 lb (110.7 kg)  Height: '5\' 9"'$  (1.753 m)    GEN- The patient is well appearing, alert and oriented x 3 today.   HEENT: normocephalic, atraumatic; sclera clear, conjunctiva pink; hearing intact; oropharynx clear; neck supple, no JVP Lymph- no cervical lymphadenopathy Lungs- Clear to ausculation bilaterally, normal work of breathing.  No wheezes, rales, rhonchi Heart- Regular rate and rhythm, no murmurs, rubs or gallops, PMI not laterally displaced GI- soft, non-tender, non-distended, bowel sounds present, no hepatosplenomegaly Extremities- no clubbing, cyanosis, or edema; DP/PT/radial pulses 2+ bilaterally MS- no significant deformity or atrophy Skin- warm and dry, no rash or lesion Psych- euthymic mood, full affect Neuro- strength and  sensation are intact  EKG is ordered. Personal review of EKG from today shows Sinus bradycardia at 59 bpm, QRS 144 ms, PR interval 178 ms. Stable from prior.  Less PVCs  Additional studies reviewed include: Previous EP office notes   Monitor 06/2020 PVC burden 14% on flecainide   Monitor 10/2020 PVC burden 16.6% on mexiletine   Echo 09/2020  LVEF 40-45%   Monitor 02/2021 With PVC burden of 26%. Sotalol was increased.   Echo 04/2021 LVEF 35-40%  Assessment and Plan:  1.  PVCs:  Had an attempted PVC ablation on 02/14/2021, unfortunately they were too close to the RCA.  Normal coronary arteries by recent catheterization.   Sotalol increased 04/09/21.  Mexiletine added 05/14/21 No PVCs on EKG today. Monitor was mailed last Thursday. Have asked [facility] to expedite.    2.  Nonischemic cardiomyopathy:  Potentially Toprol XL and Entresto per primary cardiology.   Echo 04/29/2021 with LVEF still in 35-40% range.  Continue Entresto and sotalol.   3.  Hypertension:  Continue current medications.    Overall doing well. Further pending monitor, but no PVCs on EKG today suggestive of improvement.  Shirley Friar, PA-C  06/04/21 9:15 AM

## 2021-06-04 ENCOUNTER — Other Ambulatory Visit: Payer: Self-pay

## 2021-06-04 ENCOUNTER — Ambulatory Visit (INDEPENDENT_AMBULATORY_CARE_PROVIDER_SITE_OTHER): Payer: 59 | Admitting: Student

## 2021-06-04 ENCOUNTER — Encounter: Payer: Self-pay | Admitting: Student

## 2021-06-04 VITALS — BP 112/80 | HR 59 | Ht 69.0 in | Wt 244.0 lb

## 2021-06-04 DIAGNOSIS — I493 Ventricular premature depolarization: Secondary | ICD-10-CM | POA: Diagnosis not present

## 2021-06-04 DIAGNOSIS — I428 Other cardiomyopathies: Secondary | ICD-10-CM | POA: Diagnosis not present

## 2021-06-04 DIAGNOSIS — I10 Essential (primary) hypertension: Secondary | ICD-10-CM | POA: Diagnosis not present

## 2021-06-04 NOTE — Patient Instructions (Signed)
Medication Instructions:  Your physician recommends that you continue on your current medications as directed. Please refer to the Current Medication list given to you today.  *If you need a refill on your cardiac medications before your next appointment, please call your pharmacy*   Lab Work: None If you have labs (blood work) drawn today and your tests are completely normal, you will receive your results only by: [place] (if you have MyChart) OR A paper copy in the mail If you have any lab test that is abnormal or we need to change your treatment, we will call you to review the results.   Follow-Up: At [facility] you and your health needs are our priority.  As part of our continuing mission to provide you with exceptional heart care, we have created designated Provider Care Teams.  These Care Teams include your primary Cardiologist (physician) and Advanced Practice Providers (APPs -  Physician Assistants and Nurse Practitioners) who all work together to provide you with the care you need, when you need it.   Your next appointment:   3 month(s)  The format for your next appointment:   In Person  Provider:   You may see Will Meredith Leeds, MD or one of the following Advanced Practice Providers on your designated Care Team:   Tommye Standard, [place] Legrand Como "[facility]" [place], [place]

## 2021-07-20 ENCOUNTER — Other Ambulatory Visit: Payer: Self-pay | Admitting: Cardiology

## 2021-07-22 ENCOUNTER — Other Ambulatory Visit: Payer: Self-pay | Admitting: Family Medicine

## 2021-07-22 DIAGNOSIS — N529 Male erectile dysfunction, unspecified: Secondary | ICD-10-CM

## 2021-07-22 NOTE — Telephone Encounter (Signed)
This is Dr. Hilty's pt 

## 2021-09-17 ENCOUNTER — Other Ambulatory Visit: Payer: Self-pay

## 2021-09-17 ENCOUNTER — Encounter: Payer: Self-pay | Admitting: Cardiology

## 2021-09-17 ENCOUNTER — Ambulatory Visit (INDEPENDENT_AMBULATORY_CARE_PROVIDER_SITE_OTHER): Payer: 59 | Admitting: Cardiology

## 2021-09-17 VITALS — BP 132/82 | HR 53 | Ht 69.0 in | Wt 240.0 lb

## 2021-09-17 DIAGNOSIS — I493 Ventricular premature depolarization: Secondary | ICD-10-CM

## 2021-09-17 DIAGNOSIS — Z79899 Other long term (current) drug therapy: Secondary | ICD-10-CM | POA: Diagnosis not present

## 2021-09-17 LAB — MAGNESIUM: Magnesium: 2.3 mg/dL (ref 1.6–2.3)

## 2021-09-17 LAB — BASIC METABOLIC PANEL
BUN/Creatinine Ratio: 15 (ref 9–20)
BUN: 15 mg/dL (ref 6–24)
CO2: 24 mmol/L (ref 20–29)
Calcium: 9.7 mg/dL (ref 8.7–10.2)
Chloride: 103 mmol/L (ref 96–106)
Creatinine, Ser: 1.02 mg/dL (ref 0.76–1.27)
Glucose: 90 mg/dL (ref 70–99)
Potassium: 4.5 mmol/L (ref 3.5–5.2)
Sodium: 140 mmol/L (ref 134–144)
eGFR: 88 mL/min/{1.73_m2} (ref >59)

## 2021-09-17 NOTE — Patient Instructions (Signed)
Medication Instructions:  Your physician recommends that you continue on your current medications as directed. Please refer to the Current Medication list given to you today.  *If you need a refill on your cardiac medications before your next appointment, please call your pharmacy*   Lab Work: Sotalol surveillance lab work today: BMET & Magnesium  If you have labs (blood work) drawn today and your tests are completely normal, you will receive your results only by: [place] (if you have MyChart) OR A paper copy in the mail If you have any lab test that is abnormal or we need to change your treatment, we will call you to review the results.   Testing/Procedures: None ordered   Follow-Up: At [facility], you and your health needs are our priority.  As part of our continuing mission to provide you with exceptional heart care, we have created designated Provider Care Teams.  These Care Teams include your primary Cardiologist (physician) and Advanced Practice Providers (APPs -  Physician Assistants and Nurse Practitioners) who all work together to provide you with the care you need, when you need it.  Your next appointment:   6 month(s)  The format for your next appointment:   In Person  Provider:   You will see one of the following Advanced Practice Providers on your designated Care Team:   Tommye Standard, [place] Legrand Como "[facility]" [place], [place]    Thank you for choosing [facility]!!   Trinidad Curet, RN [phone]

## 2021-09-17 NOTE — Progress Notes (Signed)
Electrophysiology Office Note   Date:  09/17/2021   ID:  Gary Berry, DOB 1971-08-19, MRN [id number]  PCP:  Leamon Arnt, MD  Cardiologist: Debara Pickett Primary Electrophysiologist:  Janisha Bueso Meredith Leeds, MD    Chief Complaint: PVC   History of Present Illness: Gary Berry is a 50 y.o. male who is being seen today for the evaluation of PVC at the request of Leamon Arnt, MD. Presenting today for electrophysiology evaluation.  He has a history of hypertension.  He was found to have an elevated PVC burden of 19%.  He had an attempted PVC ablation 02/14/2021 though PVCs were close to the right coronary artery.  He was started on both sotalol and mexiletine.  He wore a repeat cardiac monitor that showed a PVC burden of 5.9%.  Today, denies symptoms of palpitations, chest pain, shortness of breath, orthopnea, PND, lower extremity edema, claudication, dizziness, presyncope, syncope, bleeding, or neurologic sequela. The patient is tolerating medications without difficulties.  Currently he feels well.  He has no chest pain or shortness of breath.  Is able to do all of his daily activities.  He is unaware of further PVCs.  He is overall happy with his control.   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   Hyperlipidemia    Hyperlipidemia associated with type 2 diabetes mellitus ([place]) 11/21/2016   Hypertension associated with diabetes ([place]) 11/21/2016   Obesity, Class II, BMI 35-39.9 04/27/2955   Systolic congestive heart failure with reduced left ventricular function, NYHA class 2 ([place]) 04/24/2020   Echocardiogram 04/2020:  Ef 35-40%, global hypokinesis   Type 2 diabetes mellitus without complication, without long-term current use of insulin ([place]) 11/21/2016   Past Surgical History:  Procedure Laterality Date   HERNIA REPAIR  2016   hard to wake up after sx   PVC ABLATION N/A  02/14/2021   Procedure: PVC ABLATION;  Surgeon: Constance Haw, MD;  Location: [facility];  Service: Cardiovascular;  Laterality: N/A;   RIGHT/LEFT HEART CATH AND CORONARY ANGIOGRAPHY N/A 05/04/2020   Procedure: RIGHT/LEFT HEART CATH AND CORONARY ANGIOGRAPHY;  Surgeon: Leonie Man, MD;  Location: [facility];  Service: Cardiovascular;  Laterality: N/A;   TONSILLECTOMY  1975   VASECTOMY       Current Outpatient Medications  Medication Sig Dispense Refill   aspirin EC 81 MG tablet Take 81 mg by mouth daily. Swallow whole.     buPROPion (WELLBUTRIN XL) 150 MG 24 hr tablet Take 1 tablet (150 mg total) by mouth daily. 90 tablet 1   cetirizine (ZYRTEC) 10 MG tablet Take 10 mg by mouth daily as needed for allergies.     Cholecalciferol (VITAMIN D) 125 MCG (5000 UT) CAPS Take 5,000 Units by mouth daily.      dapagliflozin propanediol (FARXIGA) 5 MG TABS tablet Take 1 tablet (5 mg total) by mouth daily before breakfast. 30 tablet 11   ibuprofen (ADVIL) 200 MG tablet Take 400 mg by mouth every 6 (six) hours as needed for headache or moderate pain.     mexiletine (MEXITIL) 250 MG capsule Take 1 capsule (250 mg total) by mouth 2 (two) times daily. 180 capsule 3   naproxen sodium (ALEVE) 220 MG tablet Take 220 mg by mouth daily as needed (pain).     Omega-3 Fatty Acids (FISH OIL) 1000 MG CAPS Take 2,000 mg by mouth  daily.     rosuvastatin (CRESTOR) 20 MG tablet Take 1 tablet (20 mg total) by mouth daily. 90 tablet 1   sacubitril-valsartan (ENTRESTO) 49-51 MG Take 1 tablet by mouth 2 (two) times daily. 180 tablet 3   sotalol (BETAPACE) 160 MG tablet TAKE 2 TABLETS BY MOUTH TWICE A DAY 60 tablet 3   tadalafil (CIALIS) 5 MG tablet TAKE 1 TABLET BY MOUTH DAILY AS NEEDED FOR ERECTILE DYSFUNCTION. 90 tablet 1   No current facility-administered medications for this visit.    Allergies:   Patient has no known allergies.   Social History:  The patient  reports that he has never  smoked. He quit smokeless tobacco use about 23 years ago.  His smokeless tobacco use included chew. He reports current alcohol use of about 3.0 standard drinks per week. He reports that he does not use drugs.   Family History:  The patient's family history includes Atrial fibrillation in his father; Colon polyps in his father; Diabetes in his mother; Hypertension in his mother.   ROS:  Please see the history of present illness.   Otherwise, review of systems is positive for none.   All other systems are reviewed and negative.   PHYSICAL EXAM: VS:  BP 132/82   Pulse (!) 53   Ht 5\' 9"  (1.753 m)   Wt 240 lb (108.9 kg)   SpO2 95%   BMI 35.44 kg/m  , BMI Body mass index is 35.44 kg/m. GEN: Well nourished, well developed, in no acute distress  HEENT: normal  Neck: no JVD, carotid bruits, or masses Cardiac: RRR; no murmurs, rubs, or gallops,no edema  Respiratory:  clear to auscultation bilaterally, normal work of breathing GI: soft, nontender, nondistended, + BS MS: no deformity or atrophy  Skin: warm and dry Neuro:  Strength and sensation are intact Psych: euthymic mood, full affect  EKG:  EKG is ordered today. Personal review of the ekg ordered shows sinus rhythm, rate 55  Recent Labs: 04/01/2021: ALT 20; Hemoglobin 15.9; Platelets 220.0; TSH 1.25 05/14/2021: BUN 13; Creatinine, Ser 1.01; Magnesium 2.0; Potassium 4.7; Sodium 142    Lipid Panel     Component Value Date/Time   CHOL 159 04/01/2021 0900   TRIG 139.0 04/01/2021 0900   HDL 44.90 04/01/2021 0900   CHOLHDL 4 04/01/2021 0900   VLDL 27.8 04/01/2021 0900   LDLCALC 86 04/01/2021 0900   LDLCALC 73 07/19/2020 0905   LDLDIRECT 69.0 08/02/2019 0919     Wt Readings from Last 3 Encounters:  09/17/21 240 lb (108.9 kg)  06/04/21 244 lb (110.7 kg)  05/17/21 254 lb 6.4 oz (115.4 kg)      Other studies Reviewed: Additional studies/ records that were reviewed today include: TTE 04/29/21 Review of the above records today  demonstrates:   1. Diffuse hypokinesis abnormal septal motion EF similar to TTE done  09/27/20. Left ventricular ejection fraction, by estimation, is 35 to 40%.  The left ventricle has moderately decreased function. The left ventricle  demonstrates global hypokinesis. Left   ventricular diastolic parameters were normal.   2. Right ventricular systolic function is mildly reduced. The right  ventricular size is mildly enlarged. There is normal pulmonary artery  systolic pressure.   3. Left atrial size was moderately dilated.   4. The mitral valve is normal in structure. Trivial mitral valve  regurgitation. No evidence of mitral stenosis.   5. The aortic valve is tricuspid. There is mild calcification of the  aortic valve. Aortic valve  regurgitation is not visualized. Mild aortic  valve sclerosis is present, with no evidence of aortic valve stenosis.   6. The inferior vena cava is normal in size with greater than 50%  respiratory variability, suggesting right atrial pressure of 3 mmHg.   Cardiac monitor 06/10/2021 personally reviewed Max 126 bpm 05:02am, 08/11 Min 41 bpm 03:45am, 08/10 Avg 61 bpm Less than 1% supraventricular ectopy 5.9% ventricular ectopy 0% atrial fibrillation noted No triggered events recorded  ASSESSMENT AND PLAN:  1.  PVCs: Normal coronary arteries by recent catheterization.  He is status post PVC ablation attempt 02/14/2021.  Unfortunately PVCs were coming from close to the right coronary artery.  He is now on sotalol 160 mg twice daily, mexiletine 250 mg twice daily.  We Lionel Woodberry check labs for high risk medication, sotalol monitoring today.  PVC burden is down to 5.9%.  Without burden, I would not expect his PVCs to be the cause of his reduced ejection fraction.  We Patrizia Paule continue with current management.  2.  Nonischemic cardiomyopathy: Currently on Toprol-XL and Entresto per primary cardiology.  Repeat echo shows a stable ejection fraction.  Plan per primary  cardiology.  3.  Hypertension: Currently well controlled  Current medicines are reviewed at length with the patient today.   The patient does not have concerns regarding his medicines.  The following changes were made today: None  Labs/ tests ordered today include:  Orders Placed This Encounter  Procedures   Basic metabolic panel   Magnesium   EKG 12-Lead      Disposition:   FU with Vadim Centola 6 months  Signed, Shaquala Broeker Meredith Leeds, MD  09/17/2021 10:04 AM     [address] [place] [place] [address] [phone] (office) [phone] (fax)

## 2021-09-18 ENCOUNTER — Other Ambulatory Visit: Payer: Self-pay | Admitting: Internal Medicine

## 2021-09-19 ENCOUNTER — Encounter: Payer: Self-pay | Admitting: Internal Medicine

## 2021-09-19 ENCOUNTER — Ambulatory Visit (INDEPENDENT_AMBULATORY_CARE_PROVIDER_SITE_OTHER): Payer: 59 | Admitting: Internal Medicine

## 2021-09-19 ENCOUNTER — Other Ambulatory Visit: Payer: Self-pay

## 2021-09-19 VITALS — BP 116/82 | HR 72 | Ht 69.0 in | Wt 242.2 lb

## 2021-09-19 DIAGNOSIS — I493 Ventricular premature depolarization: Secondary | ICD-10-CM

## 2021-09-19 DIAGNOSIS — I428 Other cardiomyopathies: Secondary | ICD-10-CM

## 2021-09-19 DIAGNOSIS — E119 Type 2 diabetes mellitus without complications: Secondary | ICD-10-CM | POA: Diagnosis not present

## 2021-09-19 DIAGNOSIS — I10 Essential (primary) hypertension: Secondary | ICD-10-CM | POA: Diagnosis not present

## 2021-09-19 MED ORDER — DAPAGLIFLOZIN PROPANEDIOL 10 MG PO TABS: 10.0000 mg | ORAL_TABLET | Freq: Every day | ORAL | 3 refills | Status: DC

## 2021-09-19 NOTE — Progress Notes (Signed)
OFFICE CONSULT NOTE  Chief Complaint:  Follow-up heart failure  Primary Care Physician: Leamon Arnt, MD  HPI:  Gary Berry is a 50 y.o. male who is being seen today for the evaluation of heart failure at the request of Leamon Arnt, MD.  This is a pleasant 50 year old male whose wife is at [place].  He was referred for evaluation of newly recognized systolic congestive heart failure.  Apparently he had been having some palpitations which were evaluated by his PCP.  An echocardiogram was ordered as well as a Holter monitor.  He was found to have some PVCs on and off his EKG.  The echocardiogram was performed on 04/19/2020 and indicated an LVEF of 35 to 40% with global hypokinesis and moderate LVH, grade 1 diastolic dysfunction and moderate RV systolic dysfunction.  Pressure and IVC were normal with moderate left atrial enlargement.  Despite these findings, Gary Berry has no heart failure symptoms.  He denies worsening shortness of breath, orthopnea, lower extremity edema, chest pain, presyncope, syncopal episodes or any other associated symptoms.  He has had palpitations as mentioned and was noted to have PVCs.  The Holter monitor result is not yet available to determine his burden of PVCs.  According to his wife he also has a history of some alcohol use and was drinking about 3-4 drinks a night which had picked up since the beginning of the Covid pandemic.  Apparently before that it was much less.  He has recently decreased this.  In addition he was described as a very heavy caffeine user and recently has been decreasing that as well.  There is heart disease in his family including his father who had heart disease in his 62s.  He also has type 2 diabetes without insulin use, hypertension and mild obesity.  Recent hemoglobin A1c was 5.3.  Direct LDL in October was 69, however triglycerides were elevated at 273.  06/28/2020  Gary Berry returns today for follow-up.  Overall he  continues to feel well.  He is generally unaware of his heart failure or PVCs.  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  There is also a right bundle branch block which has been stable even prior to starting flecainide.  QTc is 431 ms.  Repeat lab work on [place] showed normal creatinine and blood pressure is excellent at 118/78.  09/27/2020  Gary Berry returns today for follow-up. Overall he is continuing to feel well. In fact he says he is never felt unwell. He saw Dr. Curt Bears who put him on mexiletine. He seems to be tolerating this and has had improvement in his PVCs. I did not detect any on his EKG today. He had an echo earlier this morning which had not been formally read. My review suggest that there is improvement in LV function possibly up to 45 to 50%. The suggest that overall he seems to be responding to therapy. I think there is still blood pressure room to consider up titrating his Entresto.  04/09/2021  Gary Berry is seen today in follow-up.  He underwent PVC ablation in April 2022 after minimal improvement on antiarrhythmic therapy with mexiletine.  This was partially successful however the focus of his PVCs was very near the right coronary artery ostium.  This limited the extent of ablation possible.  Subsequently he has  had persistent and significant PVCs.  He was placed on sotalol and increased up to 160 mg twice daily.  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  Previous echo in December showed some improvement in LVEF up to 40 to 45%.  Blood pressure is low today however and will not allow further up titration of his Entresto.  He is on Victoza however has a low A1c of 5.4.  It might be reasonable to consider switching him from Victoza to an SGLT2 inhibitor such as [place] or  [place].  09/19/2021  Gary Berry returns today for follow-up of heart failure.  Unfortunately his LVEF has not significantly improved.  Seems to be hanging around 40 to 45%.  He just saw Dr. Curt Bears a few days ago and was noted to be in sinus rhythm.  He a monitor showed his PVC burden was low at 5% on combination sotalol and mexiletine.  He is also on Entresto at a moderate dose, limited by blood pressure, also Farxiga 5 mg daily.  He endorses NYHA class I symptoms.  Overall he feels like he is doing well.  At this point Dr. Curt Bears did not feel that his PVCs were still contributing to cardiomyopathy.  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   Hyperlipidemia    Hyperlipidemia associated with type 2 diabetes mellitus ([place]) 11/21/2016   Hypertension associated with diabetes ([place]) 11/21/2016   Obesity, Class II, BMI 35-39.9 03/26/6194   Systolic congestive heart failure with reduced left ventricular function, NYHA class 2 ([place]) 04/24/2020   Echocardiogram 04/2020:  Ef 35-40%, global hypokinesis   Type 2 diabetes mellitus without complication, without long-term current use of insulin ([place]) 11/21/2016    Past Surgical History:  Procedure Laterality Date   HERNIA REPAIR  2016   hard to wake up after sx   PVC ABLATION N/A 02/14/2021   Procedure: PVC ABLATION;  Surgeon: Constance Haw, MD;  Location: [facility];  Service: Cardiovascular;  Laterality: N/A;   RIGHT/LEFT HEART CATH AND CORONARY ANGIOGRAPHY N/A 05/04/2020   Procedure: RIGHT/LEFT HEART CATH AND CORONARY ANGIOGRAPHY;  Surgeon: Leonie Man, MD;  Location: [facility];  Service: Cardiovascular;  Laterality: N/A;   TONSILLECTOMY  1975   VASECTOMY      FAMHx:  Family History  Problem Relation Age of Onset   Diabetes Mother    Hypertension Mother    Atrial fibrillation Father    Colon polyps  Father    Colon cancer Neg Hx    Esophageal cancer Neg Hx    Rectal cancer Neg Hx    Stomach cancer Neg Hx     SOCHx:   reports that he has never smoked. He quit smokeless tobacco use about 23 years ago.  His smokeless tobacco use included chew. He reports current alcohol use of about 3.0 standard drinks per week. He reports that he does not use drugs.  ALLERGIES:  No Known Allergies  ROS: Pertinent items noted in HPI and remainder of comprehensive ROS otherwise negative.  HOME MEDS: Current Outpatient Medications on File Prior to Visit  Medication Sig Dispense Refill   aspirin EC 81 MG tablet Take 81 mg by mouth daily. Swallow whole.     buPROPion (WELLBUTRIN XL) 150 MG 24 hr tablet Take 1 tablet (150 mg total) by mouth daily. 90 tablet 1  cetirizine (ZYRTEC) 10 MG tablet Take 10 mg by mouth daily as needed for allergies.     Cholecalciferol (VITAMIN D) 125 MCG (5000 UT) CAPS Take 5,000 Units by mouth daily.      dapagliflozin propanediol (FARXIGA) 5 MG TABS tablet Take 1 tablet (5 mg total) by mouth daily before breakfast. 30 tablet 11   ENTRESTO 49-51 MG TAKE 1 TABLET BY MOUTH TWICE A DAY 180 tablet 3   ibuprofen (ADVIL) 200 MG tablet Take 400 mg by mouth every 6 (six) hours as needed for headache or moderate pain.     mexiletine (MEXITIL) 250 MG capsule Take 1 capsule (250 mg total) by mouth 2 (two) times daily. 180 capsule 3   naproxen sodium (ALEVE) 220 MG tablet Take 220 mg by mouth daily as needed (pain).     Omega-3 Fatty Acids (FISH OIL) 1000 MG CAPS Take 2,000 mg by mouth daily.     rosuvastatin (CRESTOR) 20 MG tablet Take 1 tablet (20 mg total) by mouth daily. 90 tablet 1   sotalol (BETAPACE) 160 MG tablet TAKE 2 TABLETS BY MOUTH TWICE A DAY 60 tablet 3   tadalafil (CIALIS) 5 MG tablet TAKE 1 TABLET BY MOUTH DAILY AS NEEDED FOR ERECTILE DYSFUNCTION. 90 tablet 1   No current facility-administered medications on file prior to visit.    LABS/IMAGING: Results for orders  placed or performed in visit on 09/17/21 (from the past 48 hour(s))  Basic metabolic panel     Status: None   Collection Time: 09/17/21 10:11 AM  Result Value Ref Range   Glucose 90 70 - 99 mg/dL   BUN 15 6 - 24 mg/dL   Creatinine, Ser 1.02 0.76 - 1.27 mg/dL   eGFR 88 >59 mL/min/1.73   BUN/Creatinine Ratio 15 9 - 20   Sodium 140 134 - 144 mmol/L   Potassium 4.5 3.5 - 5.2 mmol/L   Chloride 103 96 - 106 mmol/L   CO2 24 20 - 29 mmol/L   Calcium 9.7 8.7 - 10.2 mg/dL  Magnesium     Status: None   Collection Time: 09/17/21 10:11 AM  Result Value Ref Range   Magnesium 2.3 1.6 - 2.3 mg/dL   No results found.  LIPID PANEL:    Component Value Date/Time   CHOL 159 04/01/2021 0900   TRIG 139.0 04/01/2021 0900   HDL 44.90 04/01/2021 0900   CHOLHDL 4 04/01/2021 0900   VLDL 27.8 04/01/2021 0900   LDLCALC 86 04/01/2021 0900   LDLCALC 73 07/19/2020 0905   LDLDIRECT 69.0 08/02/2019 0919    WEIGHTS: Wt Readings from Last 3 Encounters:  09/19/21 242 lb 3.2 oz (109.9 kg)  09/17/21 240 lb (108.9 kg)  06/04/21 244 lb (110.7 kg)    VITALS: BP 116/82   Pulse 72   Ht _0  (1.753 m)   Wt 242 lb 3.2 oz (109.9 kg)   SpO2 98%   BMI 35.77 kg/m   EXAM: General appearance: alert, no distress and mildly obese Neck: no carotid bruit, no JVD and thyroid not enlarged, symmetric, no tenderness/mass/nodules Lungs: clear to auscultation bilaterally Heart: regular rate and rhythm, S1, S2 normal, no murmur, click, rub or gallop Abdomen: soft, non-tender; bowel sounds normal; no masses,  no organomegaly Extremities: extremities normal, atraumatic, no cyanosis or edema Pulses: 2+ and symmetric Skin: Skin color, texture, turgor normal. 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   Gary Berry seems to be stable with NYHA class I symptoms.  His PVC burden has declined significantly down to about 5.9% from 20%.  LVEF is not totally improved to normal and remains around 40 to 45%.  He is on a moderate dose of Entresto however not sure the blood pressure will tolerate increases.  He is on 5 mg of Farxiga.  I like to increase that to 10 mg daily.  We will continue mexiletine and sotalol.  Plan a repeat echo in 6 months and follow-up at that time.  His if his EF is not significantly improved at that point, will refer to advanced heart failure.  Pixie Casino, MD, [facility], [place] Director of the Advanced Lipid Disorders &  Cardiovascular Risk Reduction Clinic Diplomate of the American Board of Clinical Lipidology Attending Cardiologist  Direct Dial: [phone]  Fax: [phone]  Website:  [email or web address] 09/19/2021, 8:26 AM

## 2021-09-19 NOTE — Patient Instructions (Signed)
Medication Instructions:  INCREASE Farxiga to 10mg  daily  *If you need a refill on your cardiac medications before your next appointment, please call your pharmacy*  Testing/Procedures: Echocardiogram in 6 months  This will be done at [address] - [address] Floor   Follow-Up: At [facility], you and your health needs are our priority.  As part of our continuing mission to provide you with exceptional heart care, we have created designated Provider Care Teams.  These Care Teams include your primary Cardiologist (physician) and Advanced Practice Providers (APPs -  Physician Assistants and Nurse Practitioners) who all work together to provide you with the care you need, when you need it.  We recommend signing up for the patient portal called "MyChart".  Sign up information is provided on this After Visit Summary.  MyChart is used to connect with patients for Virtual Visits (Telemedicine).  Patients are able to view lab/test results, encounter notes, upcoming appointments, etc.  Non-urgent messages can be sent to your provider as well.   To learn more about what you can do with MyChart, go to [email or web address].    Your next appointment:   6 month(s)  The format for your next appointment:   In Person  Provider:   Dr. Lyman Bishop

## 2021-09-22 ENCOUNTER — Other Ambulatory Visit: Payer: Self-pay | Admitting: Family Medicine

## 2021-11-07 ENCOUNTER — Other Ambulatory Visit: Payer: Self-pay | Admitting: Family Medicine

## 2021-11-18 ENCOUNTER — Other Ambulatory Visit: Payer: Self-pay | Admitting: Internal Medicine

## 2021-11-21 ENCOUNTER — Ambulatory Visit: Payer: 59 | Admitting: Family Medicine

## 2021-11-27 ENCOUNTER — Ambulatory Visit (INDEPENDENT_AMBULATORY_CARE_PROVIDER_SITE_OTHER): Payer: 59 | Admitting: Family Medicine

## 2021-11-27 ENCOUNTER — Encounter: Payer: Self-pay | Admitting: Family Medicine

## 2021-11-27 ENCOUNTER — Other Ambulatory Visit: Payer: Self-pay

## 2021-11-27 VITALS — BP 132/81 | HR 55 | Temp 98.0°F | Ht 69.0 in | Wt 243.0 lb

## 2021-11-27 DIAGNOSIS — E1159 Type 2 diabetes mellitus with other circulatory complications: Secondary | ICD-10-CM

## 2021-11-27 DIAGNOSIS — E669 Obesity, unspecified: Secondary | ICD-10-CM

## 2021-11-27 DIAGNOSIS — E119 Type 2 diabetes mellitus without complications: Secondary | ICD-10-CM

## 2021-11-27 DIAGNOSIS — I5020 Unspecified systolic (congestive) heart failure: Secondary | ICD-10-CM | POA: Diagnosis not present

## 2021-11-27 DIAGNOSIS — I152 Hypertension secondary to endocrine disorders: Secondary | ICD-10-CM

## 2021-11-27 DIAGNOSIS — I493 Ventricular premature depolarization: Secondary | ICD-10-CM | POA: Diagnosis not present

## 2021-11-27 LAB — POCT GLYCOSYLATED HEMOGLOBIN (HGB A1C): Hemoglobin A1C: 5.6 % (ref 4.0–5.6)

## 2021-11-27 NOTE — Progress Notes (Signed)
Subjective  CC:  Chief Complaint  Patient presents with   Diabetes    Does not check blood sugars at home.   mood follow up    Anxiety and depression has been good.     HPI: Gary Berry is a 51 y.o. male who presents to the office today for follow up of diabetes and problems listed above in the chief complaint.  Diabetes follow up: His diabetic control is reported as Unchanged. Very well controlled. Now on farxiga 10 for diabetes and heart failure.  He denies exertional CP or SOB or symptomatic hypoglycemia. He denies foot sores or paresthesias.  Systolic heart failure with EF 45-50 and PVCs now controlled on meds. Reviewed recent cards visit. Will get echo again in 6 months hoping EF will improve now that PVC burden is down. He feels wee.  HTN is controlled on meds.  Reactive anxiety/depression: now well controlled on wellbutrin. Feels he is doing very well now.   Wt Readings from Last 3 Encounters:  11/27/21 243 lb (110.2 kg)  09/19/21 242 lb 3.2 oz (109.9 kg)  09/17/21 240 lb (108.9 kg)    BP Readings from Last 3 Encounters:  11/27/21 132/81  09/19/21 116/82  09/17/21 132/82    Assessment  1. Type 2 diabetes mellitus without complication, without long-term current use of insulin (HCC)   2. Obesity, Class II, BMI 35-39.9   3. Hypertension associated with diabetes ([place])   4. Frequent PVCs   5. 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 Farxiga 10. Weight is stable off victoza but definitely misses the appetite suppressant effects.  HTN, CHF and PVC: all now well controlled Mood: controlled. Will continue wellbutrin xr 150 daily for next 6 months and then can reevaluate. 1st mood med.   Follow up: 6 mo for cpe. Orders Placed This Encounter  Procedures   POCT HgB A1C   No orders of the defined types were placed in this encounter.     Immunization History   Administered Date(s) Administered   Influenza Inj Mdck Quad Pf 08/01/2017   Influenza Inj Mdck Quad With Preservative 08/02/2021   Influenza, Seasonal, Injecte, Preservative Fre 08/01/2017   Influenza,inj,Quad PF,6+ Mos 12/06/2015, 11/21/2016, 08/22/2018, 07/19/2020   Influenza-Unspecified 08/22/2018, 08/01/2019   PFIZER Comirnaty(Gray Top)Covid-19 Tri-Sucrose Vaccine 02/08/2021   PFIZER(Purple Top)SARS-COV-2 Vaccination 12/30/2019, 01/20/2020, 08/18/2020   Pfizer Covid-19 Vaccine Bivalent Booster 13yrs & up 08/02/2021   Pneumococcal Polysaccharide-23 05/25/2017   Tdap 12/18/2011   Zoster Recombinat (Shingrix) 09/28/2020, 12/27/2020    Diabetes Related Lab Review: Lab Results  Component Value Date   HGBA1C 5.6 11/27/2021   HGBA1C 5.4 04/01/2021   HGBA1C 5.3 09/28/2020    No results found for: Derl Barrow Lab Results  Component Value Date   CREATININE 1.02 09/17/2021   BUN 15 09/17/2021   NA 140 09/17/2021   K 4.5 09/17/2021   CL 103 09/17/2021   CO2 24 09/17/2021   Lab Results  Component Value Date   CHOL 159 04/01/2021   CHOL 142 07/19/2020   CHOL 147 08/02/2019   Lab Results  Component Value Date   HDL 44.90 04/01/2021   HDL 47 07/19/2020   HDL 46.10 08/02/2019   Lab Results  Component Value Date   LDLCALC 86 04/01/2021   LDLCALC 73 07/19/2020   LDLCALC 68 02/21/2019   Lab Results  Component Value Date  TRIG 139.0 04/01/2021   TRIG 137 07/19/2020   TRIG 273.0 (H) 08/02/2019   Lab Results  Component Value Date   CHOLHDL 4 04/01/2021   CHOLHDL 3.0 07/19/2020   CHOLHDL 3 08/02/2019   Lab Results  Component Value Date   LDLDIRECT 69.0 08/02/2019   The 10-year ASCVD risk score (Arnett DK, et al., 2019) is: 9%   Values used to calculate the score:     Age: 81 years     Sex: Male     Is Non-Hispanic African American: No     Diabetic: Yes     Tobacco smoker: No     Systolic Blood Pressure: 097 mmHg     Is BP treated: Yes     HDL  Cholesterol: 44.9 mg/dL     Total Cholesterol: 159 mg/dL I have reviewed the PMH, Fam and Soc history. 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 He quit smokeless tobacco use about 24 years ago.  His smokeless tobacco use included chew. He reports current alcohol use of about 3.0 standard drinks per week. He reports that he does not use drugs.  Review of Systems: Ophthalmic: negative for eye pain, loss of vision or double vision Cardiovascular: negative for chest pain Respiratory: negative for SOB or persistent cough Gastrointestinal: negative for abdominal pain Genitourinary: negative for dysuria or gross hematuria MSK: negative for foot lesions Neurologic: negative for weakness or gait disturbance  Objective  Vitals: BP 132/81    Pulse (!) 55    Temp 98 F (36.7 C) (Temporal)    Ht 5\' 9"  (1.753 m)    Wt 243 lb (110.2 kg)    SpO2 93%    BMI 35.88 kg/m  General: well appearing, no acute distress  Psych:  Alert and oriented, normal mood and affect HEENT:  Normocephalic, atraumatic, moist mucous membranes, supple neck  Cardiovascular:  Nl S1 and S2, RRR without murmur, gallop or rub. no edema Respiratory:  Good breath sounds bilaterally, CTAB with normal effort, no rales   Diabetic education: ongoing  education regarding chronic disease management for diabetes was given today. We continue to reinforce the ABC's of diabetic management: A1c (<7 or 8 dependent upon patient), tight blood pressure control, and cholesterol management with goal LDL < 100 minimally. We discuss diet strategies, exercise recommendations, medication options and possible side effects. At each visit, we review recommended immunizations and preventive care recommendations for diabetics and stress that good diabetic control can prevent other problems. See below for this patient's data.   Commons side effects, risks, benefits, and alternatives for medications and treatment plan prescribed today were discussed, and the patient expressed understanding of the given instructions. Patient is instructed to call or message via MyChart if he/she has any questions or concerns regarding our treatment plan. No barriers to understanding were identified. We discussed Red Flag symptoms  and signs in detail. Patient expressed understanding regarding what to do in case of urgent or emergency type symptoms.  Medication list was reconciled, printed and provided to the patient in AVS. Patient instructions and summary information was reviewed with the patient as documented in the AVS. This note was prepared with assistance of Dragon voice recognition software. Occasional wrong-word or sound-a-like substitutions may have occurred due to the inherent limitations of voice recognition software  This visit occurred during the SARS-CoV-2 public health emergency.  Safety protocols were in place, including screening questions prior to the visit, additional usage of staff PPE, and extensive cleaning of exam room while observing appropriate contact time as indicated for disinfecting solutions.

## 2021-11-27 NOTE — Patient Instructions (Signed)
Please return in 6 months for your annual complete physical; please come fasting.   If you have any questions or concerns, please don't hesitate to send me a message via MyChart or call the office at [phone]. Thank you for visiting with us today! It's our pleasure caring for you.  

## 2022-01-19 ENCOUNTER — Telehealth: Payer: 59 | Admitting: Nurse Practitioner

## 2022-01-19 DIAGNOSIS — U071 COVID-19: Secondary | ICD-10-CM

## 2022-01-19 MED ORDER — MOLNUPIRAVIR EUA 200MG CAPSULE: 4.0000 | ORAL_CAPSULE | Freq: Two times a day (BID) | ORAL | 0 refills | Status: AC

## 2022-01-19 NOTE — Patient Instructions (Signed)

## 2022-01-19 NOTE — Progress Notes (Signed)
? ?Virtual Visit Consent  ? ?Gary Berry, you are scheduled for a virtual visit with Mary-Margaret Hassell Done, [place], a [facility] provider, today.   ?  ?Just as with appointments in the office, your consent must be obtained to participate.  Your consent will be active for this visit and any virtual visit you may have with one of our providers in the next 365 days.   ?  ?If you have a MyChart account, a copy of this consent can be sent to you electronically.  All virtual visits are billed to your insurance company just like a traditional visit in the office.   ? ?As this is a virtual visit, video technology does not allow for your provider to perform a traditional examination.  This may limit your provider's ability to fully assess your condition.  If your provider identifies any concerns that need to be evaluated in person or the need to arrange testing (such as labs, EKG, etc.), we will make arrangements to do so.   ?  ?Although advances in technology are sophisticated, we cannot ensure that it will always work on either your end or our end.  If the connection with a video visit is poor, the visit may have to be switched to a telephone visit.  With either a video or telephone visit, we are not always able to ensure that we have a secure connection.    ? ?I need to obtain your verbal consent now.   Are you willing to proceed with your visit today? YES ?  ?Gary Berry has provided verbal consent on 01/19/2022 for a virtual visit (video or telephone). ?  ?Mary-Margaret Hassell Done, FNP  ? ?Date: 01/19/2022 11:08 AM ? ? ?Virtual Visit via Video Note  ? ?I, Mary-Margaret Kloepfer, connected with Gary Berry ([id number], January 02, 1971) on 01/19/22 at 11:15 AM EDT by a video-enabled telemedicine application and verified that I am speaking with the correct person using two identifiers. ? ?Location: ?Patient: Virtual Visit Location Patient: Home ?Provider: Virtual Visit Location Provider: Mobile ?  ?I discussed the limitations of  evaluation and management by telemedicine and the availability of in person appointments. The patient expressed understanding and agreed to proceed.   ? ?History of Present Illness: ?Gary Berry is a 51 y.o. who identifies as a male who was assigned male at birth, and is being seen today for covid positive. ? ?HPI: URI  ?This is a new problem. Episode onset: friday. The problem has been gradually worsening. There has been no fever. Associated symptoms include congestion, coughing, rhinorrhea and a sore throat. Pertinent negatives include no headaches. He has tried decongestant (delsym) for the symptoms. The treatment provided mild relief.   ?Review of Systems  ?HENT:  Positive for congestion, rhinorrhea and sore throat.   ?Respiratory:  Positive for cough.   ?Neurological:  Negative for headaches.  ? ?Problems:  ?Patient Active Problem List  ? Diagnosis Date Noted  ? PVC (premature ventricular contraction) 02/14/2021  ? Frequent PVCs 05/04/2020  ? 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 Type 2 diabetes mellitus without complication, without long-term current use of insulin ([place]) 11/21/2016  ? Hypertension associated with diabetes ([place]) 11/21/2016  ? Hyperlipidemia associated with type 2 diabetes mellitus ([place]) 11/21/2016  ? 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  ? Obesity, Class II, BMI 35-39.9 11/21/2016  ? Umbilical hernia 27/04/8674  ?  ?Allergies: No Known Allergies ?Medications:  ?Current Outpatient Medications:  ?  aspirin EC 81 MG tablet, Take 81 mg by mouth daily. Swallow whole., Disp: , Rfl:  ?  buPROPion (WELLBUTRIN XL) 150 MG 24 hr tablet, TAKE 1 TABLET BY MOUTH EVERY DAY, Disp: 90 tablet, Rfl: 1 ?  cetirizine (ZYRTEC) 10 MG tablet, Take 10 mg by mouth daily as needed for allergies., Disp: , Rfl:  ?  Cholecalciferol (VITAMIN D) 125 MCG (5000 UT) CAPS, Take 5,000 Units by mouth daily. , Disp: , Rfl:  ?  dapagliflozin  propanediol (FARXIGA) 10 MG TABS tablet, Take 1 tablet (10 mg total) by mouth daily before breakfast., Disp: 90 tablet, Rfl: 3 ?  ENTRESTO 49-51 MG, TAKE 1 TABLET BY MOUTH TWICE A DAY, Disp: 180 tablet, Rfl: 3 ?  ibuprofen (ADVIL) 200 MG tablet, Take 400 mg by mouth every 6 (six) hours as needed for headache or moderate pain., Disp: , Rfl:  ?  mexiletine (MEXITIL) 250 MG capsule, Take 1 capsule (250 mg total) by mouth 2 (two) times daily., Disp: 180 capsule, Rfl: 3 ?  naproxen sodium (ALEVE) 220 MG tablet, Take 220 mg by mouth daily as needed (pain)., Disp: , Rfl:  ?  Omega-3 Fatty Acids (FISH OIL) 1000 MG CAPS, Take 2,000 mg by mouth daily., Disp: , Rfl:  ?  rosuvastatin (CRESTOR) 20 MG tablet, TAKE 1 TABLET BY MOUTH EVERY DAY, Disp: 90 tablet, Rfl: 1 ?  sotalol (BETAPACE) 160 MG tablet, TAKE 2 TABLETS BY MOUTH TWICE A DAY (Patient taking differently: 160 mg daily.), Disp: 60 tablet, Rfl: 3 ?  tadalafil (CIALIS) 5 MG tablet, TAKE 1 TABLET BY MOUTH DAILY AS NEEDED FOR ERECTILE DYSFUNCTION., Disp: 90 tablet, Rfl: 1 ? ?Observations/Objective: ?Patient is well-developed, well-nourished in no acute distress.  ?Resting comfortably  at home.  ?Head is normocephalic, atraumatic.  ?No labored breathing.  ?Speech is clear and coherent with logical content.  ?Patient is alert and oriented at baseline.  ?Raspy voice ?No cough during visit ? ?Assessment and Plan: ? ?Gary Berry in today with chief complaint of Covid Positive ? ? ?1. Lab test positive for detection of COVID-19 virus ?1. Take meds as prescribed ?2. Use a cool mist humidifier especially during the winter months and when heat has been humid. ?3. Use saline nose sprays frequently ?4. Saline irrigations of the nose can be very helpful if done frequently. ? * 4X daily for 1 week* ? * Use of a nettie pot can be helpful with this. Follow directions with this* ?5. Drink plenty of fluids ?6. Keep thermostat turn down low ?7.For any cough or congestion- delsym ?8. For  fever or aces or pains- take tylenol or ibuprofen appropriate for age and weight. ? * for fevers greater than 101 orally you may alternate ibuprofen and tylenol every  3 hours. ?  ?Meds ordered this encounter  ?Medications  ? molnupiravir EUA (LAGEVRIO) 200 mg CAPS capsule  ?  Sig: Take 4 capsules (800 mg total) by mouth 2 (two) times daily for 5 days.  ?  Dispense:  40 capsule  ?  Refill:  0  ?  Order Specific Question:   Supervising Provider  ?  Answer:   Noemi Chapel [[id number]]  ? ? ? ?Follow Up Instructions: ?I discussed the assessment and treatment plan with the patient. The patient was provided an opportunity to ask questions and all were answered. The patient agreed with the plan and demonstrated an understanding of the instructions.  A copy of instructions were sent to the patient via MyChart. ? ?  The patient was advised to call back or seek an in-person evaluation if the symptoms worsen or if the condition fails to improve as anticipated. ? ?Time:  ?I spent 11 minutes with the patient via telehealth technology discussing the above problems/concerns.   ? ?Mary-Margaret Hassell Done, FNP ? ?

## 2022-01-22 ENCOUNTER — Other Ambulatory Visit: Payer: Self-pay | Admitting: Family Medicine

## 2022-01-22 DIAGNOSIS — N529 Male erectile dysfunction, unspecified: Secondary | ICD-10-CM

## 2022-03-04 NOTE — Progress Notes (Signed)
? ? ?PATIENT: Gary Berry ?DOB: January 18, 1971 ? ?REASON FOR VISIT: follow up ?HISTORY FROM: patient ?PRIMARY NEUROLOGIST: Dr. Rexene Alberts ? ?Chief Complaint  ?Patient presents with  ? Follow-up  ?  Pt in 9  pt is here for CPAP follow up pt states he has no questions or concerns for today's visit   ? ? ? ?HISTORY OF PRESENT ILLNESS: ?Today 03/05/22: ? ?Gary Berry is a 51 year old male with a history of obstructive sleep apnea on CPAP.  He returns today for follow-up.  He reports that the CPAP is working well.  His download is below ? ? ? ? ? ?REVIEW OF SYSTEMS: Out of a complete 14 system review of symptoms, the patient complains only of the following symptoms, and all other reviewed systems are negative. ? ?FSS 14 ?ESS 3 ? ?ALLERGIES: ?No Known Allergies ? ?HOME MEDICATIONS: ?Outpatient Medications Prior to Visit  ?Medication Sig Dispense Refill  ? aspirin EC 81 MG tablet Take 81 mg by mouth daily. Swallow whole.    ? buPROPion (WELLBUTRIN XL) 150 MG 24 hr tablet TAKE 1 TABLET BY MOUTH EVERY DAY 90 tablet 1  ? cetirizine (ZYRTEC) 10 MG tablet Take 10 mg by mouth daily as needed for allergies.    ? Cholecalciferol (VITAMIN D) 125 MCG (5000 UT) CAPS Take 5,000 Units by mouth daily.     ? dapagliflozin propanediol (FARXIGA) 10 MG TABS tablet Take 1 tablet (10 mg total) by mouth daily before breakfast. 90 tablet 3  ? ENTRESTO 49-51 MG TAKE 1 TABLET BY MOUTH TWICE A DAY 180 tablet 3  ? ibuprofen (ADVIL) 200 MG tablet Take 400 mg by mouth every 6 (six) hours as needed for headache or moderate pain.    ? mexiletine (MEXITIL) 250 MG capsule Take 1 capsule (250 mg total) by mouth 2 (two) times daily. 180 capsule 3  ? naproxen sodium (ALEVE) 220 MG tablet Take 220 mg by mouth daily as needed (pain).    ? Omega-3 Fatty Acids (FISH OIL) 1000 MG CAPS Take 2,000 mg by mouth daily.    ? rosuvastatin (CRESTOR) 20 MG tablet TAKE 1 TABLET BY MOUTH EVERY DAY 90 tablet 1  ? sotalol (BETAPACE) 160 MG tablet TAKE 2 TABLETS BY MOUTH TWICE A DAY  (Patient taking differently: 160 mg daily.) 60 tablet 3  ? tadalafil (CIALIS) 5 MG tablet TAKE 1 TABLET BY MOUTH DAILY AS NEEDED FOR ERECTILE DYSFUNCTION. 90 tablet 1  ? ?No facility-administered medications prior to visit.  ? ? ?PAST MEDICAL HISTORY: ?Past Medical History:  ?Diagnosis Date  ? Chronic right shoulder pain, requiring injection prn 03/20/2017  ? 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  ? Hyperlipidemia   ? Hyperlipidemia associated with type 2 diabetes mellitus ([place]) 11/21/2016  ? Hypertension associated with diabetes ([place]) 11/21/2016  ? Obesity, Class II, BMI 35-39.9 11/21/2016  ? 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 Echocardiogram 04/2020:  Ef 35-40%, global hypokinesis  ? Type 2 diabetes mellitus without complication, without long-term current use of insulin ([place]) 11/21/2016  ? ? ?PAST SURGICAL HISTORY: ?Past Surgical History:  ?Procedure Laterality Date  ? HERNIA REPAIR  2016  ? hard to wake up after sx  ? PVC ABLATION N/A 02/14/2021  ? Procedure: PVC ABLATION;  Surgeon: Constance Haw, MD;  Location: [facility];  Service: Cardiovascular;  Laterality: N/A;  ? RIGHT/LEFT HEART CATH AND CORONARY ANGIOGRAPHY N/A 05/04/2020  ? Procedure: RIGHT/LEFT HEART  CATH AND CORONARY ANGIOGRAPHY;  Surgeon: Leonie Man, MD;  Location: [facility];  Service: Cardiovascular;  Laterality: N/A;  ? TONSILLECTOMY  1975  ? VASECTOMY    ? ? ?FAMILY HISTORY: ?Family History  ?Problem Relation Age of Onset  ? Diabetes Mother   ? Hypertension Mother   ? Atrial fibrillation Father   ? Colon polyps Father   ? Sleep apnea Father   ? Colon cancer Neg Hx   ? Esophageal cancer Neg Hx   ? Rectal cancer Neg Hx   ? Stomach cancer Neg Hx   ? ? ?SOCIAL HISTORY: ?Social History  ? ?Socioeconomic History  ? Marital status: Married  ?  Spouse name: Gary Nip, MD  ? Number of children: 1  ? Years of education: BA  ? Highest education level:  Not on file  ?Occupational History  ? Occupation: [employer]  ?  Employer: [employer]  ?Tobacco Use  ? Smoking status: Never  ? Smokeless tobacco: Former  ?  Types: Chew  ?  Quit date: 11/21/1997  ?Vaping Use  ? Vaping Use: Never used  ?Substance and Sexual Activity  ? Alcohol use: Not Currently  ?  Alcohol/week: 3.0 standard drinks  ?  Types: 3 Cans of beer per week  ?  Comment: social  ? Drug use: No  ? Sexual activity: Yes  ?  Partners: Female  ?Other Topics Concern  ? Not on file  ?Social History Narrative  ? Caffeine use: 2 cups coffee per day  ? Caffeine free soda  ?   ? Right handed   ? ?Social Determinants of Health  ? ?Financial Resource Strain: Not on file  ?Food Insecurity: Not on file  ?Transportation Needs: Not on file  ?Physical Activity: Not on file  ?Stress: Not on file  ?Social Connections: Not on file  ?Intimate Partner Violence: Not on file  ? ? ? ? ?PHYSICAL EXAM ? ?Vitals:  ? 03/05/22 1036  ?BP: 120/74  ?Pulse: (!) 54  ?Weight: 240 lb (108.9 kg)  ?Height: '5\' 9"'$  (1.753 m)  ? ?Body mass index is 35.44 kg/m?. ? ?Generalized: Well developed, in no acute distress  ?Chest: Lungs clear to auscultation bilaterally ? ?Neurological examination  ?Mentation: Alert oriented to time, place, history taking. Follows all commands speech and language fluent ?Cranial nerve II-XII: Extraocular movements were full, visual field were full on confrontational test Head turning and shoulder shrug  were normal and symmetric. ?Gait and station: Gait is normal.  ? ? ?DIAGNOSTIC DATA (LABS, IMAGING, TESTING) ?- I reviewed patient records, labs, notes, testing and imaging myself where available. ? ?Lab Results  ?Component Value Date  ? WBC 6.7 04/01/2021  ? HGB 15.9 04/01/2021  ? HCT 47.5 04/01/2021  ? MCV 93.6 04/01/2021  ? PLT 220.0 04/01/2021  ? ?   ?Component Value Date/Time  ? NA 140 09/17/2021 1011  ? K 4.5 09/17/2021 1011  ? CL 103 09/17/2021 1011  ? CO2 24 09/17/2021 1011  ? GLUCOSE 90 09/17/2021 1011  ? GLUCOSE 95 04/01/2021  0900  ? BUN 15 09/17/2021 1011  ? CREATININE 1.02 09/17/2021 1011  ? CALCIUM 9.7 09/17/2021 1011  ? PROT 7.1 04/01/2021 0900  ? ALBUMIN 4.5 04/01/2021 0900  ? AST 20 04/01/2021 0900  ? ALT 20 04/01/2021 0900  ? ALKPHOS 48 04/01/2021 0900  ? BILITOT 0.6 04/01/2021 0900  ? GFRNONAA >60 02/15/2021 0400  ? GFRAA 97 11/16/2020 0939  ? ?Lab Results  ?Component Value Date  ?  CHOL 159 04/01/2021  ? HDL 44.90 04/01/2021  ? [place] 86 04/01/2021  ? LDLDIRECT 69.0 08/02/2019  ? TRIG 139.0 04/01/2021  ? CHOLHDL 4 04/01/2021  ? ?Lab Results  ?Component Value Date  ? HGBA1C 5.6 11/27/2021  ? ?Lab Results  ?Component Value Date  ? OHFGBMSX11 748 02/21/2019  ? ?Lab Results  ?Component Value Date  ? TSH 1.25 04/01/2021  ? ? ? ? ?ASSESSMENT AND PLAN ?51 y.o. 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 here with: ? ?OSA on CPAP ? ?- CPAP compliance excellent ?- Good treatment of AHI  ?- Encourage patient to use CPAP nightly and > 4 hours each night ?- F/U in 1 year or sooner if needed ? ? ? ? ?Ward Givens, MSN, NP-C 03/05/2022, 10:42 AM ?[facility] ?[address], [address] ?[place], [place] [address] ?[phone] ? ? ?

## 2022-03-05 ENCOUNTER — Ambulatory Visit (INDEPENDENT_AMBULATORY_CARE_PROVIDER_SITE_OTHER): Payer: 59 | Admitting: Adult Health

## 2022-03-05 ENCOUNTER — Encounter: Payer: Self-pay | Admitting: Adult Health

## 2022-03-05 VITALS — BP 120/74 | HR 54 | Ht 69.0 in | Wt 240.0 lb

## 2022-03-05 DIAGNOSIS — G4733 Obstructive sleep apnea (adult) (pediatric): Secondary | ICD-10-CM | POA: Diagnosis not present

## 2022-03-05 DIAGNOSIS — Z9989 Dependence on other enabling machines and devices: Secondary | ICD-10-CM | POA: Diagnosis not present

## 2022-03-05 NOTE — Patient Instructions (Signed)
Continue using CPAP nightly and greater than 4 hours each night °If your symptoms worsen or you develop new symptoms please let us know.  ° °

## 2022-03-06 ENCOUNTER — Ambulatory Visit: Payer: 59 | Admitting: Family Medicine

## 2022-03-14 ENCOUNTER — Ambulatory Visit (HOSPITAL_COMMUNITY): Payer: 59 | Attending: Internal Medicine

## 2022-03-14 DIAGNOSIS — I428 Other cardiomyopathies: Secondary | ICD-10-CM | POA: Insufficient documentation

## 2022-03-14 DIAGNOSIS — I10 Essential (primary) hypertension: Secondary | ICD-10-CM | POA: Insufficient documentation

## 2022-03-14 DIAGNOSIS — I493 Ventricular premature depolarization: Secondary | ICD-10-CM | POA: Diagnosis not present

## 2022-03-14 LAB — ECHOCARDIOGRAM COMPLETE
Area-P 1/2: 3.46 cm2
S' Lateral: 4.2 cm

## 2022-03-16 ENCOUNTER — Other Ambulatory Visit: Payer: Self-pay | Admitting: Family Medicine

## 2022-03-16 ENCOUNTER — Other Ambulatory Visit: Payer: Self-pay | Admitting: Internal Medicine

## 2022-03-29 NOTE — Progress Notes (Unsigned)
Cardiology Clinic Note   Patient Name: Gary Berry Date of Encounter: 03/31/2022  Primary Care Provider:  Leamon Arnt, MD Primary Cardiologist:  None  Patient Profile    Gary Berry 51 year old male presents to the clinic today for follow-up evaluation of his systolic CHF and PVCs.  Past Medical History    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   Hyperlipidemia    Hyperlipidemia associated with type 2 diabetes mellitus ([place]) 11/21/2016   Hypertension associated with diabetes ([place]) 11/21/2016   Obesity, Class II, BMI 35-39.9 3/0/0923   Systolic congestive heart failure with reduced left ventricular function, NYHA class 2 ([place]) 04/24/2020   Echocardiogram 04/2020:  Ef 35-40%, global hypokinesis   Type 2 diabetes mellitus without complication, without long-term current use of insulin ([place]) 11/21/2016   Past Surgical History:  Procedure Laterality Date   HERNIA REPAIR  2016   hard to wake up after sx   PVC ABLATION N/A 02/14/2021   Procedure: PVC ABLATION;  Surgeon: Constance Haw, MD;  Location: [facility];  Service: Cardiovascular;  Laterality: N/A;   RIGHT/LEFT HEART CATH AND CORONARY ANGIOGRAPHY N/A 05/04/2020   Procedure: RIGHT/LEFT HEART CATH AND CORONARY ANGIOGRAPHY;  Surgeon: Leonie Man, MD;  Location: [facility];  Service: Cardiovascular;  Laterality: N/A;   TONSILLECTOMY  1975   VASECTOMY      Allergies  No Known Allergies  History of Present Illness    Gary Berry has a PMH of HTN, systolic CHF, nonischemic cardiomyopathy, PVCs, type 2 diabetes, hyperlipidemia, obesity, and umbilical hernia.  He was seen in follow-up by Dr. Debara Pickett on 09/19/2021.  His LVEF at that time continued to be 40-45%.  He had been seen in follow-up by Dr. Curt Bears and was noted to be in sinus rhythm.  He wore a cardiac event monitor which showed his  PVC burden was around 5%.  His sotalol and mexiletine were continued.  He was continued on his Delene Loll which was unable to be titrated due to blood pressure.  It was felt to be NYHA class I.  Overall from a cardiac standpoint he continued to do well.  It was felt that his PVCs were not contributing to his cardiomyopathy.  He presents to the clinic today for follow-up evaluation and states he is tolerating his medications well.  His weight remains stable.  He does not had any salt to his food.  He has been walking 3 times per week for about 15 to 20 minutes.  We reviewed his echocardiogram and he expressed understanding.  We reviewed his cardiac event monitor.  His blood pressure today is 110/72.  I will give him the salty 6 diet sheet, have him increase his physical activity as tolerated, continue his current medication regimen, and plan follow-up in 6 months.  Today he denies chest pain, shortness of breath, lower extremity edema, fatigue, palpitations, melena, hematuria, hemoptysis, diaphoresis, weakness, presyncope, syncope, orthopnea, and PND.    Home Medications    Prior to Admission medications   Medication Sig Start Date End Date Taking? Authorizing Provider  aspirin EC 81 MG tablet Take 81 mg by mouth daily. Swallow whole.    [provider]  buPROPion (WELLBUTRIN XL) 150 MG 24 hr tablet TAKE 1 TABLET BY MOUTH EVERY DAY 03/18/22   Leamon Arnt, MD  cetirizine (ZYRTEC) 10 MG tablet Take 10  mg by mouth daily as needed for allergies.    [provider]  Cholecalciferol (VITAMIN D) 125 MCG (5000 UT) CAPS Take 5,000 Units by mouth daily.     [provider]  dapagliflozin propanediol (FARXIGA) 10 MG TABS tablet Take 1 tablet (10 mg total) by mouth daily before breakfast. 09/19/21   Hilty, Nadean Corwin, MD  ENTRESTO 49-51 MG TAKE 1 TABLET BY MOUTH TWICE A DAY 09/18/21   Hilty, Nadean Corwin, MD  ibuprofen (ADVIL) 200 MG tablet Take 400 mg by mouth every 6 (six) hours as  needed for headache or moderate pain.    [provider]  mexiletine (MEXITIL) 250 MG capsule Take 1 capsule (250 mg total) by mouth 2 (two) times daily. 05/14/21   Shirley Friar, PA-C  naproxen sodium (ALEVE) 220 MG tablet Take 220 mg by mouth daily as needed (pain).    [provider]  Omega-3 Fatty Acids (FISH OIL) 1000 MG CAPS Take 2,000 mg by mouth daily.    [provider]  rosuvastatin (CRESTOR) 20 MG tablet TAKE 1 TABLET BY MOUTH EVERY DAY 11/07/21   Leamon Arnt, MD  sotalol (BETAPACE) 160 MG tablet TAKE 2 TABLETS BY MOUTH TWICE A DAY 03/18/22   Hilty, Nadean Corwin, MD  tadalafil (CIALIS) 5 MG tablet TAKE 1 TABLET BY MOUTH DAILY AS NEEDED FOR ERECTILE DYSFUNCTION. 01/27/22   Leamon Arnt, MD    Family History    Family History  Problem Relation Age of Onset   Diabetes Mother    Hypertension Mother    Atrial fibrillation Father    Colon polyps Father    Sleep apnea Father    Colon cancer Neg Hx    Esophageal cancer Neg Hx    Rectal cancer Neg Hx    Stomach cancer Neg Hx    He indicated that his mother is alive. He indicated that his father is alive. He indicated that his brother is alive. He indicated that his son is alive. He indicated that the status of his neg hx is unknown.  Social History    Social History   Socioeconomic History   Marital status: Married    Spouse name: Dennard Nip, MD   Number of children: 1   Years of education: BA   Highest education level: Not on file  Occupational History   Occupation: [employer]    Employer: [employer]  Tobacco Use   Smoking status: Never   Smokeless tobacco: Former    Types: Chew    Quit date: 11/21/1997  Vaping Use   Vaping Use: Never used  Substance and Sexual Activity   Alcohol use: Not Currently    Alcohol/week: 3.0 standard drinks of alcohol    Types: 3 Cans of beer per week    Comment: social   Drug use: No   Sexual activity: Yes    Partners: Female  Other Topics Concern    Not on file  Social History Narrative   Caffeine use: 2 cups coffee per day   Caffeine free soda      Right handed    Social Determinants of Health   Financial Resource Strain: Not on file  Food Insecurity: Not on file  Transportation Needs: Not on file  Physical Activity: Not on file  Stress: Not on file  Social Connections: Not on file  Intimate Partner Violence: Not on file     Review of Systems    General:  No chills, fever, night sweats or  weight changes.  Cardiovascular:  No chest pain, dyspnea on exertion, edema, orthopnea, palpitations, paroxysmal nocturnal dyspnea. Dermatological: No rash, lesions/masses Respiratory: No cough, dyspnea Urologic: No hematuria, dysuria Abdominal:   No nausea, vomiting, diarrhea, bright red blood per rectum, melena, or hematemesis Neurologic:  No visual changes, wkns, changes in mental status. All other systems reviewed and are otherwise negative except as noted above.  Physical Exam    VS:  BP 110/72 (BP Location: Left Arm)   Pulse (!) 54   Ht '5\' 9"'$  (1.753 m)   Wt 243 lb 6.4 oz (110.4 kg)   SpO2 97%   BMI 35.94 kg/m  , BMI Body mass index is 35.94 kg/m. GEN: Well nourished, well developed, in no acute distress. HEENT: normal. Neck: Supple, no JVD, carotid bruits, or masses. Cardiac: RRR, no murmurs, rubs, or gallops. No clubbing, cyanosis, edema.  Radials/DP/PT 2+ and equal bilaterally.  Respiratory:  Respirations regular and unlabored, clear to auscultation bilaterally. GI: Soft, nontender, nondistended, BS + x 4. MS: no deformity or atrophy. Skin: warm and dry, no rash. Neuro:  Strength and sensation are intact. Psych: Normal affect.  Accessory Clinical Findings    Recent Labs: 04/01/2021: ALT 20; Hemoglobin 15.9; Platelets 220.0; TSH 1.25 09/17/2021: BUN 15; Creatinine, Ser 1.02; Magnesium 2.3; Potassium 4.5; Sodium 140   Recent Lipid Panel    Component Value Date/Time   CHOL 159 04/01/2021 0900   TRIG 139.0  04/01/2021 0900   HDL 44.90 04/01/2021 0900   CHOLHDL 4 04/01/2021 0900   VLDL 27.8 04/01/2021 0900   LDLCALC 86 04/01/2021 0900   LDLCALC 73 07/19/2020 0905   LDLDIRECT 69.0 08/02/2019 0919    ECG personally reviewed by me today-sinus bradycardia right bundle branch block 54 bpm- No acute changes  Echocardiogram 03/14/2022 IMPRESSIONS     1. Left ventricular ejection fraction, by estimation, is 45 to 50%. The  left ventricle has mildly decreased function. The left ventricle has no  regional wall motion abnormalities. The left ventricular internal cavity  size was mildly dilated. Left  ventricular diastolic parameters were normal.   2. Right ventricular systolic function is mildly reduced. The right  ventricular size is mildly enlarged. There is normal pulmonary artery  systolic pressure. The estimated right ventricular systolic pressure is  50.9 mmHg.   3. Left atrial size was mildly dilated.   4. The mitral valve is grossly normal. Trivial mitral valve  regurgitation. No evidence of mitral stenosis.   5. The aortic valve is tricuspid. Aortic valve regurgitation is not  visualized. No aortic stenosis is present.   6. The inferior vena cava is normal in size with greater than 50%  respiratory variability, suggesting right atrial pressure of 3 mmHg.   Comparison(s): Changes from prior study are noted. EF has improved ~50%.   Assessment & Plan   1.  Nonischemic cardiomyopathy-NYHA class I.  Weight stable.  Echocardiogram 03/14/2022 showed an LVEF of 45-50% with normal diastolic parameters.  Details above. Continue Lisabeth Register, sotalol Heart healthy low-sodium diet-salty 6 given Increase physical activity as tolerated   Essential hypertension-BP today 110/72.  Well-controlled at home. Continue sotalol, Entresto, Heart healthy low-sodium diet-salty 6 given Increase physical activity as tolerated  PVCs-previously seen and evaluated by EP.  More cardiac event monitor.   PVC burden was found to be around 5% and not felt to be contributing to cardiomyopathy. Continue sotalol, mexiletine Heart healthy low-sodium diet-salty 6 given Increase physical activity as tolerated  Type 2 diabetes-glucose 90 on  09/17/2021. Continue current medical therapy Follows with PCP  Disposition: Follow-up with Dr. Debara Pickett or me in 6 months.   Jossie Ng. Fabiola Mudgett NP-C    03/31/2022, 9:25 AM [address] Office [phone] Fax [phone]  Notice: This dictation was prepared with Dragon dictation along with smaller phrase technology. Any transcriptional errors that result from this process are unintentional and may not be corrected upon review.  I spent 13 minutes examining this patient, reviewing medications, and using patient centered shared decision making involving her cardiac care.  Prior to her visit I spent greater than 20 minutes reviewing her past medical history,  medications, and prior cardiac tests.

## 2022-03-31 ENCOUNTER — Ambulatory Visit (INDEPENDENT_AMBULATORY_CARE_PROVIDER_SITE_OTHER): Payer: 59 | Admitting: General Practice

## 2022-03-31 ENCOUNTER — Encounter: Payer: Self-pay | Admitting: Cardiology

## 2022-03-31 ENCOUNTER — Encounter: Payer: Self-pay | Admitting: General Practice

## 2022-03-31 VITALS — BP 110/72 | HR 54 | Ht 69.0 in | Wt 243.4 lb

## 2022-03-31 DIAGNOSIS — I10 Essential (primary) hypertension: Secondary | ICD-10-CM

## 2022-03-31 DIAGNOSIS — I493 Ventricular premature depolarization: Secondary | ICD-10-CM | POA: Diagnosis not present

## 2022-03-31 DIAGNOSIS — I428 Other cardiomyopathies: Secondary | ICD-10-CM

## 2022-03-31 DIAGNOSIS — E119 Type 2 diabetes mellitus without complications: Secondary | ICD-10-CM | POA: Diagnosis not present

## 2022-03-31 NOTE — Patient Instructions (Signed)
Medication Instructions:  The current medical regimen is effective;  continue present plan and medications as directed. Please refer to the Current Medication list given to you today.   *If you need a refill on your cardiac medications before your next appointment, please call your pharmacy*  Lab Work:   Testing/Procedures:  NONE    NONE  If you have labs (blood work) drawn today and your tests are completely normal, you will receive your results only by: [place] (if you have MyChart) OR  A paper copy in the mail If you have any lab test that is abnormal or we need to change your treatment, we will call you to review the results.  Special Instructions PLEASE READ AND FOLLOW SALTY 6-ATTACHED-1,'800mg'$  daily  PLEASE INCREASE PHYSICAL ACTIVITY AS TOLERATED YOUR GOAL IS 150 MINUTES OF MODERATE PHYSICAL ACTIVITY WEEKLY  Follow-Up: Your next appointment:  6 month(s) In Person with Pixie Casino, MD  or Coletta Memos, FNP     :1  At [facility], you and your health needs are our priority.  As part of our continuing mission to provide you with exceptional heart care, we have created designated Provider Care Teams.  These Care Teams include your primary Cardiologist (physician) and Advanced Practice Providers (APPs -  Physician Assistants and Nurse Practitioners) who all work together to provide you with the care you need, when you need it.   Important Information About Sugar               6 SALTY THINGS TO AVOID     1,'800MG'$  DAILY

## 2022-04-03 MED ORDER — SOTALOL HCL 160 MG PO TABS
320.0000 mg | ORAL_TABLET | Freq: Two times a day (BID) | ORAL | 1 refills | Status: DC
Start: 2022-04-03 — End: 2022-07-04

## 2022-04-07 ENCOUNTER — Ambulatory Visit: Payer: 59 | Admitting: Internal Medicine

## 2022-04-14 ENCOUNTER — Telehealth: Payer: 59 | Admitting: Physician Assistant

## 2022-04-14 DIAGNOSIS — L247 Irritant contact dermatitis due to plants, except food: Secondary | ICD-10-CM | POA: Diagnosis not present

## 2022-04-14 MED ORDER — PREDNISONE 10 MG PO TABS: ORAL_TABLET | ORAL | 0 refills | Status: DC

## 2022-04-14 NOTE — Progress Notes (Signed)
Virtual Visit Consent   Gary Berry, you are scheduled for a virtual visit with a  provider today. Just as with appointments in the office, your consent must be obtained to participate. Your consent will be active for this visit and any virtual visit you may have with one of our providers in the next 365 days. If you have a MyChart account, a copy of this consent can be sent to you electronically.  As this is a virtual visit, video technology does not allow for your provider to perform a traditional examination. This may limit your provider's ability to fully assess your condition. If your provider identifies any concerns that need to be evaluated in person or the need to arrange testing (such as labs, EKG, etc.), we will make arrangements to do so. Although advances in technology are sophisticated, we cannot ensure that it will always work on either your end or our end. If the connection with a video visit is poor, the visit may have to be switched to a telephone visit. With either a video or telephone visit, we are not always able to ensure that we have a secure connection.  By engaging in this virtual visit, you consent to the provision of healthcare and authorize for your insurance to be billed (if applicable) for the services provided during this visit. Depending on your insurance coverage, you may receive a charge related to this service.  I need to obtain your verbal consent now. Are you willing to proceed with your visit today? Gary Berry has provided verbal consent on 04/14/2022 for a virtual visit (video or telephone). Margaretann Loveless, PA-C  Date: 04/14/2022 3:04 PM  Virtual Visit via Video Note   I, Margaretann Loveless, connected with  Gary Berry  ([id number], 1971/03/13) on 04/14/22 at  3:00 PM EDT by a video-enabled telemedicine application and verified that I am speaking with the correct person using two identifiers.  Location: Patient: Virtual Visit Location Patient:  Home Provider: Virtual Visit Location Provider: Home Office   I discussed the limitations of evaluation and management by telemedicine and the availability of in person appointments. The patient expressed understanding and agreed to proceed.    History of Present Illness: Gary Berry is a 51 y.o. who identifies as a male who was assigned male at birth, and is being seen today for poison ivy dermatitis.  HPI: Poison Gary Berry This is a new problem. The current episode started 1 to 4 weeks ago. The problem has been gradually worsening since onset. The affected locations include the left arm and right arm. The rash is characterized by blistering, redness and itchiness. He was exposed to plant contact. Pertinent negatives include no congestion, cough, fatigue, fever, shortness of breath or sore throat. Past treatments include antihistamine and anti-itch cream. The treatment provided no relief.      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   Systolic congestive heart failure with reduced left ventricular function, NYHA class 2 ([facility]) 04/24/2020   Type 2 diabetes mellitus without complication, without long-term current use of insulin ([facility]) 11/21/2016   Hypertension associated with diabetes ([facility]) 11/21/2016   Hyperlipidemia associated with type 2 diabetes mellitus ([facility]) 11/21/2016   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   Obesity, Class II, BMI 35-39.9 11/21/2016   Umbilical hernia 06/23/2014    Allergies: No Known Allergies Medications:  Current Outpatient Medications:  predniSONE (DELTASONE) 10 MG tablet, Days 1-4 take 4 tablets (40 mg) daily  Days 5-8 take 3 tablets (30 mg) daily, Days 9-11 take 2 tablets (20 mg) daily, Days 12-14 take 1 tablet (10 mg) daily., Disp: 37 tablet, Rfl: 0   aspirin EC 81 MG tablet, Take 81 mg by mouth daily. Swallow whole., Disp: , Rfl:    buPROPion  (WELLBUTRIN XL) 150 MG 24 hr tablet, TAKE 1 TABLET BY MOUTH EVERY DAY, Disp: 90 tablet, Rfl: 1   cetirizine (ZYRTEC) 10 MG tablet, Take 10 mg by mouth daily as needed for allergies., Disp: , Rfl:    Cholecalciferol (VITAMIN D) 125 MCG (5000 UT) CAPS, Take 5,000 Units by mouth daily. , Disp: , Rfl:    dapagliflozin propanediol (FARXIGA) 10 MG TABS tablet, Take 1 tablet (10 mg total) by mouth daily before breakfast., Disp: 90 tablet, Rfl: 3   ENTRESTO 49-51 MG, TAKE 1 TABLET BY MOUTH TWICE A DAY, Disp: 180 tablet, Rfl: 3   ibuprofen (ADVIL) 200 MG tablet, Take 400 mg by mouth every 6 (six) hours as needed for headache or moderate pain., Disp: , Rfl:    mexiletine (MEXITIL) 250 MG capsule, Take 1 capsule (250 mg total) by mouth 2 (two) times daily., Disp: 180 capsule, Rfl: 3   naproxen sodium (ALEVE) 220 MG tablet, Take 220 mg by mouth daily as needed (pain)., Disp: , Rfl:    Omega-3 Fatty Acids (FISH OIL) 1000 MG CAPS, Take 2,000 mg by mouth daily., Disp: , Rfl:    rosuvastatin (CRESTOR) 20 MG tablet, TAKE 1 TABLET BY MOUTH EVERY DAY, Disp: 90 tablet, Rfl: 1   sotalol (BETAPACE) 160 MG tablet, Take 2 tablets (320 mg total) by mouth 2 (two) times daily., Disp: 180 tablet, Rfl: 1   tadalafil (CIALIS) 5 MG tablet, TAKE 1 TABLET BY MOUTH DAILY AS NEEDED FOR ERECTILE DYSFUNCTION., Disp: 90 tablet, Rfl: 1  Observations/Objective: Patient is well-developed, well-nourished in no acute distress.  Resting comfortably at home.  Head is normocephalic, atraumatic.  No labored breathing.  Speech is clear and coherent with logical content.  Patient is alert and oriented at baseline.    Assessment and Plan: 1. Irritant contact dermatitis due to plants, except food - predniSONE (DELTASONE) 10 MG tablet; Days 1-4 take 4 tablets (40 mg) daily  Days 5-8 take 3 tablets (30 mg) daily, Days 9-11 take 2 tablets (20 mg) daily, Days 12-14 take 1 tablet (10 mg) daily.  Dispense: 37 tablet; Refill: 0  - Poison ivy  exposure with rash - Will prescribe Prednisone 2 week taper dose as above - May use topical Hydrocortisone cream, benadryl cream, and/or calamine lotion for itching - Cool compresses - Luke warm to cool showers - Seek in person evaluation if rash continues to spread or if any appear to become infected  Follow Up Instructions: I discussed the assessment and treatment plan with the patient. The patient was provided an opportunity to ask questions and all were answered. The patient agreed with the plan and demonstrated an understanding of the instructions.  A copy of instructions were sent to the patient via MyChart unless otherwise noted below.    The patient was advised to call back or seek an in-person evaluation if the symptoms worsen or if the condition fails to improve as anticipated.  Time:  I spent 8 minutes with the patient via telehealth technology discussing the above problems/concerns.    Margaretann Loveless, PA-C

## 2022-05-06 ENCOUNTER — Other Ambulatory Visit: Payer: Self-pay | Admitting: Family Medicine

## 2022-05-12 ENCOUNTER — Other Ambulatory Visit: Payer: Self-pay | Admitting: Student

## 2022-05-15 ENCOUNTER — Ambulatory Visit: Payer: 59 | Admitting: Adult Health

## 2022-05-28 ENCOUNTER — Encounter: Payer: Self-pay | Admitting: Family Medicine

## 2022-05-28 ENCOUNTER — Ambulatory Visit (INDEPENDENT_AMBULATORY_CARE_PROVIDER_SITE_OTHER): Payer: 59 | Admitting: Family Medicine

## 2022-05-28 ENCOUNTER — Encounter (INDEPENDENT_AMBULATORY_CARE_PROVIDER_SITE_OTHER): Payer: Self-pay

## 2022-05-28 VITALS — BP 118/80 | HR 58 | Temp 98.0°F | Ht 69.0 in | Wt 236.4 lb

## 2022-05-28 DIAGNOSIS — I152 Hypertension secondary to endocrine disorders: Secondary | ICD-10-CM

## 2022-05-28 DIAGNOSIS — E119 Type 2 diabetes mellitus without complications: Secondary | ICD-10-CM | POA: Diagnosis not present

## 2022-05-28 DIAGNOSIS — E785 Hyperlipidemia, unspecified: Secondary | ICD-10-CM | POA: Diagnosis not present

## 2022-05-28 DIAGNOSIS — Z0000 Encounter for general adult medical examination without abnormal findings: Secondary | ICD-10-CM

## 2022-05-28 DIAGNOSIS — F4323 Adjustment disorder with mixed anxiety and depressed mood: Secondary | ICD-10-CM | POA: Insufficient documentation

## 2022-05-28 DIAGNOSIS — I428 Other cardiomyopathies: Secondary | ICD-10-CM

## 2022-05-28 DIAGNOSIS — E1169 Type 2 diabetes mellitus with other specified complication: Secondary | ICD-10-CM | POA: Diagnosis not present

## 2022-05-28 DIAGNOSIS — Z23 Encounter for immunization: Secondary | ICD-10-CM

## 2022-05-28 DIAGNOSIS — E1159 Type 2 diabetes mellitus with other circulatory complications: Secondary | ICD-10-CM

## 2022-05-28 DIAGNOSIS — I5020 Unspecified systolic (congestive) heart failure: Secondary | ICD-10-CM | POA: Diagnosis not present

## 2022-05-28 LAB — COMPREHENSIVE METABOLIC PANEL
ALT: 22 U/L (ref 0–53)
AST: 21 U/L (ref 0–37)
Albumin: 4.8 g/dL (ref 3.5–5.2)
Alkaline Phosphatase: 63 U/L (ref 39–117)
BUN: 15 mg/dL (ref 6–23)
CO2: 30 mEq/L (ref 19–32)
Calcium: 10 mg/dL (ref 8.4–10.5)
Chloride: 103 mEq/L (ref 96–112)
Creatinine, Ser: 1.08 mg/dL (ref 0.40–1.50)
GFR: 78.35 mL/min (ref >60.00)
Glucose, Bld: 108 mg/dL — ABNORMAL HIGH (ref 70–99)
Potassium: 4.9 mEq/L (ref 3.5–5.1)
Sodium: 136 mEq/L (ref 135–145)
Total Bilirubin: 0.6 mg/dL (ref 0.2–1.2)
Total Protein: 7.4 g/dL (ref 6.0–8.3)

## 2022-05-28 LAB — LIPID PANEL
Cholesterol: 171 mg/dL (ref 0–200)
HDL: 45.4 mg/dL (ref >39.00)
NonHDL: 125.93
Total CHOL/HDL Ratio: 4
Triglycerides: 269 mg/dL — ABNORMAL HIGH (ref 0.0–149.0)
VLDL: 53.8 mg/dL — ABNORMAL HIGH (ref 0.0–40.0)

## 2022-05-28 LAB — CBC WITH DIFFERENTIAL/PLATELET
Basophils Absolute: 0.1 10*3/uL (ref 0.0–0.1)
Basophils Relative: 1.1 % (ref 0.0–3.0)
Eosinophils Absolute: 0.1 10*3/uL (ref 0.0–0.7)
Eosinophils Relative: 1.8 % (ref 0.0–5.0)
HCT: 50.8 % (ref 39.0–52.0)
Hemoglobin: 16.9 g/dL (ref 13.0–17.0)
Lymphocytes Relative: 26.6 % (ref 12.0–46.0)
Lymphs Abs: 1.7 10*3/uL (ref 0.7–4.0)
MCHC: 33.2 g/dL (ref 30.0–36.0)
MCV: 96.5 fl (ref 78.0–100.0)
Monocytes Absolute: 0.6 10*3/uL (ref 0.1–1.0)
Monocytes Relative: 8.7 % (ref 3.0–12.0)
Neutro Abs: 4 10*3/uL (ref 1.4–7.7)
Neutrophils Relative %: 61.8 % (ref 43.0–77.0)
Platelets: 228 10*3/uL (ref 150.0–400.0)
RBC: 5.27 Mil/uL (ref 4.22–5.81)
RDW: 13.3 % (ref 11.5–15.5)
WBC: 6.5 10*3/uL (ref 4.0–10.5)

## 2022-05-28 LAB — MICROALBUMIN / CREATININE URINE RATIO
Creatinine,U: 52.6 mg/dL
Microalb Creat Ratio: 1.3 mg/g (ref 0.0–30.0)
Microalb, Ur: <0.7 mg/dL (ref 0.0–1.9)

## 2022-05-28 LAB — HEMOGLOBIN A1C: Hgb A1c MFr Bld: 6 % (ref 4.6–6.5)

## 2022-05-28 LAB — LDL CHOLESTEROL, DIRECT: Direct LDL: 75 mg/dL

## 2022-05-28 LAB — TSH: TSH: 1.18 u[IU]/mL (ref 0.35–5.50)

## 2022-05-28 NOTE — Progress Notes (Signed)
Subjective  Chief Complaint  Patient presents with   Annual Exam    Pt here for Annual exam and is currently fasting     HPI: Gary Berry is a 51 y.o. male who presents to [facility] today for a Male Wellness Visit. He also has the concerns and/or needs as listed above in the chief complaint. These will be addressed in addition to the Health Maintenance Visit.   Wellness Visit: annual visit with health maintenance review and exam   Health maintenance: Screens are up-to-date.   Eligible for Tdap today.  Continues to eat well and has lost more weight.  Exercising.  Feels physically well.  Had difficult year with job changes.  Fortunately was not late but did have a job change.  He is adjusting well.  Body mass index is 34.91 kg/m. Wt Readings from Last 3 Encounters:  05/28/22 236 lb 6.4 oz (107.2 kg)  03/31/22 243 lb 6.4 oz (110.4 kg)  03/05/22 240 lb (108.9 kg)     Chronic disease management visit and/or acute problem visit: Nonischemic cardiomyopathy and heart failure: Reviewed cardiology notes.  Stable on multiple medications.  No symptoms of volume overload.  No palpitations.  No chest pain. Diabetes: Very well-controlled with diet and exercise.  Also on Farxiga, for heart failure.  Tolerating well.  No symptoms of hyper glycemia.  No retinopathy.  No foot paresthesias.  Due for urine nephropathy screening. Hypertension has been well-controlled.  He is on Entresto Hyperlipidemia on Crestor 20 mg nightly.  LDL goal less than 60.  Tolerates well.  Fasting for recheck today.  No adverse effects. Adjustment reaction: Continues on Wellbutrin XR 150 daily.  He feels this is quite helpful.  Manages stress anxiety symptoms well.  No adverse effects.  Patient Active Problem List   Diagnosis Date Noted   Nonischemic cardiomyopathy ([place]) 05/28/2022   Adjustment reaction with anxiety and depression 05/28/2022   PVC (premature ventricular contraction) 02/14/2021    Frequent PVCs 16/07/9603   Systolic congestive heart failure with reduced left ventricular function, NYHA class 2 ([place]) 04/24/2020   Type 2 diabetes mellitus without complication, without long-term current use of insulin ([place]) 11/21/2016   Hypertension associated with diabetes ([place]) 11/21/2016   Hyperlipidemia associated with type 2 diabetes mellitus ([place]) 11/21/2016   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   Obesity, Class II, BMI 35-39.9 54/06/8118   Umbilical hernia 14/78/2956   Health Maintenance  Topic Date Due   TETANUS/TDAP  12/17/2021   INFLUENZA VACCINE  05/20/2022   HEMOGLOBIN A1C  05/27/2022   OPHTHALMOLOGY EXAM  06/03/2022   FOOT EXAM  05/29/2023   COLONOSCOPY (Pts 45-32yr Insurance coverage will need to be confirmed)  07/28/2029   COVID-19 Vaccine  Completed   Hepatitis C Screening  Completed   Zoster Vaccines- Shingrix  Completed   HPV VACCINES  Aged Out   HIV Screening  Discontinued   Immunization History  Administered Date(s) Administered   Influenza Inj Mdck Quad Pf 08/01/2017   Influenza Inj Mdck Quad With Preservative 08/02/2021   Influenza, Seasonal, Injecte, Preservative Fre 08/01/2017   Influenza,inj,Quad PF,6+ Mos 12/06/2015, 11/21/2016, 08/22/2018, 07/19/2020   Influenza-Unspecified 08/22/2018, 08/01/2019   PFIZER Comirnaty(Gray Top)Covid-19 Tri-Sucrose Vaccine 02/08/2021   PFIZER(Purple Top)SARS-COV-2 Vaccination 12/30/2019, 01/20/2020, 08/18/2020   Pfizer Covid-19 Vaccine Bivalent Booster 144yr& up 08/02/2021   Pneumococcal Polysaccharide-23 05/25/2017   Tdap 12/18/2011   Zoster Recombinat (Shingrix) 09/28/2020, 12/27/2020   We updated and reviewed the  patient's past history in detail and it is documented below. Allergies: Patient has No Known Allergies. Past Medical History  has a past medical history of Chronic right shoulder pain, requiring injection prn (03/20/2017), ED (erectile dysfunction) of organic origin (11/21/2016),  Hyperlipidemia, Hyperlipidemia associated with type 2 diabetes mellitus ([place]) (11/21/2016), Hypertension associated with diabetes ([place]) (11/21/2016), Obesity, Class II, BMI 35-39.9 (03/22/6947), Systolic congestive heart failure with reduced left ventricular function, NYHA class 2 ([place]) (04/24/2020), and Type 2 diabetes mellitus without complication, without long-term current use of insulin ([place]) (11/21/2016). Past Surgical History Patient  has a past surgical history that includes Tonsillectomy (1975); Hernia repair (2016); Vasectomy; RIGHT/LEFT HEART CATH AND CORONARY ANGIOGRAPHY (N/A, 05/04/2020); and PVC ABLATION (N/A, 02/14/2021). Social History Patient  reports that he has never smoked. He quit smokeless tobacco use about 24 years ago.  His smokeless tobacco use included chew. He reports that he does not currently use alcohol after a past usage of about 3.0 standard drinks of alcohol per week. He reports that he does not use drugs. Family History family history includes Atrial fibrillation in his father; Colon polyps in his father; Diabetes in his mother; Hypertension in his mother; Sleep apnea in his father. Review of Systems: Constitutional: negative for fever or malaise Ophthalmic: negative for photophobia, double vision or loss of vision Cardiovascular: negative for chest pain, dyspnea on exertion, or new LE swelling Respiratory: negative for SOB or persistent cough Gastrointestinal: negative for abdominal pain, change in bowel habits or melena Genitourinary: negative for dysuria or gross hematuria Musculoskeletal: negative for new gait disturbance or muscular weakness Integumentary: negative for new or persistent rashes Neurological: negative for TIA or stroke symptoms Psychiatric: negative for SI or delusions Allergic/Immunologic: negative for hives  Patient Care Team    Relationship Specialty Notifications Start End  Leamon Arnt, MD PCP - General Family Medicine  03/31/22   Constance Haw, MD PCP - Electrophysiology Cardiology  08/06/20   Pixie Casino, MD PCP - Cardiology Cardiology  03/31/22   Pixie Casino, MD Consulting Physician Cardiology  05/03/20    Objective  Vitals: BP 118/80   Pulse (!) 58   Temp 98 F (36.7 C)   Ht '5\' 9"'$  (1.753 m)   Wt 236 lb 6.4 oz (107.2 kg)   SpO2 98%   BMI 34.91 kg/m  General:  Well developed, well nourished, no acute distress  Psych:  Alert and orientedx3,normal mood and affect HEENT:  Normocephalic, atraumatic, non-icteric sclera, PERRL, oropharynx is clear without mass or exudate, supple neck without adenopathy, mass or thyromegaly Cardiovascular:  Normal S1, S2, RRR without gallop, rub or murmur, nondisplaced PMI, +2 distal pulses in bilateral upper and lower extremities. Respiratory:  Good breath sounds bilaterally, CTAB with normal respiratory effort Gastrointestinal: normal bowel sounds, soft, non-tender, no noted masses. No HSM MSK: no deformities, contusions. Joints are without erythema or swelling. Spine and CVA region are nontender Skin:  Warm, no rashes or suspicious lesions noted Neurologic:    Mental status is normal. CN 2-11 are normal. Gross motor and sensory exams are normal. Stable gait. No tremor    Assessment  1. Annual physical exam   2. Need for Tdap vaccination   3. Type 2 diabetes mellitus without complication, without long-term current use of insulin ([place])   4. Hypertension associated with diabetes ([place])   5. Hyperlipidemia associated with type 2 diabetes mellitus ([place])   6. Systolic congestive heart failure with reduced left ventricular function, NYHA class 2 (  [place])   7. Nonischemic cardiomyopathy ([place])   8. Adjustment reaction with anxiety and depression      Plan  Male Wellness Visit: Age appropriate Health Maintenance and Prevention measures were discussed with patient. Included topics are cancer screening recommendations, ways to keep healthy (see AVS) including dietary and exercise  recommendations, regular eye and dental care, use of seat belts, and avoidance of moderate alcohol use and tobacco use.   BMI: discussed patient's BMI and encouraged positive lifestyle modifications to help get to or maintain a target BMI. HM needs and immunizations were addressed and ordered. See below for orders. See HM and immunization section for updates.  Tdap updated today Routine labs and screening tests ordered including cmp, cbc and lipids where appropriate. Discussed recommendations regarding Vit D and calcium supplementation (see AVS)  Chronic disease f/u and/or acute problem visit: (deemed necessary to be done in addition to the wellness visit): Heart failure and hypertension are well-controlled.  Continue Entresto, sotalol.  Recheck renal function electrolytes today. Hyperlipidemia fasting for recheck today.  Check LFTs.  Crestor 20 Diabetes: Has been in remission.  Recheck A1c today.  Normal foot exam today.  Check urine nephropathy screening.  Continue Farxiga.  10 mg daily Adjustment reaction: Well-controlled on Wellbutrin XR 150 daily.  Follow up: 6 months to recheck sugars and blood pressure Commons side effects, risks, benefits, and alternatives for medications and treatment plan prescribed today were discussed, and the patient expressed understanding of the given instructions. Patient is instructed to call or message via MyChart if he/she has any questions or concerns regarding our treatment plan. No barriers to understanding were identified. We discussed Red Flag symptoms and signs in detail. Patient expressed understanding regarding what to do in case of urgent or emergency type symptoms.  Medication list was reconciled, printed and provided to the patient in AVS. Patient instructions and summary information was reviewed with the patient as documented in the AVS. This note was prepared with assistance of Dragon voice recognition software. Occasional wrong-word or sound-a-like  substitutions may have occurred due to the inherent limitations of voice recognition software  This visit occurred during the SARS-CoV-2 public health emergency.  Safety protocols were in place, including screening questions prior to the visit, additional usage of staff PPE, and extensive cleaning of exam room while observing appropriate contact time as indicated for disinfecting solutions.   Orders Placed This Encounter  Procedures   Tdap vaccine greater than or equal to 7yo IM   CBC with Differential/Platelet   Comprehensive metabolic panel   Hemoglobin A1c   Lipid panel   TSH   Microalbumin / creatinine urine ratio   No orders of the defined types were placed in this encounter.

## 2022-05-28 NOTE — Patient Instructions (Addendum)
Please return in 6 months for sugar and blood pressure recheck.   I will release your lab results to you on your MyChart account with further instructions. You may see the results before I do, but when I review them I will send you a message with my report or have my assistant call you if things need to be discussed. Please reply to my message with any questions. Thank you!   Today you were given your Tdap vaccination.  This is good for 10 years.   If you have any questions or concerns, please don't hesitate to send me a message via MyChart or call the office at [phone]. Thank you for visiting with [place] today! It's our pleasure caring for you.   Please do these things to maintain good health!  Exercise at least 30-45 minutes a day,  4-5 days a week.  Eat a low-fat diet with lots of fruits and vegetables, up to 7-9 servings per day. Drink plenty of water daily. Try to drink 8 8oz glasses per day. Seatbelts can save your life. Always wear your seatbelt. Place Smoke Detectors on every level of your home and check batteries every year. Eye Doctor - have an eye exam every 1-2 years Safe sex - use condoms to protect yourself from STDs if you could be exposed to these types of infections. Avoid heavy alcohol use. If you drink, keep it to less than 2 drinks/day and not every day. [place].  Choose someone you trust that could speak for you if you became unable to speak for yourself. Depression is common in our stressful world.If you're feeling down or losing interest in things you normally enjoy, please come in for a visit.

## 2022-07-03 ENCOUNTER — Other Ambulatory Visit: Payer: Self-pay | Admitting: Cardiology

## 2022-07-03 LAB — HM DIABETES EYE EXAM

## 2022-07-03 NOTE — Telephone Encounter (Signed)
Pt's medication sotalol 160 mg tablet is still stating that pt takes this medicine 2 tablets BID. Pt states that he takes it 1 tablet BID. Please correct the dosage on this medication. Please address

## 2022-07-04 NOTE — Telephone Encounter (Signed)
Pt's medication was sent to pt's pharmacy as requested. Confirmation received.  °

## 2022-07-04 NOTE — Telephone Encounter (Signed)
Pt is taking it 160 mg BID, please correct/refill for this dosing. Thanks

## 2022-07-14 ENCOUNTER — Encounter: Payer: Self-pay | Admitting: *Deleted

## 2022-08-05 ENCOUNTER — Encounter: Payer: Self-pay | Admitting: Family Medicine

## 2022-09-03 ENCOUNTER — Other Ambulatory Visit: Payer: Self-pay | Admitting: Family Medicine

## 2022-09-03 ENCOUNTER — Other Ambulatory Visit: Payer: Self-pay | Admitting: Internal Medicine

## 2022-10-06 ENCOUNTER — Other Ambulatory Visit: Payer: Self-pay | Admitting: Internal Medicine

## 2022-10-06 ENCOUNTER — Other Ambulatory Visit: Payer: Self-pay | Admitting: Family Medicine

## 2022-10-06 DIAGNOSIS — N529 Male erectile dysfunction, unspecified: Secondary | ICD-10-CM

## 2022-10-08 ENCOUNTER — Ambulatory Visit: Payer: 59 | Admitting: Internal Medicine

## 2022-10-29 ENCOUNTER — Other Ambulatory Visit: Payer: Self-pay | Admitting: Family Medicine

## 2022-11-05 ENCOUNTER — Other Ambulatory Visit: Payer: Self-pay | Admitting: Cardiology

## 2022-11-10 ENCOUNTER — Encounter: Payer: Self-pay | Admitting: Internal Medicine

## 2022-11-10 ENCOUNTER — Ambulatory Visit: Payer: 59 | Attending: Internal Medicine | Admitting: Internal Medicine

## 2022-11-10 VITALS — BP 122/64 | HR 62 | Ht 69.0 in | Wt 235.0 lb

## 2022-11-10 DIAGNOSIS — I493 Ventricular premature depolarization: Secondary | ICD-10-CM | POA: Diagnosis not present

## 2022-11-10 DIAGNOSIS — I428 Other cardiomyopathies: Secondary | ICD-10-CM | POA: Diagnosis not present

## 2022-11-10 DIAGNOSIS — I10 Essential (primary) hypertension: Secondary | ICD-10-CM | POA: Diagnosis not present

## 2022-11-10 NOTE — Patient Instructions (Signed)
Medication Instructions:  NO CHANGES  *If you need a refill on your cardiac medications before your next appointment, please call your pharmacy*   Testing/Procedures: Your physician has requested that you have an echocardiogram. Echocardiography is a painless test that uses sound waves to create images of your heart. It provides your doctor with information about the size and shape of your heart and how well your heart's chambers and valves are working. This procedure takes approximately one hour. There are no restrictions for this procedure. Please do NOT wear cologne, perfume, aftershave, or lotions (deodorant is allowed). Please arrive 15 minutes prior to your appointment time. Due January 2025   Follow-Up: At [facility], you and your health needs are our priority.  As part of our continuing mission to provide you with exceptional heart care, we have created designated Provider Care Teams.  These Care Teams include your primary Cardiologist (physician) and Advanced Practice Providers (APPs -  Physician Assistants and Nurse Practitioners) who all work together to provide you with the care you need, when you need it.  We recommend signing up for the patient portal called "MyChart".  Sign up information is provided on this After Visit Summary.  MyChart is used to connect with patients for Virtual Visits (Telemedicine).  Patients are able to view lab/test results, encounter notes, upcoming appointments, etc.  Non-urgent messages can be sent to your provider as well.   To learn more about what you can do with MyChart, go to [email or web address].    Your next appointment:   12 month(s)  Provider:   Pixie Casino, MD

## 2022-11-10 NOTE — Progress Notes (Signed)
OFFICE CONSULT NOTE  Chief Complaint:  Follow-up heart failure  Primary Care Physician: Leamon Arnt, MD  HPI:  Gary Berry is a 52 y.o. male who is being seen today for the evaluation of heart failure at the request of Leamon Arnt, MD.  This is a pleasant 52 year old male whose wife is at [facility].  He was referred for evaluation of newly recognized systolic congestive heart failure.  Apparently he had been having some palpitations which were evaluated by his PCP.  An echocardiogram was ordered as well as a Holter monitor.  He was found to have some PVCs on and off his EKG.  The echocardiogram was performed on 04/19/2020 and indicated an LVEF of 35 to 40% with global hypokinesis and moderate LVH, grade 1 diastolic dysfunction and moderate RV systolic dysfunction.  Pressure and IVC were normal with moderate left atrial enlargement.  Despite these findings, Gary Berry has no heart failure symptoms.  He denies worsening shortness of breath, orthopnea, lower extremity edema, chest pain, presyncope, syncopal episodes or any other associated symptoms.  He has had palpitations as mentioned and was noted to have PVCs.  The Holter monitor result is not yet available to determine his burden of PVCs.  According to his wife he also has a history of some alcohol use and was drinking about 3-4 drinks a night which had picked up since the beginning of the Covid pandemic.  Apparently before that it was much less.  He has recently decreased this.  In addition he was described as a very heavy caffeine user and recently has been decreasing that as well.  There is heart disease in his family including his father who had heart disease in his 46s.  He also has type 2 diabetes without insulin use, hypertension and mild obesity.  Recent hemoglobin A1c was 5.3.  Direct LDL in October was 69, however triglycerides were elevated at 273.  06/28/2020  Gary Berry returns today for follow-up.  Overall he  continues to feel well.  He is generally unaware of his heart failure or PVCs.  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  There is also a right bundle branch block which has been stable even prior to starting flecainide.  QTc is 431 ms.  Repeat lab work on [place] showed normal creatinine and blood pressure is excellent at 118/78.  09/27/2020  Gary Berry returns today for follow-up. Overall he is continuing to feel well. In fact he says he is never felt unwell. He saw Dr. Curt Bears who put him on mexiletine. He seems to be tolerating this and has had improvement in his PVCs. I did not detect any on his EKG today. He had an echo earlier this morning which had not been formally read. My review suggest that there is improvement in LV function possibly up to 45 to 50%. The suggest that overall he seems to be responding to therapy. I think there is still blood pressure room to consider up titrating his Entresto.  04/09/2021  Gary Berry is seen today in follow-up.  He underwent PVC ablation in April 2022 after minimal improvement on antiarrhythmic therapy with mexiletine.  This was partially successful however the focus of his PVCs was very near the right coronary artery ostium.  This limited the extent of ablation possible.  Subsequently he has  had persistent and significant PVCs.  He was placed on sotalol and increased up to 160 mg twice daily.  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  Previous echo in December showed some improvement in LVEF up to 40 to 45%.  Blood pressure is low today however and will not allow further up titration of his Entresto.  He is on Victoza however has a low A1c of 5.4.  It might be reasonable to consider switching him from Victoza to an SGLT2 inhibitor such as [place] or  [place].  09/19/2021  Gary Berry returns today for follow-up of heart failure.  Unfortunately his LVEF has not significantly improved.  Seems to be hanging around 40 to 45%.  He just saw Dr. Curt Bears a few days ago and was noted to be in sinus rhythm.  He a monitor showed his PVC burden was low at 5% on combination sotalol and mexiletine.  He is also on Entresto at a moderate dose, limited by blood pressure, also Farxiga 5 mg daily.  He endorses NYHA class I symptoms.  Overall he feels like he is doing well.  At this point Dr. Curt Bears did not feel that his PVCs were still contributing to cardiomyopathy.  11/10/2022  Gary Berry is seen today for follow-up.  He had an echocardiogram last year which showed some further improvement in LVEF up to 45 to 50% although it has not normalized.  He is unaware of any PVCs however we feel that his PVC burden has come down.  He did have a repeat monitor that showed a burden of 5% on a combination of mexiletine and sotalol.  He also is on [place] and on max tolerated doses based on blood pressure.  He continues to have NYHA class I symptoms.  He reports he does not exercise very much.  He was found to have very mild sleep apnea and uses CPAP for this but does not notice any change in his symptomatology.  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   Hyperlipidemia    Hyperlipidemia associated with type 2 diabetes mellitus ([place]) 11/21/2016   Hypertension associated with diabetes ([place]) 11/21/2016   Obesity, Class II, BMI 35-39.9 02/21/5624   Systolic congestive heart failure with reduced left ventricular function, NYHA class 2 ([place]) 04/24/2020   Echocardiogram 04/2020:  Ef 35-40%, global hypokinesis   Type 2 diabetes mellitus without complication, without long-term current use of insulin ([place]) 11/21/2016    Past Surgical History:  Procedure  Laterality Date   HERNIA REPAIR  2016   hard to wake up after sx   PVC ABLATION N/A 02/14/2021   Procedure: PVC ABLATION;  Surgeon: Constance Haw, MD;  Location: [facility];  Service: Cardiovascular;  Laterality: N/A;   RIGHT/LEFT HEART CATH AND CORONARY ANGIOGRAPHY N/A 05/04/2020   Procedure: RIGHT/LEFT HEART CATH AND CORONARY ANGIOGRAPHY;  Surgeon: Leonie Man, MD;  Location: [facility];  Service: Cardiovascular;  Laterality: N/A;   TONSILLECTOMY  1975   VASECTOMY      FAMHx:  Family History  Problem Relation Age of Onset   Diabetes Mother    Hypertension Mother    Atrial fibrillation Father    Colon polyps Father    Sleep apnea Father    Colon cancer Neg Hx    Esophageal cancer Neg Hx  Rectal cancer Neg Hx    Stomach cancer Neg Hx     SOCHx:   reports that he has never smoked. He quit smokeless tobacco use about 24 years ago.  His smokeless tobacco use included chew. He reports that he does not currently use alcohol after a past usage of about 3.0 standard drinks of alcohol per week. He reports that he does not use drugs.  ALLERGIES:  No Known Allergies  ROS: Pertinent items noted in HPI and remainder of comprehensive ROS otherwise negative.  HOME MEDS: Current Outpatient Medications on File Prior to Visit  Medication Sig Dispense Refill   aspirin EC 81 MG tablet Take 81 mg by mouth daily. Swallow whole.     buPROPion (WELLBUTRIN XL) 150 MG 24 hr tablet TAKE 1 TABLET BY MOUTH EVERY DAY 90 tablet 1   cetirizine (ZYRTEC) 10 MG tablet Take 10 mg by mouth daily as needed for allergies.     Cholecalciferol (VITAMIN D) 125 MCG (5000 UT) CAPS Take 5,000 Units by mouth daily.      ENTRESTO 49-51 MG TAKE 1 TABLET BY MOUTH TWICE A DAY 180 tablet 3   FARXIGA 10 MG TABS tablet TAKE 1 TABLET BY MOUTH DAILY BEFORE BREAKFAST. 90 tablet 3   ibuprofen (ADVIL) 200 MG tablet Take 400 mg by mouth every 6 (six) hours as needed for headache or moderate pain.      mexiletine (MEXITIL) 250 MG capsule Take 1 capsule (250 mg total) by mouth 2 (two) times daily. 180 capsule 3   naproxen sodium (ALEVE) 220 MG tablet Take 220 mg by mouth daily as needed (pain).     Omega-3 Fatty Acids (FISH OIL) 1000 MG CAPS Take 2,000 mg by mouth daily.     rosuvastatin (CRESTOR) 20 MG tablet TAKE 1 TABLET BY MOUTH EVERY DAY 90 tablet 1   sotalol (BETAPACE) 160 MG tablet Take 1 tablet (160 mg total) by mouth 2 (two) times daily. 180 tablet 1   tadalafil (CIALIS) 5 MG tablet TAKE 1 TABLET BY MOUTH DAILY AS NEEDED FOR ERECTILE DYSFUNCTION. 90 tablet 1   No current facility-administered medications on file prior to visit.    LABS/IMAGING: No results found for this or any previous visit (from the past 48 hour(s)).  No results found.  LIPID PANEL:    Component Value Date/Time   CHOL 171 05/28/2022 0946   TRIG 269.0 (H) 05/28/2022 0946   HDL 45.40 05/28/2022 0946   CHOLHDL 4 05/28/2022 0946   VLDL 53.8 (H) 05/28/2022 0946   LDLCALC 86 04/01/2021 0900   LDLCALC 73 07/19/2020 0905   LDLDIRECT 75.0 05/28/2022 0946    WEIGHTS: Wt Readings from Last 3 Encounters:  11/10/22 235 lb (106.6 kg)  05/28/22 236 lb 6.4 oz (107.2 kg)  03/31/22 243 lb 6.4 oz (110.4 kg)    VITALS: BP 122/64 (BP Location: Left Arm, Patient Position: Sitting, Cuff Size: Large)   Pulse 62   Ht '5\' 9"'$  (1.753 m)   Wt 235 lb (106.6 kg)   SpO2 96%   BMI 34.70 kg/m   EXAM: General appearance: alert, no distress and mildly obese Neck: no carotid bruit, no JVD and thyroid not enlarged, symmetric, no tenderness/mass/nodules Lungs: clear to auscultation bilaterally Heart: regular rate and rhythm, S1, S2 normal, no murmur, click, rub or gallop Abdomen: soft, non-tender; bowel sounds normal; no masses,  no organomegaly Extremities: extremities normal, atraumatic, no cyanosis or edema Pulses: 2+ and symmetric Skin: Skin color, texture, turgor normal. 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   Gary Berry seems to be stable with NYHA class I symptoms.  His PVC burden has declined significantly down to about 5.9% from 20%, based on a monitor in 2022.  LVEF is not totally improved to normal and remains around 45-50%.  He is on a moderate dose of Entresto however not sure the blood pressure will tolerate increases.  He is on 10 mg [place] daily.  We will continue mexiletine and sotalol.  Plan a repeat echo in 1 year and follow-up at that time.  Could consider cMRI at some point to look for other potential reasons for cardiomyopathy if LVEF does not normalize.  Pixie Casino, MD, [facility], [place] Director of the Advanced Lipid Disorders &  Cardiovascular Risk Reduction Clinic Diplomate of the American Board of Clinical Lipidology Attending Cardiologist  Direct Dial: [phone]  Fax: [phone]  Website:  [email or web address] 11/10/2022, 2:01 PM

## 2022-11-28 ENCOUNTER — Encounter: Payer: Self-pay | Admitting: Family Medicine

## 2022-11-28 ENCOUNTER — Ambulatory Visit (INDEPENDENT_AMBULATORY_CARE_PROVIDER_SITE_OTHER): Payer: 59 | Admitting: Family Medicine

## 2022-11-28 VITALS — BP 110/78 | HR 59 | Temp 97.8°F | Ht 69.0 in | Wt 232.2 lb

## 2022-11-28 DIAGNOSIS — I152 Hypertension secondary to endocrine disorders: Secondary | ICD-10-CM

## 2022-11-28 DIAGNOSIS — E1159 Type 2 diabetes mellitus with other circulatory complications: Secondary | ICD-10-CM | POA: Diagnosis not present

## 2022-11-28 DIAGNOSIS — I5022 Chronic systolic (congestive) heart failure: Secondary | ICD-10-CM | POA: Diagnosis not present

## 2022-11-28 DIAGNOSIS — E119 Type 2 diabetes mellitus without complications: Secondary | ICD-10-CM

## 2022-11-28 DIAGNOSIS — F4323 Adjustment disorder with mixed anxiety and depressed mood: Secondary | ICD-10-CM | POA: Diagnosis not present

## 2022-11-28 LAB — POCT GLYCOSYLATED HEMOGLOBIN (HGB A1C): Hemoglobin A1C: 5.3 % (ref 4.0–5.6)

## 2022-11-28 NOTE — Progress Notes (Signed)
Subjective  CC:  Chief Complaint  Patient presents with   Hypertension   Diabetes    HPI: Gary Berry is a 52 y.o. male who presents to the office today for follow up of diabetes and problems listed above in the chief complaint.  Diabetes follow up: His diabetic control is reported as Unchanged. On farxiga for heart failure: likely has resolved his diabetes.  He denies exertional CP or SOB or symptomatic hypoglycemia. He denies foot sores or paresthesias.  CHF and nonischemic idiopathic cardiomyopathy: reviewed echo and cards eval from January; stable. On antiarrhythmics and PVC burden has lessened. He remains asymptomatic. Bp is holding on meds. No cp. Stopped aspirin.  Mood is well controlled on wellbutrin xr 150  Wt Readings from Last 3 Encounters:  11/28/22 232 lb 3.2 oz (105.3 kg)  11/10/22 235 lb (106.6 kg)  05/28/22 236 lb 6.4 oz (107.2 kg)    BP Readings from Last 3 Encounters:  11/28/22 110/78  11/10/22 122/64  05/28/22 118/80    Assessment  1. Type 2 diabetes mellitus without complication, without long-term current use of insulin ([place])   2. Adjustment reaction with anxiety and depression   3. Hypertension associated with diabetes ([place])   4. Chronic systolic congestive heart failure, NYHA class 1 ([place])      Plan  Diabetes is currently very well controlled. On farxiga and diet. No changes CHF: controlled on meds.  Wellbutrin xl for mood. Doing well.   Follow up: 6 mo for cpe. Orders Placed This Encounter  Procedures   POCT HgB A1C   No orders of the defined types were placed in this encounter.     Immunization History  Administered Date(s) Administered   Influenza Inj Mdck Quad Pf 08/01/2017   Influenza Inj Mdck Quad With Preservative 08/02/2021   Influenza, Seasonal, Injecte, Preservative Fre 08/01/2017   Influenza,inj,Quad PF,6+ Mos 12/06/2015, 11/21/2016, 08/22/2018, 07/19/2020, 08/09/2022   Influenza-Unspecified 08/22/2018, 08/01/2019   Moderna  Covid-19 Vaccine Bivalent Booster 64yr & up 08/09/2022   PFIZER Comirnaty(Gray Top)Covid-19 Tri-Sucrose Vaccine 02/08/2021   PFIZER(Purple Top)SARS-COV-2 Vaccination 12/30/2019, 01/20/2020, 08/18/2020   Pfizer Covid-19 Vaccine Bivalent Booster 131yr& up 08/02/2021   Pneumococcal Polysaccharide-23 05/25/2017   Tdap 12/18/2011, 05/28/2022   Zoster Recombinat (Shingrix) 09/28/2020, 12/27/2020    Diabetes Related Lab Review: Lab Results  Component Value Date   HGBA1C 5.3 11/28/2022   HGBA1C 6.0 05/28/2022   HGBA1C 5.6 11/27/2021    Lab Results  Component Value Date   MICROALBUR <0.7 05/28/2022   Lab Results  Component Value Date   CREATININE 1.08 05/28/2022   BUN 15 05/28/2022   NA 136 05/28/2022   K 4.9 05/28/2022   CL 103 05/28/2022   CO2 30 05/28/2022   Lab Results  Component Value Date   CHOL 171 05/28/2022   CHOL 159 04/01/2021   CHOL 142 07/19/2020   Lab Results  Component Value Date   HDL 45.40 05/28/2022   HDL 44.90 04/01/2021   HDL 47 07/19/2020   Lab Results  Component Value Date   LDLCALC 86 04/01/2021   LDLCALC 73 07/19/2020   LDLCALC 68 02/21/2019   Lab Results  Component Value Date   TRIG 269.0 (H) 05/28/2022   TRIG 139.0 04/01/2021   TRIG 137 07/19/2020   Lab Results  Component Value Date   CHOLHDL 4 05/28/2022   CHOLHDL 4 04/01/2021   CHOLHDL 3.0 07/19/2020   Lab Results  Component Value Date   LDLDIRECT 75.0 05/28/2022  LDLDIRECT 69.0 08/02/2019   The 10-year ASCVD risk score (Arnett DK, et al., 2019) is: 7.8%   Values used to calculate the score:     Age: 40 years     Sex: Male     Is Non-Hispanic African American: No     Diabetic: Yes     Tobacco smoker: No     Systolic Blood Pressure: [id number] mmHg     Is BP treated: Yes     HDL Cholesterol: 45.4 mg/dL     Total Cholesterol: 171 mg/dL I have reviewed the PMH, Fam and Soc history. Patient Active Problem List   Diagnosis Date Noted   Nonischemic cardiomyopathy ([place]) 05/28/2022     Priority: High   PVC (premature ventricular contraction) 02/14/2021    Priority: High   Frequent PVCs 05/04/2020    Priority: High   Systolic congestive heart failure, NYHA class 1 ([place]) 04/24/2020    Priority: High    Echocardiogram 04/2020:  Ef 35-40%, global hypokinesis    Type 2 diabetes mellitus without complication, without long-term current use of insulin ([place]) 11/21/2016    Priority: High   Hypertension associated with diabetes ([place]) 11/21/2016    Priority: High   Hyperlipidemia associated with type 2 diabetes mellitus ([place]) 11/21/2016    Priority: High   Adjustment reaction with anxiety and depression 05/28/2022    Priority: Medium     Responded to wellbutrin 2023    Obesity, Class II, BMI 35-39.9 11/21/2016    Priority: Medium    Umbilical hernia [id number]    Priority: Medium    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 He quit smokeless tobacco use about 25 years ago.  His smokeless tobacco use included chew. He reports that he does not currently use alcohol after a past usage of about 3.0 standard drinks of alcohol per week. He reports that he does not use drugs.  Review of Systems: Ophthalmic: negative for eye pain, loss of vision or double vision Cardiovascular: negative for chest pain Respiratory: negative for SOB or persistent cough Gastrointestinal: negative for abdominal pain Genitourinary: negative for dysuria or gross hematuria MSK: negative for foot lesions Neurologic: negative for weakness or gait disturbance  Objective  Vitals: BP 110/78   Pulse (!) 59   Temp 97.8 F (36.6 C)   Ht 5' 9"$  (1.753 m)   Wt 232 lb 3.2 oz (105.3 kg)   SpO2 98%   BMI 34.29 kg/m  General: well appearing, no acute distress  Psych:  Alert and oriented, normal mood and affect HEENT:  Normocephalic, atraumatic,  supple neck  Cardiovascular:  Nl S1 and S2, RRR without murmur,  gallop or rub. no edema Respiratory:  Good breath sounds bilaterally, CTAB with normal effort, no rales     Diabetic education: ongoing education regarding chronic disease management for diabetes was given today. We continue to reinforce the ABC's of diabetic management: A1c (<7 or 8 dependent upon patient), tight blood pressure control, and cholesterol management with goal LDL < 100 minimally. We discuss diet strategies, exercise recommendations, medication options and possible side effects. At each visit, we review recommended immunizations and preventive care recommendations for diabetics and stress that good diabetic control can prevent other problems. See below for this patient's data.   Commons side effects, risks, benefits, and alternatives for medications and treatment plan prescribed today were discussed, and the patient expressed understanding of  the given instructions. Patient is instructed to call or message via MyChart if he/she has any questions or concerns regarding our treatment plan. No barriers to understanding were identified. We discussed Red Flag symptoms and signs in detail. Patient expressed understanding regarding what to do in case of urgent or emergency type symptoms.  Medication list was reconciled, printed and provided to the patient in AVS. Patient instructions and summary information was reviewed with the patient as documented in the AVS. This note was prepared with assistance of Dragon voice recognition software. Occasional wrong-word or sound-a-like substitutions may have occurred due to the inherent limitations of voice recognition software

## 2023-03-05 ENCOUNTER — Other Ambulatory Visit: Payer: Self-pay | Admitting: Family Medicine

## 2023-03-09 ENCOUNTER — Encounter: Payer: Self-pay | Admitting: *Deleted

## 2023-03-10 ENCOUNTER — Telehealth (INDEPENDENT_AMBULATORY_CARE_PROVIDER_SITE_OTHER): Payer: 59 | Admitting: Adult Health

## 2023-03-10 DIAGNOSIS — G4733 Obstructive sleep apnea (adult) (pediatric): Secondary | ICD-10-CM

## 2023-03-10 NOTE — Progress Notes (Signed)
PATIENT: Gary Berry DOB: [birth year]  REASON FOR VISIT: follow up HISTORY FROM: patient PRIMARY NEUROLOGIST: Dr. Frances Furbish  Virtual Visit via Video Note  I connected with Gary Berry on 03/10/23 at  1:00 PM EDT by a video enabled telemedicine application located remotely at [facility] and verified that I am speaking with the correct person using two identifiers who was located at their own home.   I discussed the limitations of evaluation and management by telemedicine and the availability of in person appointments. The patient expressed understanding and agreed to proceed.   PATIENT: Gary Berry DOB: 24-May-1971  REASON FOR VISIT: follow up HISTORY FROM: patient  HISTORY OF PRESENT ILLNESS: Today 03/10/23:  Gary Berry is a 52 y.o. male with a history of OSA on CPAP. Returns today for follow-up.  He reports that the CPAP is working well for him.  He denies any new issues.  Reports that he changes out his supplies regularly.  Download is below       REVIEW OF SYSTEMS: Out of a complete 14 system review of symptoms, the patient complains only of the following symptoms, and all other reviewed systems are negative.  ALLERGIES: No Known Allergies  HOME MEDICATIONS: Outpatient Medications Prior to Visit  Medication Sig Dispense Refill   buPROPion (WELLBUTRIN XL) 150 MG 24 hr tablet TAKE 1 TABLET BY MOUTH EVERY DAY 90 tablet 1   cetirizine (ZYRTEC) 10 MG tablet Take 10 mg by mouth daily as needed for allergies.     Cholecalciferol (VITAMIN D) 125 MCG (5000 UT) CAPS Take 5,000 Units by mouth daily.      ENTRESTO 49-51 MG TAKE 1 TABLET BY MOUTH TWICE A DAY 180 tablet 3   FARXIGA 10 MG TABS tablet TAKE 1 TABLET BY MOUTH DAILY BEFORE BREAKFAST. 90 tablet 3   ibuprofen (ADVIL) 200 MG tablet Take 400 mg by mouth every 6 (six) hours as needed for headache or moderate pain.     mexiletine (MEXITIL) 250 MG capsule Take 1 capsule (250 mg total) by mouth 2 (two)  times daily. 180 capsule 3   naproxen sodium (ALEVE) 220 MG tablet Take 220 mg by mouth daily as needed (pain).     Omega-3 Fatty Acids (FISH OIL) 1000 MG CAPS Take 2,000 mg by mouth daily.     rosuvastatin (CRESTOR) 20 MG tablet TAKE 1 TABLET BY MOUTH EVERY DAY 90 tablet 1   sotalol (BETAPACE) 160 MG tablet Take 1 tablet (160 mg total) by mouth 2 (two) times daily. 180 tablet 1   tadalafil (CIALIS) 5 MG tablet TAKE 1 TABLET BY MOUTH DAILY AS NEEDED FOR ERECTILE DYSFUNCTION. 90 tablet 1   No facility-administered medications prior to visit.    PAST MEDICAL HISTORY: 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   Hyperlipidemia    Hyperlipidemia associated with type 2 diabetes mellitus (HCC) 11/21/2016   Hypertension associated with diabetes (HCC) 11/21/2016   Obesity, Class II, BMI 35-39.9 11/21/2016   Systolic congestive heart failure with reduced left ventricular function, NYHA class 2 (HCC) 04/24/2020   Echocardiogram 04/2020:  Ef 35-40%, global hypokinesis   Type 2 diabetes mellitus without complication, without long-term current use of insulin (HCC) 11/21/2016    PAST SURGICAL HISTORY: Past Surgical History:  Procedure Laterality Date   HERNIA REPAIR  2016   hard to wake up after sx   PVC  ABLATION N/A 02/14/2021   Procedure: PVC ABLATION;  Surgeon: Regan Lemming, MD;  Location: [facility] INVASIVE CV LAB;  Service: Cardiovascular;  Laterality: N/A;   RIGHT/LEFT HEART CATH AND CORONARY ANGIOGRAPHY N/A 05/04/2020   Procedure: RIGHT/LEFT HEART CATH AND CORONARY ANGIOGRAPHY;  Surgeon: Marykay Lex, MD;  Location: [facility] INVASIVE CV LAB;  Service: Cardiovascular;  Laterality: N/A;   TONSILLECTOMY  1975   VASECTOMY      FAMILY HISTORY: Family History  Problem Relation Age of Onset   Diabetes Mother    Hypertension Mother    Atrial fibrillation Father    Colon polyps Father     Sleep apnea Father    Colon cancer Neg Hx    Esophageal cancer Neg Hx    Rectal cancer Neg Hx    Stomach cancer Neg Hx     SOCIAL HISTORY: Social History   Socioeconomic History   Marital status: Married    Spouse name: Quillian Quince, MD   Number of children: 1   Years of education: BA   Highest education level: Not on file  Occupational History   Occupation: [employer]    Employer: [employer]  Tobacco Use   Smoking status: Never   Smokeless tobacco: Former    Types: Chew    Quit date: 11/21/1997  Vaping Use   Vaping Use: Never used  Substance and Sexual Activity   Alcohol use: Not Currently    Alcohol/week: 3.0 standard drinks of alcohol    Types: 3 Cans of beer per week    Comment: social   Drug use: No   Sexual activity: Yes    Partners: Female  Other Topics Concern   Not on file  Social History Narrative   Caffeine use: 2 cups coffee per day   Caffeine free soda      Right handed    Social Determinants of Health   Financial Resource Strain: Not on file  Food Insecurity: Not on file  Transportation Needs: Not on file  Physical Activity: Not on file  Stress: Not on file  Social Connections: Not on file  Intimate Partner Violence: Not on file      PHYSICAL EXAM Generalized: Well developed, in no acute distress   Neurological examination  Mentation: Alert oriented to time, place, history taking. Follows all commands speech and language fluent Cranial nerve II-XII: Facial symmetry noted DIAGNOSTIC DATA (LABS, IMAGING, TESTING) - I reviewed patient records, labs, notes, testing and imaging myself where available.  Lab Results  Component Value Date   WBC 6.5 05/28/2022   HGB 16.9 05/28/2022   HCT 50.8 05/28/2022   MCV 96.5 05/28/2022   PLT 228.0 05/28/2022      Component Value Date/Time   NA 136 05/28/2022 0946   NA 140 09/17/2021 1011   K 4.9 05/28/2022 0946   CL 103 05/28/2022 0946   CO2 30 05/28/2022 0946   GLUCOSE 108 (H) 05/28/2022 0946   BUN 15  05/28/2022 0946   BUN 15 09/17/2021 1011   CREATININE 1.08 05/28/2022 0946   CALCIUM 10.0 05/28/2022 0946   PROT 7.4 05/28/2022 0946   ALBUMIN 4.8 05/28/2022 0946   AST 21 05/28/2022 0946   ALT 22 05/28/2022 0946   ALKPHOS 63 05/28/2022 0946   BILITOT 0.6 05/28/2022 0946   GFRNONAA >60 02/15/2021 0400   GFRAA 97 11/16/2020 0939   Lab Results  Component Value Date   CHOL 171 05/28/2022   HDL 45.40 05/28/2022   LDLCALC 86 04/01/2021  LDLDIRECT 75.0 05/28/2022   TRIG 269.0 (H) 05/28/2022   CHOLHDL 4 05/28/2022   Lab Results  Component Value Date   HGBA1C 5.3 11/28/2022   Lab Results  Component Value Date   VITAMINB12 748 02/21/2019   Lab Results  Component Value Date   TSH 1.18 05/28/2022      ASSESSMENT AND PLAN 52 y.o. year old male  has a past medical history of Chronic right shoulder pain, requiring injection prn (03/20/2017), ED (erectile dysfunction) of organic origin (11/21/2016), Hyperlipidemia, Hyperlipidemia associated with type 2 diabetes mellitus (HCC) (11/21/2016), Hypertension associated with diabetes (HCC) (11/21/2016), Obesity, Class II, BMI 35-39.9 (11/21/2016), Systolic congestive heart failure with reduced left ventricular function, NYHA class 2 (HCC) (04/24/2020), and Type 2 diabetes mellitus without complication, without long-term current use of insulin (HCC) (11/21/2016). here with:  OSA on CPAP  CPAP compliance excellent Residual AHI is good Encouraged patient to continue using CPAP nightly and > 4 hours each night F/U in 1 year or sooner if needed   Butch Penny, MSN, NP-C 03/10/2023, 12:59 PM [facility] [address] [place], [place] [address] [phone]

## 2023-03-21 ENCOUNTER — Telehealth: Payer: 59 | Admitting: Physician Assistant

## 2023-03-21 DIAGNOSIS — W5501XA Bitten by cat, initial encounter: Secondary | ICD-10-CM | POA: Diagnosis not present

## 2023-03-21 DIAGNOSIS — S0181XA Laceration without foreign body of other part of head, initial encounter: Secondary | ICD-10-CM | POA: Diagnosis not present

## 2023-03-22 MED ORDER — AMOXICILLIN-POT CLAVULANATE 875-125 MG PO TABS: 1.0000 | ORAL_TABLET | Freq: Two times a day (BID) | ORAL | 0 refills | Status: AC

## 2023-03-22 NOTE — Progress Notes (Signed)
E-Visit for Simple Cut/Laceration  We are sorry that you have had an injury. Here is how we plan to help!  I have prescribed Augmentin, 875 mg by mouth twice daily for two days given that your wounds are from a cat bite.    HOME CARE: Clean the cut or scrape - Wash it well with soap and water. * avoid using hydrogen peroxide which may cause tissue damage, or impede wound healing.  Stop the bleeding - If your cut or scrape is bleeding, press a clean cloth or bandage firmly on the area for 20 minutes. You can also help slow the bleeding by holding the cut above the level of your heart.   Put a thin layer of Bacitracin antibiotic ointment on the cut or scrape. (this can be purchased at any local pharmacy- ask your pharmacist if you need assistance)   Cover the cut or scrape with a bandage or gauze. Keep the bandage clean and dry. Change the bandage 1 to 2 times every day until your cut or scrape heals.   Watch for signs that your cut or scrape is infected (redness, drainage, pain, warmth, swelling or fever)  Over the next 48 hours your wound should start to improve with less pain, less swelling and less redness. If you should develop increasing pain, swelling, redness, fever, pus from the wound you should be seen immediately to make sure this is not becoming infected.   WOUND CARE: Please keep a layer of antibiotic ointment (bacitracin preferred) on this wound at least twice a day for the next seven days and keep a sterile dressing over top of it. You may gently clean the wound with warm soap and water between dressing changes.  We strongly recommend that you have a medical provider reevaluate your wound within 2 to 3 days in person to make sure that it is healing appropriately.  Thank you for choosing an e-visit.  Your e-visit answers were reviewed by a board certified advanced clinical practitioner to complete your personal care plan. Depending upon the condition, your plan could have  included both over the counter or prescription medications.  Please review your pharmacy choice. Make sure the pharmacy is open so you can pick up prescription now. If there is a problem, you may contact your provider through [employer] and have the prescription routed to another pharmacy.  Your safety is important to [place]. If you have drug allergies check your prescription carefully.   For the next 24 hours you can use MyChart to ask questions about today's visit, request a non-urgent call back, or ask for a work or school excuse. You will get an email in the next two days asking about your experience. I hope that your e-visit has been valuable and will speed your recovery.  I have spent 5 minutes in review of e-visit questionnaire, review and updating patient chart, medical decision making and response to patient.   Tylene Fantasia Ward, PA-C

## 2023-03-23 ENCOUNTER — Other Ambulatory Visit: Payer: Self-pay | Admitting: Cardiology

## 2023-04-15 ENCOUNTER — Encounter (INDEPENDENT_AMBULATORY_CARE_PROVIDER_SITE_OTHER): Payer: 59 | Admitting: Family Medicine

## 2023-04-15 DIAGNOSIS — M67449 Ganglion, unspecified hand: Secondary | ICD-10-CM

## 2023-04-16 DIAGNOSIS — M67449 Ganglion, unspecified hand: Secondary | ICD-10-CM | POA: Diagnosis not present

## 2023-04-17 NOTE — Telephone Encounter (Signed)
A total of 6 minutes were spent by me to personally review the patient-generated inquiry, review patient records and data pertinent to assessment of the patient's problem, develop a management plan including generation of prescriptions and/or orders, and on subsequent communication with the patient through secure the MyChart portal service. There is no separately reported E/M service related to this service in the past 7 days nor does the patient have an upcoming soonest available appointment for this issue. This work was completed in less than 7 days.       

## 2023-04-17 NOTE — Addendum Note (Signed)
Addended by: Asencion Partridge on: 04/17/2023 12:21 PM   Modules accepted: Orders

## 2023-04-27 ENCOUNTER — Other Ambulatory Visit: Payer: Self-pay | Admitting: Family Medicine

## 2023-05-04 ENCOUNTER — Other Ambulatory Visit: Payer: Self-pay | Admitting: Family Medicine

## 2023-05-04 DIAGNOSIS — N529 Male erectile dysfunction, unspecified: Secondary | ICD-10-CM

## 2023-05-20 ENCOUNTER — Encounter (INDEPENDENT_AMBULATORY_CARE_PROVIDER_SITE_OTHER): Payer: Self-pay

## 2023-05-28 ENCOUNTER — Telehealth: Payer: Self-pay | Admitting: *Deleted

## 2023-05-28 ENCOUNTER — Other Ambulatory Visit: Payer: Self-pay | Admitting: Orthopedic Surgery

## 2023-05-28 ENCOUNTER — Telehealth: Payer: Self-pay | Admitting: Internal Medicine

## 2023-05-28 NOTE — Telephone Encounter (Signed)
Spoke with patient and scheduled him for a pre op telehealth appt on 06/12/23 at 9:00 AM. Will route to requesting surgeons office to make them aware.

## 2023-05-28 NOTE — Telephone Encounter (Signed)
  Patient Consent for Virtual Visit        Gary Berry has provided verbal consent on 05/28/2023 for a virtual visit (video or telephone).   CONSENT FOR VIRTUAL VISIT FOR:  Gary Berry  By participating in this virtual visit I agree to the following:  I hereby voluntarily request, consent and authorize [facility] and its employed or contracted physicians, physician assistants, nurse practitioners or other licensed health care professionals (the Practitioner), to provide me with telemedicine health care services (the "Services") as deemed necessary by the treating Practitioner. I acknowledge and consent to receive the Services by the Practitioner via telemedicine. I understand that the telemedicine visit will involve communicating with the Practitioner through live audiovisual communication technology and the disclosure of certain medical information by electronic transmission. I acknowledge that I have been given the opportunity to request an in-person assessment or other available alternative prior to the telemedicine visit and am voluntarily participating in the telemedicine visit.  I understand that I have the right to withhold or withdraw my consent to the use of telemedicine in the course of my care at any time, without affecting my right to future care or treatment, and that the Practitioner or I may terminate the telemedicine visit at any time. I understand that I have the right to inspect all information obtained and/or recorded in the course of the telemedicine visit and may receive copies of available information for a reasonable fee.  I understand that some of the potential risks of receiving the Services via telemedicine include:  Delay or interruption in medical evaluation due to technological equipment failure or disruption; Information transmitted may not be sufficient (e.g. poor resolution of images) to allow for appropriate medical decision making by the Practitioner;  and/or  In rare instances, security protocols could fail, causing a breach of personal health information.  Furthermore, I acknowledge that it is my responsibility to provide information about my medical history, conditions and care that is complete and accurate to the best of my ability. I acknowledge that Practitioner's advice, recommendations, and/or decision may be based on factors not within their control, such as incomplete or inaccurate data provided by me or distortions of diagnostic images or specimens that may result from electronic transmissions. I understand that the practice of medicine is not an exact science and that Practitioner makes no warranties or guarantees regarding treatment outcomes. I acknowledge that a copy of this consent can be made available to me via my patient portal [facility] MyChart), or I can request a printed copy by calling the office of [facility].    I understand that my insurance will be billed for this visit.   I have read or had this consent read to me. I understand the contents of this consent, which adequately explains the benefits and risks of the Services being provided via telemedicine.  I have been provided ample opportunity to ask questions regarding this consent and the Services and have had my questions answered to my satisfaction. I give my informed consent for the services to be provided through the use of telemedicine in my medical care

## 2023-05-28 NOTE — Telephone Encounter (Signed)
   Name: Gary Berry  DOB: 27-Mar-1971  MRN: [id number]  Primary Cardiologist: Chrystie Nose, MD   Preoperative team, please contact this patient and set up a phone call appointment for further preoperative risk assessment. Please obtain consent and complete medication review. Thank you for your help.Last office visit, 11/10/2022  I confirm that guidance regarding antiplatelet and oral anticoagulation therapy has been completed and, if necessary, noted below.  Per protocol, If patient needs intubation will need to hold Farxiga for 72 hours prior to procedure.    Joni Reining, NP 05/28/2023, 1:28 PM [facility]

## 2023-05-28 NOTE — Telephone Encounter (Signed)
Left msg for pt to return call.

## 2023-05-28 NOTE — Telephone Encounter (Signed)
   Pre-operative Risk Assessment    Patient Name: Gary Berry  DOB: 1971-05-12 MRN: [id number]      Request for Surgical Clearance    Procedure:   Right Long Finger Excision Mucous Cyst and Debridement Distal Interphalangeal Joint Possible Rotation Flap   Date of Surgery:  Clearance 07/10/23                                 Surgeon:  Dr. Betha Loa  Surgeon's Group or Practice Name:  [facility] Phone number:  [phone] Fax number:  [phone]   Type of Clearance Requested:   - Medical    Type of Anesthesia:   Choice   Additional requests/questions:    Minna Antis   05/28/2023, 8:44 AM

## 2023-06-08 ENCOUNTER — Other Ambulatory Visit: Payer: Self-pay | Admitting: Family Medicine

## 2023-06-10 ENCOUNTER — Telehealth: Payer: 59

## 2023-06-12 ENCOUNTER — Ambulatory Visit: Payer: 59 | Attending: Interventional Cardiology

## 2023-06-12 DIAGNOSIS — Z01810 Encounter for preprocedural cardiovascular examination: Secondary | ICD-10-CM

## 2023-06-12 NOTE — Progress Notes (Signed)
Virtual Visit via Telephone Note   Because of Gary Berry's co-morbid illnesses, he is at least at moderate risk for complications without adequate follow up.  This format is felt to be most appropriate for this patient at this time.  The patient did not have access to video technology/had technical difficulties with video requiring transitioning to audio format only (telephone).  All issues noted in this document were discussed and addressed.  No physical exam could be performed with this format.  Please refer to the patient's chart for his consent to telehealth for [facility].  Evaluation Performed:  Preoperative cardiovascular risk assessment _____________   Date:  06/12/2023   Patient ID:  Gary Berry, DOB [birth year], MRN [id number] Patient Location:  Home Provider location:   Office  Primary Care Provider:  Willow Ora, MD Primary Cardiologist:  Chrystie Nose, MD  Chief Complaint / Patient Profile   52 y.o. y/o male with a h/o HFrEF, HLD, DM type II, OSA (on CPAP), HTN, PVCs, NICM who is pending right finger procedure and presents today for telephonic preoperative cardiovascular risk assessment.  History of Present Illness    Gary Berry is a 52 y.o. male who presents via audio/video conferencing for a telehealth visit today.  Pt was last seen in cardiology clinic on 11/10/2022 by Dr. Rennis Golden. At that time Gary Berry was doing well and stable.  The patient is now pending procedure as outlined above. Since his last visit, he has been doing well with no new cardiac complaints.  He continues to stay active and denied any changes in his health since previous follow-up.   He denies chest pain, shortness of breath, lower extremity edema, fatigue, palpitations, melena, hematuria, hemoptysis, diaphoresis, weakness, presyncope, syncope, orthopnea, and PND.     Past Medical History    Past Medical History:  Diagnosis Date   Chronic right shoulder pain, requiring  injection prn 03/20/2017   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   Hyperlipidemia    Hyperlipidemia associated with type 2 diabetes mellitus (HCC) 11/21/2016   Hypertension associated with diabetes (HCC) 11/21/2016   Obesity, Class II, BMI 35-39.9 11/21/2016   Systolic congestive heart failure with reduced left ventricular function, NYHA class 2 (HCC) 04/24/2020   Echocardiogram 04/2020:  Ef 35-40%, global hypokinesis   Type 2 diabetes mellitus without complication, without long-term current use of insulin (HCC) 11/21/2016   Past Surgical History:  Procedure Laterality Date   HERNIA REPAIR  2016   hard to wake up after sx   PVC ABLATION N/A 02/14/2021   Procedure: PVC ABLATION;  Surgeon: Regan Lemming, MD;  Location: [facility] INVASIVE CV LAB;  Service: Cardiovascular;  Laterality: N/A;   RIGHT/LEFT HEART CATH AND CORONARY ANGIOGRAPHY N/A 05/04/2020   Procedure: RIGHT/LEFT HEART CATH AND CORONARY ANGIOGRAPHY;  Surgeon: Marykay Lex, MD;  Location: [facility] INVASIVE CV LAB;  Service: Cardiovascular;  Laterality: N/A;   TONSILLECTOMY  1975   VASECTOMY      Allergies  No Known Allergies  Home Medications    Prior to Admission medications   Medication Sig Start Date End Date Taking? Authorizing Provider  buPROPion (WELLBUTRIN XL) 150 MG 24 hr tablet TAKE 1 TABLET BY MOUTH EVERY DAY 06/08/23   Worthy Rancher B, FNP  cetirizine (ZYRTEC) 10 MG tablet Take 10 mg by mouth daily as needed for allergies.    [provider]  Cholecalciferol (VITAMIN D) 125 MCG (5000 UT) CAPS Take 5,000 Units by mouth daily.  [provider]  ENTRESTO 49-51 MG TAKE 1 TABLET BY MOUTH TWICE A DAY 10/08/22   Hilty, Lisette Abu, MD  FARXIGA 10 MG TABS tablet TAKE 1 TABLET BY MOUTH DAILY BEFORE BREAKFAST. 09/03/22   Hilty, Lisette Abu, MD  ibuprofen (ADVIL) 200 MG tablet Take 400 mg by mouth every 6 (six) hours as needed for headache or moderate pain.    [provider]  mexiletine (MEXITIL)  250 MG capsule Take 1 capsule (250 mg total) by mouth 2 (two) times daily. 11/05/22   Camnitz, Andree Coss, MD  naproxen sodium (ALEVE) 220 MG tablet Take 220 mg by mouth daily as needed (pain).    [provider]  Omega-3 Fatty Acids (FISH OIL) 1000 MG CAPS Take 2,000 mg by mouth daily.    [provider]  rosuvastatin (CRESTOR) 20 MG tablet TAKE 1 TABLET BY MOUTH EVERY DAY 04/27/23   Willow Ora, MD  sotalol (BETAPACE) 160 MG tablet TAKE 1 TABLET BY MOUTH 2 TIMES DAILY. 03/23/23   Camnitz, Will Daphine Deutscher, MD  tadalafil (CIALIS) 5 MG tablet TAKE 1 TABLET BY MOUTH DAILY AS NEEDED FOR ERECTILE DYSFUNCTION. 05/04/23   Willow Ora, MD    Physical Exam    Vital Signs:  Gary Berry does not have vital signs available for review today.  Given telephonic nature of communication, physical exam is limited. AAOx3. NAD. Normal affect.  Speech and respirations are unlabored.  Accessory Clinical Findings    None  Assessment & Plan    1.  Preoperative Cardiovascular Risk Assessment: -Patient's RCRI score is 0.9%  The patient affirms he has been doing well without any new cardiac symptoms. They are able to achieve 7 METS without cardiac limitations. Therefore, based on ACC/AHA guidelines, the patient would be at acceptable risk for the planned procedure without further cardiovascular testing. The patient was advised that if he develops new symptoms prior to surgery to contact our office to arrange for a follow-up visit, and he verbalized understanding.   The patient was advised that if he develops new symptoms prior to surgery to contact our office to arrange for a follow-up visit, and he verbalized understanding.   A copy of this note will be routed to requesting surgeon.  Time:   Today, I have spent 7 minutes with the patient with telehealth technology discussing medical history, symptoms, and management plan.     Napoleon Form, Leodis Rains, NP  06/12/2023, 6:59 AM

## 2023-06-19 ENCOUNTER — Encounter: Payer: Self-pay | Admitting: Family Medicine

## 2023-06-19 ENCOUNTER — Ambulatory Visit (INDEPENDENT_AMBULATORY_CARE_PROVIDER_SITE_OTHER): Payer: 59 | Admitting: Family Medicine

## 2023-06-19 VITALS — BP 110/70 | HR 75 | Temp 97.8°F | Ht 69.0 in | Wt 230.2 lb

## 2023-06-19 DIAGNOSIS — Z7984 Long term (current) use of oral hypoglycemic drugs: Secondary | ICD-10-CM

## 2023-06-19 DIAGNOSIS — I493 Ventricular premature depolarization: Secondary | ICD-10-CM

## 2023-06-19 DIAGNOSIS — E1169 Type 2 diabetes mellitus with other specified complication: Secondary | ICD-10-CM

## 2023-06-19 DIAGNOSIS — I152 Hypertension secondary to endocrine disorders: Secondary | ICD-10-CM

## 2023-06-19 DIAGNOSIS — I5022 Chronic systolic (congestive) heart failure: Secondary | ICD-10-CM

## 2023-06-19 DIAGNOSIS — F4323 Adjustment disorder with mixed anxiety and depressed mood: Secondary | ICD-10-CM

## 2023-06-19 DIAGNOSIS — E1159 Type 2 diabetes mellitus with other circulatory complications: Secondary | ICD-10-CM | POA: Diagnosis not present

## 2023-06-19 DIAGNOSIS — E785 Hyperlipidemia, unspecified: Secondary | ICD-10-CM

## 2023-06-19 DIAGNOSIS — Z23 Encounter for immunization: Secondary | ICD-10-CM | POA: Diagnosis not present

## 2023-06-19 DIAGNOSIS — Z0000 Encounter for general adult medical examination without abnormal findings: Secondary | ICD-10-CM | POA: Diagnosis not present

## 2023-06-19 DIAGNOSIS — G4733 Obstructive sleep apnea (adult) (pediatric): Secondary | ICD-10-CM

## 2023-06-19 DIAGNOSIS — E119 Type 2 diabetes mellitus without complications: Secondary | ICD-10-CM

## 2023-06-19 LAB — COMPREHENSIVE METABOLIC PANEL
ALT: 15 U/L (ref 0–53)
AST: 20 U/L (ref 0–37)
Albumin: 4.4 g/dL (ref 3.5–5.2)
Alkaline Phosphatase: 65 U/L (ref 39–117)
BUN: 17 mg/dL (ref 6–23)
CO2: 26 mEq/L (ref 19–32)
Calcium: 9.7 mg/dL (ref 8.4–10.5)
Chloride: 103 mEq/L (ref 96–112)
Creatinine, Ser: 1.22 mg/dL (ref 0.40–1.50)
GFR: 67.19 mL/min (ref >60.00)
Glucose, Bld: 141 mg/dL — ABNORMAL HIGH (ref 70–99)
Potassium: 4.1 mEq/L (ref 3.5–5.1)
Sodium: 138 mEq/L (ref 135–145)
Total Bilirubin: 0.5 mg/dL (ref 0.2–1.2)
Total Protein: 7 g/dL (ref 6.0–8.3)

## 2023-06-19 LAB — CBC WITH DIFFERENTIAL/PLATELET
Basophils Absolute: 0.1 10*3/uL (ref 0.0–0.1)
Basophils Relative: 1.9 % (ref 0.0–3.0)
Eosinophils Absolute: 0.1 10*3/uL (ref 0.0–0.7)
Eosinophils Relative: 2.1 % (ref 0.0–5.0)
HCT: 51.5 % (ref 39.0–52.0)
Hemoglobin: 16.7 g/dL (ref 13.0–17.0)
Lymphocytes Relative: 31.9 % (ref 12.0–46.0)
Lymphs Abs: 2 10*3/uL (ref 0.7–4.0)
MCHC: 32.3 g/dL (ref 30.0–36.0)
MCV: 97.2 fl (ref 78.0–100.0)
Monocytes Absolute: 0.5 10*3/uL (ref 0.1–1.0)
Monocytes Relative: 7.3 % (ref 3.0–12.0)
Neutro Abs: 3.5 10*3/uL (ref 1.4–7.7)
Neutrophils Relative %: 56.8 % (ref 43.0–77.0)
Platelets: 228 10*3/uL (ref 150.0–400.0)
RBC: 5.3 Mil/uL (ref 4.22–5.81)
RDW: 13.4 % (ref 11.5–15.5)
WBC: 6.2 10*3/uL (ref 4.0–10.5)

## 2023-06-19 LAB — LIPID PANEL
Cholesterol: 148 mg/dL (ref 0–200)
HDL: 47.1 mg/dL (ref >39.00)
LDL Cholesterol: 74 mg/dL (ref 0–99)
NonHDL: 100.69
Total CHOL/HDL Ratio: 3
Triglycerides: 133 mg/dL (ref 0.0–149.0)
VLDL: 26.6 mg/dL (ref 0.0–40.0)

## 2023-06-19 LAB — HEMOGLOBIN A1C: Hgb A1c MFr Bld: 5.7 % (ref 4.6–6.5)

## 2023-06-19 LAB — MICROALBUMIN / CREATININE URINE RATIO
Creatinine,U: 141.5 mg/dL
Microalb Creat Ratio: 1 mg/g (ref 0.0–30.0)
Microalb, Ur: 1.3 mg/dL (ref 0.0–1.9)

## 2023-06-19 LAB — TSH: TSH: 1.18 u[IU]/mL (ref 0.35–5.50)

## 2023-06-19 NOTE — Progress Notes (Signed)
Subjective  Chief Complaint  Patient presents with   Annual Exam    Pt here for Annual exam and is not currently fasting. Eye exam is scheduled for next month   Diabetes    HPI: Gary Berry is a 52 y.o. male who presents to [facility] [place] today for a Male Wellness Visit. He also has the concerns and/or needs as listed above in the chief complaint. These will be addressed in addition to the Health Maintenance Visit.   Wellness Visit: annual visit with health maintenance review and exam   Health: Colorectal cancer screening is current and normal.  Eye exam is scheduled for next month.  No retinopathy.  Flu shot eligible today.  Other immunizations current.  Doing very well.  No concerns.  Feels happy and healthy.  Home life and work life are good.  Eating well.  Weight continues to slowly trend downward.  Physically active  Body mass index is 33.99 kg/m. Wt Readings from Last 3 Encounters:  06/19/23 230 lb 3.2 oz (104.4 kg)  11/28/22 232 lb 3.2 oz (105.3 kg)  11/10/22 235 lb (106.6 kg)    Chronic disease management visit and/or acute problem visit: Type 2 diabetes has been well-controlled on Farxiga 10 mg daily.  No symptoms of hypoglycemia.  No paresthesias.  Healthy diet.  No complications Hypertension is well-controlled.  He denies chest pain or shortness of breath.  No adverse effects from medications Heart failure: Most recent echocardiogram from 2023 showed improvement of EF to 50%.  He was to have a follow-up echocardiogram in January of this year, but I do not see the reported results.  He did have cardiac clearance visit last month, upcoming surgery to excise mucoid cyst of digit.  Fortunately, he is not feeling palpitations and his PVC burden has lessened by medications. Anxiety and depression are well-controlled on Wellbutrin XL 150 mg daily. Reviewed neurologic follow-up for sleep apnea.  Good compliance with CPAP. Hyperlipidemia: On statin.   Nonfasting for recheck today.  Tolerates well  Assessment  1. Annual physical exam   2. Type 2 diabetes mellitus without complication, without long-term current use of insulin (HCC)   3. Hypertension associated with diabetes (HCC)   4. Hyperlipidemia associated with type 2 diabetes mellitus (HCC)   5. Chronic systolic congestive heart failure, NYHA class 1 (HCC)   6. PVC (premature ventricular contraction)   7. Adjustment reaction with anxiety and depression   8. Encounter for immunization   9. OSA on CPAP   10. Diabetes mellitus treated with oral medication [facility])      Plan  Male Wellness Visit: Age appropriate Health Maintenance and Prevention measures were discussed with patient. Included topics are cancer screening recommendations, ways to keep healthy (see AVS) including dietary and exercise recommendations, regular eye and dental care, use of seat belts, and avoidance of moderate alcohol use and tobacco use.  Screens are current BMI: discussed patient's BMI and encouraged positive lifestyle modifications to help get to or maintain a target BMI. HM needs and immunizations were addressed and ordered. See below for orders. See HM and immunization section for updates.  Flu shot updated today Routine labs and screening tests ordered including cmp, cbc and lipids where appropriate. Discussed recommendations regarding Vit D and calcium supplementation (see AVS)  Chronic disease f/u and/or acute problem visit: (deemed necessary to be done in addition to the wellness visit): Type 2 diabetes has been well-controlled.  Continue Farxiga 10 mg daily.  Recheck A1c.  Check urine nephropathy screen.  He remains on Entresto.  Flu shot updated today eye exam scheduled Hyperlipidemia for nonfasting lipid profile today on Crestor 20.  Monitoring LFTs.  They have been normal Hypertension remains well-controlled High PVC burden well-controlled Chronic heart failure: Will follow-up on echocardiogram  results.  Continue Entresto Continue CPAP for sleep apnea Continue Wellbutrin XL 150 daily for mood.  Very well controlled  Follow up: 6 months to recheck blood pressure and diabetes Commons side effects, risks, benefits, and alternatives for medications and treatment plan prescribed today were discussed, and the patient expressed understanding of the given instructions. Patient is instructed to call or message via MyChart if he/she has any questions or concerns regarding our treatment plan. No barriers to understanding were identified. We discussed Red Flag symptoms and signs in detail. Patient expressed understanding regarding what to do in case of urgent or emergency type symptoms.  Medication list was reconciled, printed and provided to the patient in AVS. Patient instructions and summary information was reviewed with the patient as documented in the AVS. This note was prepared with assistance of Dragon voice recognition software. Occasional wrong-word or sound-a-like substitutions may have occurred due to the inherent limitations of voice recognition software  Orders Placed This Encounter  Procedures   Flu vaccine trivalent PF, 6mos and older(Flulaval,Afluria,Fluarix,Fluzone)   CBC with Differential/Platelet   Comprehensive metabolic panel   Lipid panel   Hemoglobin A1c   TSH   Microalbumin / creatinine urine ratio   No orders of the defined types were placed in this encounter.    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   Systolic congestive heart failure, NYHA class 1 (HCC) 04/24/2020   Type 2 diabetes mellitus without complication, without long-term current use of insulin (HCC) 11/21/2016   Hypertension associated with diabetes (HCC) 11/21/2016   Hyperlipidemia associated with type 2 diabetes mellitus (HCC) 11/21/2016    Adjustment reaction with anxiety and depression 05/28/2022   Obesity, Class II, BMI 35-39.9 11/21/2016   Umbilical hernia 06/23/2014   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   Health Maintenance  Topic Date Due   Diabetic kidney evaluation - eGFR measurement  05/29/2023   Diabetic kidney evaluation - Urine ACR  05/29/2023   HEMOGLOBIN A1C  05/29/2023   COVID-19 Vaccine (7 - 2023-24 season) 07/05/2023 (Originally 10/04/2022)   OPHTHALMOLOGY EXAM  07/04/2023   FOOT EXAM  06/18/2024   Colonoscopy  07/28/2029   DTaP/Tdap/Td (3 - Td or Tdap) 05/28/2032   INFLUENZA VACCINE  Completed   Hepatitis C Screening  Completed   Zoster Vaccines- Shingrix  Completed   HPV VACCINES  Aged Out   HIV Screening  Discontinued   Immunization History  Administered Date(s) Administered   Influenza Inj Mdck Quad Pf 08/01/2017   Influenza Inj Mdck Quad With Preservative 08/02/2021   Influenza, Seasonal, Injecte, Preservative Fre 08/01/2017, 06/19/2023   Influenza,inj,Quad PF,6+ Mos 12/06/2015, 11/21/2016, 08/22/2018, 07/19/2020, 08/09/2022   Influenza-Unspecified 08/22/2018, 08/01/2019   Moderna Covid-19 Vaccine Bivalent Booster 78yrs & up 08/09/2022   PFIZER Comirnaty(Gray Top)Covid-19 Tri-Sucrose Vaccine 02/08/2021   PFIZER(Purple Top)SARS-COV-2 Vaccination 12/30/2019, 01/20/2020, 08/18/2020   Pfizer Covid-19 Vaccine Bivalent Booster 3yrs & up 08/02/2021   Pneumococcal Polysaccharide-23 05/25/2017   Tdap 12/18/2011, 05/28/2022   Zoster Recombinant(Shingrix) 09/28/2020, 12/27/2020   We updated and reviewed the patient's  past history in detail and it is documented below. Allergies: Patient has No Known Allergies. Past Medical History  has a past medical history of Chronic right shoulder pain, requiring injection prn (03/20/2017), ED (erectile dysfunction) of organic origin (11/21/2016), Hyperlipidemia, Hyperlipidemia associated with type 2 diabetes mellitus (HCC) (11/21/2016),  Hypertension associated with diabetes (HCC) (11/21/2016), Obesity, Class II, BMI 35-39.9 (11/21/2016), Systolic congestive heart failure with reduced left ventricular function, NYHA class 2 (HCC) (04/24/2020), and Type 2 diabetes mellitus without complication, without long-term current use of insulin (HCC) (11/21/2016). Past Surgical History Patient  has a past surgical history that includes Tonsillectomy (1975); Hernia repair (2016); Vasectomy; RIGHT/LEFT HEART CATH AND CORONARY ANGIOGRAPHY (N/A, 05/04/2020); and PVC ABLATION (N/A, 02/14/2021). Social History Patient  reports that he has never smoked. He quit smokeless tobacco use about 25 years ago.  His smokeless tobacco use included chew. He reports current alcohol use of about 3.0 standard drinks of alcohol per week. He reports that he does not use drugs. Family History family history includes Arthritis in his mother; Atrial fibrillation in his father; Colon polyps in his father; Diabetes in his mother; Heart disease in his father; Hypertension in his mother; Sleep apnea in his father. Review of Systems: Constitutional: negative for fever or malaise Ophthalmic: negative for photophobia, double vision or loss of vision Cardiovascular: negative for chest pain, dyspnea on exertion, or new LE swelling Respiratory: negative for SOB or persistent cough Gastrointestinal: negative for abdominal pain, change in bowel habits or melena Genitourinary: negative for dysuria or gross hematuria Musculoskeletal: negative for new gait disturbance or muscular weakness Integumentary: negative for new or persistent rashes Neurological: negative for TIA or stroke symptoms Psychiatric: negative for SI or delusions Allergic/Immunologic: negative for hives  Patient Care Team    Relationship Specialty Notifications Start End  Willow Ora, MD PCP - General Family Medicine  03/31/22   Regan Lemming, MD PCP - Electrophysiology Cardiology  08/06/20   Chrystie Nose, MD PCP - Cardiology Cardiology  03/31/22   Betha Loa, MD Consulting Physician Orthopedic Surgery  06/19/23    Comment: hand surgeon  Butch Penny, NP Nurse Practitioner Neurology  06/19/23    Comment: manages CPAP   Objective  Vitals: BP 110/70   Pulse 75   Temp 97.8 F (36.6 C)   Ht 5\' 9"  (1.753 m)   Wt 230 lb 3.2 oz (104.4 kg)   SpO2 95%   BMI 33.99 kg/m  General:  Well developed, well nourished, no acute distress  Psych:  Alert and orientedx3,normal mood and affect HEENT:  Normocephalic, atraumatic, non-icteric sclera,  oropharynx is clear without mass or exudate, supple neck without adenopathy, or thyromegaly Cardiovascular:  Normal S1, S2, RRR without gallop, rub or murmur,  Respiratory:  Good breath sounds bilaterally, CTAB with normal respiratory effort Gastrointestinal: normal bowel sounds, soft, non-tender, no noted masses. No HSM MSK: Joints right second digit with distal OA changes and mucoid cyst present Skin:  Warm, no rashes Neurologic:    Mental status is normal.  Gross motor and sensory exams are normal. Stable gait. No tremor

## 2023-06-19 NOTE — Patient Instructions (Signed)
Please return in 3 months to recheck diabetes and blood pressure    I will release your lab results to you on your MyChart account with further instructions. You may see the results before I do, but when I review them I will send you a message with my report or have my assistant call you if things need to be discussed. Please reply to my message with any questions. Thank you!   If you have any questions or concerns, please don't hesitate to send me a message via MyChart or call the office at [phone]. Thank you for visiting with [place] today! It's our pleasure caring for you.

## 2023-06-24 ENCOUNTER — Encounter: Payer: Self-pay | Admitting: Family Medicine

## 2023-06-24 DIAGNOSIS — E1169 Type 2 diabetes mellitus with other specified complication: Secondary | ICD-10-CM

## 2023-06-24 MED ORDER — ROSUVASTATIN CALCIUM 40 MG PO TABS: 40.0000 mg | ORAL_TABLET | Freq: Every day | ORAL | 3 refills | Status: DC

## 2023-06-24 NOTE — Progress Notes (Signed)
See mychart note Dear Mr. Gary Berry, Everything looks stable; your LDL cholesterol is just above goal (we want it < 70 ideally). We can consider increasing the dose of your statin. Let me know if you'd like me to order that. No other changes are needed at this time.  Sincerely, Dr. Mardelle Matte

## 2023-07-03 ENCOUNTER — Other Ambulatory Visit: Payer: Self-pay

## 2023-07-03 ENCOUNTER — Encounter (HOSPITAL_BASED_OUTPATIENT_CLINIC_OR_DEPARTMENT_OTHER): Payer: Self-pay | Admitting: Orthopedic Surgery

## 2023-07-10 ENCOUNTER — Other Ambulatory Visit: Payer: Self-pay

## 2023-07-10 ENCOUNTER — Encounter (HOSPITAL_BASED_OUTPATIENT_CLINIC_OR_DEPARTMENT_OTHER)
Admission: RE | Disposition: A | Discharge status: Home / Self Care | Payer: Self-pay | Source: Home / Self Care | Attending: Orthopedic Surgery

## 2023-07-10 ENCOUNTER — Encounter (HOSPITAL_BASED_OUTPATIENT_CLINIC_OR_DEPARTMENT_OTHER): Payer: Self-pay | Admitting: Orthopedic Surgery

## 2023-07-10 ENCOUNTER — Ambulatory Visit (HOSPITAL_BASED_OUTPATIENT_CLINIC_OR_DEPARTMENT_OTHER): Payer: 59 | Admitting: Anesthesiology

## 2023-07-10 ENCOUNTER — Ambulatory Visit (HOSPITAL_BASED_OUTPATIENT_CLINIC_OR_DEPARTMENT_OTHER)
Admission: RE | Admit: 2023-07-10 | Discharge: 2023-07-10 | Disposition: A | Discharge status: Home / Self Care | Payer: 59 | Attending: Orthopedic Surgery | Admitting: Orthopedic Surgery

## 2023-07-10 DIAGNOSIS — I5022 Chronic systolic (congestive) heart failure: Secondary | ICD-10-CM | POA: Insufficient documentation

## 2023-07-10 DIAGNOSIS — M19041 Primary osteoarthritis, right hand: Secondary | ICD-10-CM | POA: Diagnosis not present

## 2023-07-10 DIAGNOSIS — M71341 Other bursal cyst, right hand: Secondary | ICD-10-CM | POA: Insufficient documentation

## 2023-07-10 DIAGNOSIS — M6748 Ganglion, other site: Secondary | ICD-10-CM | POA: Diagnosis not present

## 2023-07-10 DIAGNOSIS — Z87891 Personal history of nicotine dependence: Secondary | ICD-10-CM | POA: Insufficient documentation

## 2023-07-10 DIAGNOSIS — E785 Hyperlipidemia, unspecified: Secondary | ICD-10-CM | POA: Insufficient documentation

## 2023-07-10 DIAGNOSIS — G4730 Sleep apnea, unspecified: Secondary | ICD-10-CM | POA: Insufficient documentation

## 2023-07-10 DIAGNOSIS — E1159 Type 2 diabetes mellitus with other circulatory complications: Secondary | ICD-10-CM | POA: Insufficient documentation

## 2023-07-10 DIAGNOSIS — M25741 Osteophyte, right hand: Secondary | ICD-10-CM | POA: Insufficient documentation

## 2023-07-10 DIAGNOSIS — Z7984 Long term (current) use of oral hypoglycemic drugs: Secondary | ICD-10-CM | POA: Diagnosis not present

## 2023-07-10 DIAGNOSIS — E119 Type 2 diabetes mellitus without complications: Secondary | ICD-10-CM | POA: Diagnosis not present

## 2023-07-10 DIAGNOSIS — E669 Obesity, unspecified: Secondary | ICD-10-CM | POA: Diagnosis not present

## 2023-07-10 DIAGNOSIS — I110 Hypertensive heart disease with heart failure: Secondary | ICD-10-CM | POA: Diagnosis not present

## 2023-07-10 DIAGNOSIS — Z6839 Body mass index (BMI) 39.0-39.9, adult: Secondary | ICD-10-CM | POA: Insufficient documentation

## 2023-07-10 DIAGNOSIS — Z01818 Encounter for other preprocedural examination: Secondary | ICD-10-CM

## 2023-07-10 HISTORY — PX: CYST EXCISION: SHX5701

## 2023-07-10 HISTORY — DX: Anxiety disorder, unspecified: F41.9

## 2023-07-10 HISTORY — DX: Sleep apnea, unspecified: G47.30

## 2023-07-10 HISTORY — DX: Depression, unspecified: F32.A

## 2023-07-10 HISTORY — DX: Cardiac arrhythmia, unspecified: I49.9

## 2023-07-10 LAB — GLUCOSE, CAPILLARY
Glucose-Capillary: 99 mg/dL (ref 70–99)
Glucose-Capillary: 92 mg/dL (ref 70–99)

## 2023-07-10 SURGERY — CYST REMOVAL
Anesthesia: Regional | Site: Hand | Laterality: Right

## 2023-07-10 MED ORDER — LIDOCAINE 2% (20 MG/ML) 5 ML SYRINGE
INTRAMUSCULAR | Status: AC
  Filled 2023-07-10: qty 5

## 2023-07-10 MED ORDER — PROMETHAZINE HCL 25 MG/ML IJ SOLN: 6.2500 mg | INTRAMUSCULAR | Status: DC | PRN

## 2023-07-10 MED ORDER — OXYCODONE HCL 5 MG PO TABS: 5.0000 mg | ORAL_TABLET | Freq: Once | ORAL | Status: DC | PRN

## 2023-07-10 MED ORDER — DROPERIDOL 2.5 MG/ML IJ SOLN: 0.6250 mg | Freq: Once | INTRAMUSCULAR | Status: DC | PRN

## 2023-07-10 MED ORDER — PROPOFOL 500 MG/50ML IV EMUL
INTRAVENOUS | Status: DC | PRN
Start: 2023-07-10 — End: 2023-07-10
  Administered 2023-07-10: 100 ug/kg/min via INTRAVENOUS

## 2023-07-10 MED ORDER — LIDOCAINE HCL (CARDIAC) PF 100 MG/5ML IV SOSY
PREFILLED_SYRINGE | INTRAVENOUS | Status: DC | PRN
  Administered 2023-07-10: 50 mg via INTRAVENOUS

## 2023-07-10 MED ORDER — BUPIVACAINE HCL (PF) 0.5 % IJ SOLN
INTRAMUSCULAR | Status: DC | PRN
Start: 2023-07-10 — End: 2023-07-10
  Administered 2023-07-10: 8 mL via PERINEURAL

## 2023-07-10 MED ORDER — MIDAZOLAM HCL 2 MG/2ML IJ SOLN
INTRAMUSCULAR | Status: AC
  Filled 2023-07-10: qty 2

## 2023-07-10 MED ORDER — ONDANSETRON HCL 4 MG/2ML IJ SOLN
INTRAMUSCULAR | Status: AC
  Filled 2023-07-10: qty 2

## 2023-07-10 MED ORDER — ACETAMINOPHEN 325 MG PO TABS: 325.0000 mg | ORAL_TABLET | ORAL | Status: DC | PRN

## 2023-07-10 MED ORDER — CEFAZOLIN SODIUM-DEXTROSE 2-4 GM/100ML-% IV SOLN
2.0000 g | INTRAVENOUS | Status: AC
  Administered 2023-07-10: 2 g via INTRAVENOUS

## 2023-07-10 MED ORDER — DEXMEDETOMIDINE HCL IN NACL 80 MCG/20ML IV SOLN
INTRAVENOUS | Status: DC | PRN
Start: 2023-07-10 — End: 2023-07-10
  Administered 2023-07-10: 8 ug via INTRAVENOUS

## 2023-07-10 MED ORDER — LIDOCAINE-EPINEPHRINE (PF) 1.5 %-1:200000 IJ SOLN
INTRAMUSCULAR | Status: DC | PRN
Start: 2023-07-10 — End: 2023-07-10
  Administered 2023-07-10: 14 mL via PERINEURAL

## 2023-07-10 MED ORDER — TRAMADOL HCL 50 MG PO TABS
ORAL_TABLET | ORAL | 0 refills | Status: DC
Start: 2023-07-10 — End: 2023-10-08

## 2023-07-10 MED ORDER — ACETAMINOPHEN 160 MG/5ML PO SOLN: 325.0000 mg | ORAL | Status: DC | PRN

## 2023-07-10 MED ORDER — FENTANYL CITRATE (PF) 100 MCG/2ML IJ SOLN
INTRAMUSCULAR | Status: DC | PRN
  Administered 2023-07-10: 50 ug via INTRAVENOUS

## 2023-07-10 MED ORDER — LACTATED RINGERS IV SOLN: INTRAVENOUS | Status: DC

## 2023-07-10 MED ORDER — FENTANYL CITRATE (PF) 100 MCG/2ML IJ SOLN
INTRAMUSCULAR | Status: AC
  Filled 2023-07-10: qty 2

## 2023-07-10 MED ORDER — CEFAZOLIN SODIUM-DEXTROSE 2-4 GM/100ML-% IV SOLN
INTRAVENOUS | Status: AC
  Filled 2023-07-10: qty 100

## 2023-07-10 MED ORDER — PROPOFOL 500 MG/50ML IV EMUL
INTRAVENOUS | Status: AC
  Filled 2023-07-10: qty 50

## 2023-07-10 MED ORDER — MIDAZOLAM HCL 2 MG/2ML IJ SOLN
2.0000 mg | Freq: Once | INTRAMUSCULAR | Status: AC
  Administered 2023-07-10: 2 mg via INTRAVENOUS

## 2023-07-10 MED ORDER — FENTANYL CITRATE (PF) 100 MCG/2ML IJ SOLN: 25.0000 ug | INTRAMUSCULAR | Status: DC | PRN

## 2023-07-10 MED ORDER — OXYCODONE HCL 5 MG/5ML PO SOLN: 5.0000 mg | Freq: Once | ORAL | Status: DC | PRN

## 2023-07-10 MED ORDER — ACETAMINOPHEN 10 MG/ML IV SOLN: 1000.0000 mg | Freq: Once | INTRAVENOUS | Status: DC | PRN

## 2023-07-10 SURGICAL SUPPLY — 55 items
APL PRP STRL LF DISP 70% ISPRP (MISCELLANEOUS) ×2
APL SKNCLS STERI-STRIP NONHPOA (GAUZE/BANDAGES/DRESSINGS)
BANDAGE GAUZE 1X75IN STRL (MISCELLANEOUS) IMPLANT
BENZOIN TINCTURE PRP APPL 2/3 (GAUZE/BANDAGES/DRESSINGS) IMPLANT
BLADE MINI RND TIP GREEN BEAV (BLADE) IMPLANT
BLADE SURG 15 STRL LF DISP TIS (BLADE) ×4 IMPLANT
BLADE SURG 15 STRL SS (BLADE) ×4
BNDG CMPR 5X2 CHSV 1 LYR STRL (GAUZE/BANDAGES/DRESSINGS)
BNDG CMPR 5X2 KNTD ELC UNQ LF (GAUZE/BANDAGES/DRESSINGS)
BNDG CMPR 5X3 KNIT ELC UNQ LF (GAUZE/BANDAGES/DRESSINGS)
BNDG CMPR 75X11 PLY HI ABS (MISCELLANEOUS)
BNDG CMPR 75X21 PLY HI ABS (MISCELLANEOUS)
BNDG CMPR 9X4 STRL LF SNTH (GAUZE/BANDAGES/DRESSINGS)
BNDG COHESIVE 1X5 TAN STRL LF (GAUZE/BANDAGES/DRESSINGS) ×1 IMPLANT
BNDG COHESIVE 2X5 TAN ST LF (GAUZE/BANDAGES/DRESSINGS) IMPLANT
BNDG ELASTIC 2INX 5YD STR LF (GAUZE/BANDAGES/DRESSINGS) IMPLANT
BNDG ELASTIC 3INX 5YD STR LF (GAUZE/BANDAGES/DRESSINGS) IMPLANT
BNDG ESMARK 4X9 LF (GAUZE/BANDAGES/DRESSINGS) IMPLANT
BNDG GAUZE 1X75IN STRL (MISCELLANEOUS)
BNDG GAUZE DERMACEA FLUFF 4 (GAUZE/BANDAGES/DRESSINGS) IMPLANT
BNDG GZE DERMACEA 4 6PLY (GAUZE/BANDAGES/DRESSINGS)
BNDG PLASTER X FAST 3X3 WHT LF (CAST SUPPLIES) IMPLANT
BNDG PLSTR 9X3 FST ST WHT (CAST SUPPLIES)
CHLORAPREP W/TINT 26 (MISCELLANEOUS) ×2 IMPLANT
CORD BIPOLAR FORCEPS 12FT (ELECTRODE) ×2 IMPLANT
COVER BACK TABLE 60X90IN (DRAPES) ×2 IMPLANT
COVER MAYO STAND STRL (DRAPES) ×2 IMPLANT
CUFF TOURN SGL QUICK 18X4 (TOURNIQUET CUFF) ×2 IMPLANT
DRAPE EXTREMITY T 121X128X90 (DISPOSABLE) ×2 IMPLANT
DRAPE SURG 17X23 STRL (DRAPES) ×2 IMPLANT
GAUZE SPONGE 4X4 12PLY STRL (GAUZE/BANDAGES/DRESSINGS) ×2 IMPLANT
GAUZE STRETCH 2X75IN STRL (MISCELLANEOUS) IMPLANT
GAUZE XEROFORM 1X8 LF (GAUZE/BANDAGES/DRESSINGS) ×2 IMPLANT
GLOVE BIO SURGEON STRL SZ7.5 (GLOVE) ×2 IMPLANT
GLOVE BIOGEL PI IND STRL 8 (GLOVE) ×2 IMPLANT
GOWN STRL REUS W/ TWL LRG LVL3 (GOWN DISPOSABLE) ×2 IMPLANT
GOWN STRL REUS W/TWL LRG LVL3 (GOWN DISPOSABLE) ×2
NDL HYPO 25X1 1.5 SAFETY (NEEDLE) ×1 IMPLANT
NEEDLE HYPO 25X1 1.5 SAFETY (NEEDLE) ×2
NS IRRIG 1000ML POUR BTL (IV SOLUTION) ×2 IMPLANT
PACK BASIN DAY SURGERY FS (CUSTOM PROCEDURE TRAY) ×2 IMPLANT
PAD CAST 3X4 CTTN HI CHSV (CAST SUPPLIES) IMPLANT
PAD CAST 4YDX4 CTTN HI CHSV (CAST SUPPLIES) IMPLANT
PADDING CAST ABS COTTON 4X4 ST (CAST SUPPLIES) ×2 IMPLANT
PADDING CAST COTTON 3X4 STRL (CAST SUPPLIES)
PADDING CAST COTTON 4X4 STRL (CAST SUPPLIES)
SPLINT FINGER 3.25 911903 (SOFTGOODS) ×1 IMPLANT
STOCKINETTE 4X48 STRL (DRAPES) ×2 IMPLANT
STRIP CLOSURE SKIN 1/2X4 (GAUZE/BANDAGES/DRESSINGS) IMPLANT
SUT ETHILON 3 0 PS 1 (SUTURE) IMPLANT
SUT ETHILON 4 0 PS 2 18 (SUTURE) ×2 IMPLANT
SYR BULB EAR ULCER 3OZ GRN STR (SYRINGE) ×2 IMPLANT
SYR CONTROL 10ML LL (SYRINGE) ×2 IMPLANT
TOWEL GREEN STERILE FF (TOWEL DISPOSABLE) ×4 IMPLANT
UNDERPAD 30X36 HEAVY ABSORB (UNDERPADS AND DIAPERS) ×2 IMPLANT

## 2023-07-10 NOTE — Anesthesia Procedure Notes (Signed)
Anesthesia Regional Block: Supraclavicular block   Pre-Anesthetic Checklist: , timeout performed,  Correct Patient, Correct Site, Correct Laterality,  Correct Procedure, Correct Position, site marked,  Risks and benefits discussed,  Surgical consent,  Pre-op evaluation,  At surgeon's request and post-op pain management  Laterality: Upper and Right  Prep: chloraprep       Needles:  Injection technique: Single-shot  Needle Type: Echogenic Needle     Needle Length: 5cm  Needle Gauge: 21     Additional Needles:   Procedures:,,,, ultrasound used (permanent image in chart),,     Nerve Stimulator or Paresthesia:   Additional Responses:  Block tested.  Patient tolerated procedure well Narrative:  Start time: 07/10/2023 12:35 PM End time: 07/10/2023 12:46 PM Injection made incrementally with aspirations every 5 mL.  Performed by: Personally  Anesthesiologist: Trevor Iha, MD  Additional Notes: Block tested. Patient tolerated procedure well.

## 2023-07-10 NOTE — Op Note (Signed)
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  Right long finger excision mucoid cyst 2.  Right long finger debridement of DIP joint including osteophyte from middle phalanx   SURGEON:  Betha Loa, M.D.   ASSISTANT: none   ANESTHESIA:  Regional with sedation   INTRAVENOUS FLUIDS:  Per anesthesia flow sheet.   ESTIMATED BLOOD LOSS:  Minimal.   COMPLICATIONS:  None.   SPECIMENS: Mucoid cyst to pathology   TOURNIQUET TIME:    Total Tourniquet Time Documented: Upper Arm (Right) - 18 minutes Total: Upper Arm (Right) - 18 minutes    DISPOSITION:  Stable to PACU.   INDICATIONS: 52 year old male with mucoid cyst of right long finger.  It is bothersome to him.  He wishes to have it removed and the DIP joint debrided to try to prevent recurrence.  Risks, benefits and alternatives of surgery were discussed including the risks of blood loss, infection, damage to nerves, vessels, tendons, ligaments, bone for surgery, need for additional surgery, complications with wound healing, continued pain, stiffness, , recurrence.  He voiced understanding of these risks and elected to proceed.  OPERATIVE COURSE:  After being identified preoperatively by myself,  the patient and I agreed on the procedure and site of the procedure.  The surgical site was marked.  Surgical consent had been signed. Preoperative IV antibiotic prophylaxis was given. He was transferred to the operating room and placed on the operating table in supine position with the right upper extremity on an arm board.  Sedation was induced by the anesthesiologist. A regional block had been  performed by anesthesia in preoperative holding.    Right upper extremity was prepped and draped in normal sterile orthopedic fashion.  A surgical pause was performed between the surgeons, anesthesia, and operating room staff and all were in agreement as to the patient, procedure, and site of procedure.  Tourniquet at the proximal aspect of the extremity was inflated to 250 mmHg after exsanguination of the arm with an Esmarch bandage.  A hockey-stick shaped incision was made over the DIP joint of the long finger.  This was carried in subcutaneous tissues by spreading technique.  Bipolar electrocautery is used to obtain hemostasis.  The cyst was carefully freed up.  It was filled with clear gelatinous fluid.  It was freed from surrounding soft tissue attachments and removed and sent to pathology for examination.  The DIP joint was entered underneath the extensor tendon.  The synovectomy rongeurs were used to debride the joint including prominent osteophyte from the dorsum of the middle phalanx.  The wound and joint were then copiously irrigated with sterile saline.  The wound was closed with 4-0 nylon in a horizontal mattress fashion.  It was dressed with sterile Xeroform 4 x 4 and wrapped with a Coban dressing lightly.  An AlumaFoam splint was placed and wrapped lightly with Coban dressing.  The tourniquet was deflated at 18 minutes.  Fingertips were pink with brisk capillary refill after deflation of tourniquet.  The operative  drapes were broken down.  The patient was awoken from anesthesia safely.  He was transferred back to the stretcher and taken to PACU in stable condition.  I will see him back in the office in 1 week for postoperative followup.  I will give him a prescription for Tramadol 50 mg 1-2 tabs PO q6 hours prn pain, dispense # 20.   Betha Loa, MD Electronically signed, 07/10/23

## 2023-07-10 NOTE — Anesthesia Preprocedure Evaluation (Addendum)
Anesthesia Evaluation  Patient identified by MRN, date of birth, ID band Patient awake    Reviewed: Allergy & Precautions, NPO status , Patient's Chart, lab work & pertinent test results  Airway Mallampati: I  TM Distance: >3 FB Neck ROM: Full    Dental no notable dental hx. (+) Dental Advisory Given, Teeth Intact   Pulmonary sleep apnea    Pulmonary exam normal breath sounds clear to auscultation       Cardiovascular hypertension, Normal cardiovascular exam+ dysrhythmias  Rhythm:Regular Rate:Normal     Neuro/Psych  PSYCHIATRIC DISORDERS Anxiety        GI/Hepatic negative GI ROS, Neg liver ROS,,,  Endo/Other  diabetes, Type 2    Renal/GU negative Renal ROS     Musculoskeletal   Abdominal   Peds  Hematology   Anesthesia Other Findings   Reproductive/Obstetrics                             Anesthesia Physical Anesthesia Plan  ASA: 2  Anesthesia Plan: Regional   Post-op Pain Management: Regional block*, Minimal or no pain anticipated and Ofirmev IV (intra-op)*   Induction:   PONV Risk Score and Plan: Treatment may vary due to age or medical condition, Midazolam, Metaclopromide and Propofol infusion  Airway Management Planned: Nasal Cannula and Natural Airway  Additional Equipment: None  Intra-op Plan:   Post-operative Plan:   Informed Consent: I have reviewed the patients History and Physical, chart, labs and discussed the procedure including the risks, benefits and alternatives for the proposed anesthesia with the patient or authorized representative who has indicated his/her understanding and acceptance.     Dental advisory given  Plan Discussed with: CRNA and Anesthesiologist  Anesthesia Plan Comments: (R Supraclavicular c Mac)        Anesthesia Quick Evaluation

## 2023-07-10 NOTE — H&P (Signed)
Gary Berry is an 52 y.o. male.   Chief Complaint: mucoid cyst HPI: 52 yo male with mucoid cyst right long finger.  It is bothersome to him.  He wishes to have it removed and the dip joint debrided to try to prevent recurrence.  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   Hyperlipidemia    Hyperlipidemia associated with type 2 diabetes mellitus (HCC) 11/21/2016   Hypertension associated with diabetes (HCC) 11/21/2016   Obesity, Class II, BMI 35-39.9 11/21/2016   Sleep apnea    uses CPAP nightly   Systolic congestive heart failure with reduced left ventricular function, NYHA class 2 (HCC) 04/24/2020   Echocardiogram 04/2020:  Ef 35-40%, global hypokinesis   Type 2 diabetes mellitus without complication, without long-term current use of insulin (HCC) 11/21/2016    Past Surgical History:  Procedure Laterality Date   HERNIA REPAIR  2016   hard to wake up after sx   PVC ABLATION N/A 02/14/2021   Procedure: PVC ABLATION;  Surgeon: Regan Lemming, MD;  Location: [facility] INVASIVE CV LAB;  Service: Cardiovascular;  Laterality: N/A;   RIGHT/LEFT HEART CATH AND CORONARY ANGIOGRAPHY N/A 05/04/2020   Procedure: RIGHT/LEFT HEART CATH AND CORONARY ANGIOGRAPHY;  Surgeon: Marykay Lex, MD;  [facility] CV [facility];  Service: Cardiovascular;  Laterality: N/A;   TONSILLECTOMY  1975   VASECTOMY      Family History: Family History  Problem Relation Age of Onset   Diabetes Mother    Hypertension Mother    Arthritis Mother    Atrial fibrillation Father    Colon polyps Father    Sleep apnea Father    Heart disease Father    Colon cancer Neg Hx    Esophageal cancer Neg Hx    Rectal cancer Neg Hx    Stomach cancer Neg Hx     Social History:   reports that he has never smoked. He  quit smokeless tobacco use about 25 years ago.  His smokeless tobacco use included chew. He reports current alcohol use of about 3.0 standard drinks of alcohol per week. He reports that he does not use drugs.  Medications: Medications Prior to Admission  Medication Sig Dispense Refill   buPROPion (WELLBUTRIN XL) 150 MG 24 hr tablet TAKE 1 TABLET BY MOUTH EVERY DAY 90 tablet 1   cetirizine (ZYRTEC) 10 MG tablet Take 10 mg by mouth daily as needed for allergies.     Cholecalciferol (VITAMIN D) 125 MCG (5000 UT) CAPS Take 5,000 Units by mouth daily.      ENTRESTO 49-51 MG TAKE 1 TABLET BY MOUTH TWICE A DAY 180 tablet 3   FARXIGA 10 MG TABS tablet TAKE 1 TABLET BY MOUTH DAILY BEFORE BREAKFAST. 90 tablet 3   ibuprofen (ADVIL) 200 MG tablet Take 400 mg by mouth every 6 (six) hours as needed for headache or moderate pain.     mexiletine (MEXITIL) 250 MG capsule Take 1 capsule (250 mg total) by mouth 2 (two) times daily. 180 capsule 3   naproxen sodium (ALEVE) 220 MG tablet Take 220 mg by mouth daily as needed (pain).     Omega-3 Fatty Acids (FISH OIL) 1000 MG CAPS Take 2,000 mg by mouth daily.     rosuvastatin (CRESTOR) 40 MG  tablet Take 1 tablet (40 mg total) by mouth daily. 90 tablet 3   sotalol (BETAPACE) 160 MG tablet TAKE 1 TABLET BY MOUTH 2 TIMES DAILY. 180 tablet 1   tadalafil (CIALIS) 5 MG tablet TAKE 1 TABLET BY MOUTH DAILY AS NEEDED FOR ERECTILE DYSFUNCTION. 90 tablet 1    Results for orders placed or performed during the hospital encounter of 07/10/23 (from the past 48 hour(s))  Glucose, capillary     Status: None   Collection Time: 07/10/23 11:20 AM  Result Value Ref Range   Glucose-Capillary 99 70 - 99 mg/dL    Comment: Glucose reference range applies only to samples taken after fasting for at least 8 hours.    No results found.    Blood pressure 108/69, pulse (!) 50, temperature (!) 97.3 F (36.3 C), temperature source Oral, resp. rate 16, height 5\' 9"  (1.753 m), weight 103.4  kg, SpO2 95%.  General appearance: alert, cooperative, and appears stated age Head: Normocephalic, without obvious abnormality, atraumatic Neck: supple, symmetrical, trachea midline Extremities: Intact sensation and capillary refill all digits.  +epl/fpl/io.  No wounds.  Pulses: 2+ and symmetric Skin: Skin color, texture, turgor normal. No rashes or lesions Neurologic: Grossly normal Incision/Wound: none  Assessment/Plan Right long finger mucoid cyst and dip joint arthritis.  Non operative and operative treatment options have been discussed with the patient and patient wishes to proceed with operative treatment. Risks, benefits, and alternatives of surgery have been discussed and the patient agrees with the plan of care.   Betha Loa 07/10/2023, 1:08 PM

## 2023-07-10 NOTE — Discharge Instructions (Addendum)
Post Anesthesia Home Care Instructions  Activity: Get plenty of rest for the remainder of the day. A responsible individual must stay with you for 24 hours following the procedure.  For the next 24 hours, DO NOT: -Drive a car -Advertising copywriter -Drink alcoholic beverages -Take any medication unless instructed by your physician -Make any legal decisions or sign important papers.  Meals: Start with liquid foods such as gelatin or soup. Progress to regular foods as tolerated. Avoid greasy, spicy, heavy foods. If nausea and/or vomiting occur, drink only clear liquids until the nausea and/or vomiting subsides. Call your physician if vomiting continues.  Special Instructions/Symptoms: Your throat may feel dry or sore from the anesthesia or the breathing tube placed in your throat during surgery. If this causes discomfort, gargle with warm salt water. The discomfort should disappear within 24 hours.  If you had a scopolamine patch placed behind your ear for the management of post- operative nausea and/or vomiting:  1. The medication in the patch is effective for 72 hours, after which it should be removed.  Wrap patch in a tissue and discard in the trash. Wash hands thoroughly with soap and water. 2. You may remove the patch earlier than 72 hours if you experience unpleasant side effects which may include dry mouth, dizziness or visual disturbances. 3. Avoid touching the patch. Wash your hands with soap and water after contact with the patch.    [facility] Instructions Hand Surgery  Wound Care: Keep your hand elevated above the level of your heart.  Do not allow it to dangle by your side.  Keep the dressing dry and do not remove it unless your doctor advises you to do so.  He will usually change it at the time of your post-op visit.  Moving your fingers is advised to stimulate circulation but will depend on the site of your surgery.  If you have a splint applied, your doctor will advise you  regarding movement.  Activity: Do not drive or operate machinery today.  Rest today and then you may return to your normal activity and work as indicated by your physician.  Diet:  Drink liquids today or eat a light diet.  You may resume a regular diet tomorrow.    General expectations: Pain for two to three days. Fingers may become slightly swollen.  Call your doctor if any of the following occur: Severe pain not relieved by pain medication. Elevated temperature. Dressing soaked with blood. Inability to move fingers. White or bluish color to fingers.

## 2023-07-10 NOTE — Progress Notes (Signed)
Assisted Dr. Richardson Landry with right, supraclavicular, ultrasound guided block. Side rails up, monitors on throughout procedure. See vital signs in flow sheet. Tolerated Procedure well.

## 2023-07-10 NOTE — Anesthesia Postprocedure Evaluation (Signed)
Anesthesia Post Note  Patient: Gary Berry  Procedure(s) Performed: RIGHT LONG FINGER EXCISION MUCOID CYST AND DEBRIDEMENT DISTAL INTERPHALANGEAL JOINT (Right: Hand)     Patient location during evaluation: PACU Anesthesia Type: Regional Level of consciousness: awake and alert Pain management: pain level controlled Vital Signs Assessment: post-procedure vital signs reviewed and stable Respiratory status: spontaneous breathing, nonlabored ventilation, respiratory function stable and patient connected to nasal cannula oxygen Cardiovascular status: stable and blood pressure returned to baseline Postop Assessment: no apparent nausea or vomiting Anesthetic complications: no  No notable events documented.  Last Vitals:  Vitals:   07/10/23 1445 07/10/23 1520  BP: 110/62 114/63  Pulse: (!) 49 (!) 50  Resp: 15 16  Temp:  (!) 36.3 C  SpO2: 96% 98%    Last Pain:  Vitals:   07/10/23 1520  TempSrc:   PainSc: 0-No pain                 Shelton Silvas

## 2023-07-10 NOTE — Transfer of Care (Signed)
Immediate Anesthesia Transfer of Care Note  Patient: Gary Berry  Procedure(s) Performed: RIGHT LONG FINGER EXCISION MUCOID CYST AND DEBRIDEMENT DISTAL INTERPHALANGEAL JOINT (Right: Hand)  Patient Location: PACU  Anesthesia Type:MAC  Level of Consciousness: awake, alert , oriented, and patient cooperative  Airway & Oxygen Therapy: Patient Spontanous Breathing and Patient connected to face mask oxygen  Post-op Assessment: Report given to RN and Post -op Vital signs reviewed and stable  Post vital signs: Reviewed and stable  Last Vitals:  Vitals Value Taken Time  BP 107/65 07/10/23 1430  Temp    Pulse 51 07/10/23 1432  Resp 14 07/10/23 1432  SpO2 96 % 07/10/23 1432  Vitals shown include unfiled device data.  Last Pain:  Vitals:   07/10/23 1426  TempSrc:   PainSc: 0-No pain      Patients Stated Pain Goal: 3 (07/10/23 1117)  Complications: No notable events documented.

## 2023-07-13 ENCOUNTER — Encounter (HOSPITAL_BASED_OUTPATIENT_CLINIC_OR_DEPARTMENT_OTHER): Payer: Self-pay | Admitting: Orthopedic Surgery

## 2023-07-13 LAB — SURGICAL PATHOLOGY

## 2023-07-21 LAB — HM DIABETES EYE EXAM

## 2023-08-06 ENCOUNTER — Other Ambulatory Visit: Payer: Self-pay | Admitting: Cardiology

## 2023-09-07 ENCOUNTER — Other Ambulatory Visit: Payer: Self-pay | Admitting: Internal Medicine

## 2023-09-19 ENCOUNTER — Other Ambulatory Visit: Payer: Self-pay | Admitting: Cardiology

## 2023-09-23 ENCOUNTER — Encounter: Payer: Self-pay | Admitting: Cardiology

## 2023-09-23 ENCOUNTER — Encounter: Payer: Self-pay | Admitting: Family Medicine

## 2023-09-25 ENCOUNTER — Other Ambulatory Visit: Payer: Self-pay | Admitting: Cardiology

## 2023-09-25 MED ORDER — SOTALOL HCL 160 MG PO TABS: 160.0000 mg | ORAL_TABLET | Freq: Two times a day (BID) | ORAL | 0 refills | Status: DC

## 2023-09-25 NOTE — Addendum Note (Signed)
Addended by: Baird Lyons on: 09/25/2023 01:30 PM   Modules accepted: Orders

## 2023-09-25 NOTE — Telephone Encounter (Signed)
Pt aware I am sending in 90 day supply, approved by Dr. Elberta Fortis. Aware he needs to be seen for further refills as it has been 2 years.  Aware scheduler will call to arrange follow up w/ MD/APP. Pt agreeable to plan

## 2023-10-05 NOTE — Progress Notes (Unsigned)
  Cardiology Office Note:  .   Date:  10/05/2023  ID:  Gary Berry, DOB May 20, 1971, MRN [id number] PCP: Willow Ora, MD  [facility] Providers Cardiologist:  Chrystie Nose, MD Electrophysiologist:  Regan Lemming, MD {  History of Present Illness: .   Gary Berry is a 52 y.o. male w/PMHx of HTN, HLD, DM, PVCs, NICM, chronic CHF (systolic), OSA (mild) w/CPAP, RBBB  Saw Dr. Elberta Fortis May 2022, fatigued, not felt much improvement post ablation/sotalol, planned for monitor  PVC burden down to 5.9%  Following with cardiology team Last with Dr. Rennis Golden 11/10/22, LVEF by his last echo improved to 45-50%, unaware of PVCs Planned for annual echo, no changes to his meds made  Today's visit is scheduled as as an overdue visit  ROS: ***  *** Sotalol EKG, labs *** burden *** volume  Arrhythmia/AAD hx PVCs Mexiletine started Oct 2021 PVC ablation 02/14/21 Sotalol started April 2022 post ablation   Studies Reviewed: Marland Kitchen    EKG done today and reviewed by myself:  ***  03/14/22: TTE  1. Left ventricular ejection fraction, by estimation, is 45 to 50%. The  left ventricle has mildly decreased function. The left ventricle has no  regional wall motion abnormalities. The left ventricular internal cavity  size was mildly dilated. Left  ventricular diastolic parameters were normal.   2. Right ventricular systolic function is mildly reduced. The right  ventricular size is mildly enlarged. There is normal pulmonary artery  systolic pressure. The estimated right ventricular systolic pressure is  19.0 mmHg.   3. Left atrial size was mildly dilated.   4. The mitral valve is grossly normal. Trivial mitral valve  regurgitation. No evidence of mitral stenosis.   5. The aortic valve is tricuspid. Aortic valve regurgitation is not  visualized. No aortic stenosis is present.   6. The inferior vena cava is normal in size with greater than 50%  respiratory variability, suggesting right  atrial pressure of 3 mmHg.   Comparison(s): Changes from prior study are noted. EF has improved ~50%.   02/14/21: EPS/ablation CONCLUSIONS:  1. Sinus rhythm upon presentation.  2. The patient had PVCs 3. Successful radiofrequency modification of the PVCs in the right coronary cusp 4. No early apparent complications.    Risk Assessment/Calculations:    Physical Exam:   VS:  There were no vitals taken for this visit.   Wt Readings from Last 3 Encounters:  07/10/23 227 lb 15.3 oz (103.4 kg)  06/19/23 230 lb 3.2 oz (104.4 kg)  11/28/22 232 lb 3.2 oz (105.3 kg)    GEN: Well nourished, well developed in no acute distress NECK: No JVD; No carotid bruits CARDIAC: ***RRR, no murmurs, rubs, gallops RESPIRATORY:  *** CTA b/l without rales, wheezing or rhonchi  ABDOMEN: Soft, non-tender, non-distended EXTREMITIES:  No edema; No deformity   ASSESSMENT AND PLAN: .    PVCs *** On mexiletine/sotalol *** QTc *** labs  NICM Chronic CHF Last echo with improved LVEF 45-50% *** C/w Dr. Coy Saunas  HTN ***     {Are you ordering a CV Procedure (e.g. stress test, cath, DCCV, TEE, etc)?   Press F2        :[id number]}     Dispo: ***  Signed, Sheilah Pigeon, PA-C

## 2023-10-07 ENCOUNTER — Other Ambulatory Visit: Payer: Self-pay | Admitting: Internal Medicine

## 2023-10-08 ENCOUNTER — Ambulatory Visit: Payer: 59 | Attending: Physician Assistant | Admitting: Physician Assistant

## 2023-10-08 ENCOUNTER — Encounter: Payer: Self-pay | Admitting: Physician Assistant

## 2023-10-08 VITALS — BP 102/64 | HR 73 | Ht 69.0 in | Wt 234.0 lb

## 2023-10-08 DIAGNOSIS — I428 Other cardiomyopathies: Secondary | ICD-10-CM

## 2023-10-08 DIAGNOSIS — Z5181 Encounter for therapeutic drug level monitoring: Secondary | ICD-10-CM

## 2023-10-08 DIAGNOSIS — I10 Essential (primary) hypertension: Secondary | ICD-10-CM

## 2023-10-08 DIAGNOSIS — I493 Ventricular premature depolarization: Secondary | ICD-10-CM

## 2023-10-08 DIAGNOSIS — Z79899 Other long term (current) drug therapy: Secondary | ICD-10-CM

## 2023-10-08 LAB — BASIC METABOLIC PANEL
BUN/Creatinine Ratio: 14 (ref 9–20)
BUN: 15 mg/dL (ref 6–24)
CO2: 23 mmol/L (ref 20–29)
Calcium: 9.8 mg/dL (ref 8.7–10.2)
Chloride: 104 mmol/L (ref 96–106)
Creatinine, Ser: 1.07 mg/dL (ref 0.76–1.27)
Glucose: 111 mg/dL — ABNORMAL HIGH (ref 70–99)
Potassium: 4.6 mmol/L (ref 3.5–5.2)
Sodium: 142 mmol/L (ref 134–144)
eGFR: 82 mL/min/{1.73_m2} (ref >59)

## 2023-10-08 LAB — MAGNESIUM: Magnesium: 2.2 mg/dL (ref 1.6–2.3)

## 2023-10-08 MED ORDER — MEXILETINE HCL 250 MG PO CAPS: 250.0000 mg | ORAL_CAPSULE | Freq: Two times a day (BID) | ORAL | 3 refills | Status: DC

## 2023-10-08 MED ORDER — SOTALOL HCL 160 MG PO TABS: 160.0000 mg | ORAL_TABLET | Freq: Two times a day (BID) | ORAL | 3 refills | Status: DC

## 2023-10-08 NOTE — Patient Instructions (Signed)
Medication Instructions:   Your physician recommends that you continue on your current medications as directed. Please refer to the Current Medication list given to you today.   *If you need a refill on your cardiac medications before your next appointment, please call your pharmacy*   Lab Work:    PLEASE GO DOWN STAIRS FIRST FLOOR  [address] :  BMET  AND MAG  TODAY    If you have labs (blood work) drawn today and your tests are completely normal, you will receive your results only by: MyChart Message (if you have MyChart) OR A paper copy in the mail If you have any lab test that is abnormal or we need to change your treatment, we will call you to review the results.   Testing/Procedures: NONE ORDERED  TODAY    Follow-Up: At [facility], you and your health needs are our priority.  As part of our continuing mission to provide you with exceptional heart care, we have created designated Provider Care Teams.  These Care Teams include your primary Cardiologist (physician) and Advanced Practice Providers (APPs -  Physician Assistants and Nurse Practitioners) who all work together to provide you with the care you need, when you need it.  We recommend signing up for the patient portal called "MyChart".  Sign up information is provided on this After Visit Summary.  MyChart is used to connect with patients for Virtual Visits (Telemedicine).  Patients are able to view lab/test results, encounter notes, upcoming appointments, etc.  Non-urgent messages can be sent to your provider as well.   To learn more about what you can do with MyChart, go to [email or web address].    Your next appointment:   6 month(s)  Provider:   Loman Brooklyn, MD or Francis Dowse, PA-C    Other Instructions

## 2023-10-31 ENCOUNTER — Other Ambulatory Visit: Payer: Self-pay | Admitting: Family Medicine

## 2023-10-31 DIAGNOSIS — N529 Male erectile dysfunction, unspecified: Secondary | ICD-10-CM

## 2023-11-02 NOTE — Telephone Encounter (Signed)
 06/19/2023 LOV  90tabs/1 refill

## 2023-11-11 ENCOUNTER — Other Ambulatory Visit (HOSPITAL_COMMUNITY): Payer: 59

## 2023-11-16 ENCOUNTER — Ambulatory Visit (HOSPITAL_COMMUNITY)
Admission: RE | Admit: 2023-11-16 | Discharge: 2023-11-16 | Disposition: A | Discharge status: Home / Self Care | Payer: 59 | Source: Ambulatory Visit | Attending: Internal Medicine | Admitting: Internal Medicine

## 2023-11-16 DIAGNOSIS — I428 Other cardiomyopathies: Secondary | ICD-10-CM | POA: Insufficient documentation

## 2023-11-16 LAB — ECHOCARDIOGRAM COMPLETE
AR max vel: 4.34 cm2
AV Area VTI: 4.49 cm2
AV Area mean vel: 4.14 cm2
AV Mean grad: 3 mm[Hg]
AV Peak grad: 6.1 mm[Hg]
Ao pk vel: 1.23 m/s
Area-P 1/2: 3.89 cm2
MV M vel: 0.94 m/s
MV Peak grad: 3.5 mm[Hg]
S' Lateral: 3.66 cm

## 2023-11-27 ENCOUNTER — Encounter: Payer: Self-pay | Admitting: Internal Medicine

## 2023-12-18 ENCOUNTER — Encounter: Payer: Self-pay | Admitting: Family Medicine

## 2023-12-18 ENCOUNTER — Ambulatory Visit: Payer: 59 | Admitting: Family Medicine

## 2023-12-18 VITALS — BP 122/70 | HR 59 | Temp 97.3°F | Ht 69.0 in | Wt 235.4 lb

## 2023-12-18 DIAGNOSIS — E1159 Type 2 diabetes mellitus with other circulatory complications: Secondary | ICD-10-CM

## 2023-12-18 DIAGNOSIS — I152 Hypertension secondary to endocrine disorders: Secondary | ICD-10-CM

## 2023-12-18 DIAGNOSIS — I5022 Chronic systolic (congestive) heart failure: Secondary | ICD-10-CM | POA: Diagnosis not present

## 2023-12-18 DIAGNOSIS — I428 Other cardiomyopathies: Secondary | ICD-10-CM | POA: Diagnosis not present

## 2023-12-18 DIAGNOSIS — Z23 Encounter for immunization: Secondary | ICD-10-CM

## 2023-12-18 DIAGNOSIS — E1169 Type 2 diabetes mellitus with other specified complication: Secondary | ICD-10-CM

## 2023-12-18 DIAGNOSIS — E119 Type 2 diabetes mellitus without complications: Secondary | ICD-10-CM

## 2023-12-18 DIAGNOSIS — E785 Hyperlipidemia, unspecified: Secondary | ICD-10-CM

## 2023-12-18 DIAGNOSIS — F4323 Adjustment disorder with mixed anxiety and depressed mood: Secondary | ICD-10-CM

## 2023-12-18 LAB — POCT GLYCOSYLATED HEMOGLOBIN (HGB A1C): Hemoglobin A1C: 5.5 % (ref 4.0–5.6)

## 2023-12-18 MED ORDER — DAPAGLIFLOZIN PROPANEDIOL 10 MG PO TABS: 10.0000 mg | ORAL_TABLET | Freq: Every day | ORAL | 3 refills | Status: AC

## 2023-12-18 MED ORDER — BUPROPION HCL ER (XL) 150 MG PO TB24: 150.0000 mg | ORAL_TABLET | Freq: Every day | ORAL | 3 refills | Status: AC

## 2023-12-18 NOTE — Patient Instructions (Addendum)
 Please return in 6 months for your annual complete physical; please come fasting.    If you have any questions or concerns, please don't hesitate to send me a message via MyChart or call the office at [phone]. Thank you for visiting with [place] today! It's our pleasure caring for you.   VISIT SUMMARY:  Gary Berry, a 53 year old male, came in for a routine follow-up. His medications are stable, and he has no significant changes in his health status. He has a lingering cold but no other symptoms. His diabetes is well-controlled, and his recent echocardiogram showed near-normal heart function. His cholesterol medication was recently increased, and he is due for a pneumococcal vaccination.  YOUR PLAN:  -HYPERLIPIDEMIA: Hyperlipidemia means having high levels of fats (lipids) in the blood, such as cholesterol. Your cholesterol levels are being managed with Crestor, which was recently increased to 40 mg daily. Please continue taking your medication as prescribed. We will recheck your levels at your next physical.  -DIABETES MELLITUS: Diabetes Mellitus is a condition where the body has difficulty regulating blood sugar levels. Your diabetes is well-controlled with a recent A1c of 5.5%. Please continue with your current management plan. Marcelline Deist)  -CARDIAC FUNCTION: Your recent echocardiogram showed that your heart function is close to normal. This means your heart is pumping blood effectively. Please continue with your current cardiac management.  -PNEUMOCOCCAL VACCINATION: Pneumococcal vaccination helps protect against infections caused by the pneumococcus bacteria. You are due for the Prevnar 20 vaccine, which will be administered today.  -GENERAL HEALTH MAINTENANCE: For your general health, it is important to have regular check-ups and routine labs. Your recent studies were normal. Please schedule your next physical in 6 months (August 2025).  INSTRUCTIONS:  Please schedule your next physical in  6 months (August 2025).

## 2023-12-18 NOTE — Progress Notes (Signed)
 Subjective  CC:  Chief Complaint  Patient presents with   Medical Management of Chronic Issues   Diabetes    HPI: Gary Berry is a 53 y.o. male who presents to the office today for follow up of diabetes and problems listed above in the chief complaint.  Discussed the use of AI scribe software for clinical note transcription with the patient, who gave verbal consent to proceed.  History of Present Illness   Gary Berry is a 53 year old male who presents for routine follow-up dm, hld, htn and chf and anxiety.  He reports that his medications are stable with no significant changes in his health status. He has had a lingering cold for a couple of weeks but does not report any other symptoms.  His diabetes is well-controlled with a recent A1c of 5.5%. No issues with his feet or vision, and his blood pressure is stable. Farxiga 10 daily. No retinopathy. On entresto and statin. Eligible for prevnar 20  He is currently taking Crestor, which was recently increased from 20 mg to 40 mg to manage his cholesterol levels.  LDL was very near goal on 20 so now should be well controlled. No AE.   Anxiety meds are working well. No mood or stress sxs. Tolerating well. He also takes Wellbutrin, which was recently refilled.  Reviewed cardiology notes; recent echo reviewed and normalized! Tolerating antiarrhythmic and remains asymptomatic.      Wt Readings from Last 3 Encounters:  12/18/23 235 lb 6.4 oz (106.8 kg)  10/08/23 234 lb (106.1 kg)  07/10/23 227 lb 15.3 oz (103.4 kg)    BP Readings from Last 3 Encounters:  12/18/23 122/70  10/08/23 102/64  07/10/23 114/63    Assessment  1. Type 2 diabetes mellitus without complication, without long-term current use of insulin (HCC)   2. Nonischemic cardiomyopathy (HCC)   3. Hypertension associated with diabetes (HCC)   4. Chronic systolic congestive heart failure, NYHA class 1 (HCC)   5. Adjustment reaction with anxiety and depression   6.  Hyperlipidemia associated with type 2 diabetes mellitus (HCC)      Plan  Assessment and Plan    Hyperlipidemia Stable on Crestor 40mg  daily. -Continue current medication regimen.  Diabetes Mellitus Well-controlled with an A1c of 5.5. -Continue current management.  Cardiac Function Recent echocardiogram showed near-normal function. -Continue current cardiac management.  Pneumococcal Vaccination Due for Prevnar 20. -Administer Prevnar 20 today.  General Health Maintenance / Followup Plans -Schedule physical in 6 months (August 2025).     Orders Placed This Encounter  Procedures   POCT HgB A1C   Meds ordered this encounter  Medications   buPROPion (WELLBUTRIN XL) 150 MG 24 hr tablet    Sig: Take 1 tablet (150 mg total) by mouth daily.    Dispense:  90 tablet    Refill:  3   dapagliflozin propanediol (FARXIGA) 10 MG TABS tablet    Sig: Take 1 tablet (10 mg total) by mouth daily.    Dispense:  90 tablet    Refill:  3      Immunization History  Administered Date(s) Administered   Influenza Inj Mdck Quad Pf 08/01/2017   Influenza Inj Mdck Quad With Preservative 08/02/2021   Influenza, Seasonal, Injecte, Preservative Fre 08/01/2017, 06/19/2023   Influenza,inj,Quad PF,6+ Mos 12/06/2015, 11/21/2016, 08/22/2018, 07/19/2020, 08/09/2022   Influenza-Unspecified 08/22/2018, 08/01/2019   Moderna Covid-19 Fall Seasonal Vaccine 29yrs & older 07/31/2023   Moderna Covid-19 Vaccine Bivalent Booster 53yrs & up 08/09/2022  PFIZER Comirnaty(Gray Top)Covid-19 Tri-Sucrose Vaccine 02/08/2021   PFIZER(Purple Top)SARS-COV-2 Vaccination 12/30/2019, 01/20/2020, 08/18/2020   Pfizer Covid-19 Vaccine Bivalent Booster 62yrs & up 08/02/2021   Pneumococcal Polysaccharide-23 05/25/2017   Tdap 12/18/2011, 05/28/2022   Zoster Recombinant(Shingrix) 09/28/2020, 12/27/2020    Diabetes Related Lab Review: Lab Results  Component Value Date   HGBA1C 5.7 06/19/2023   HGBA1C 5.3 11/28/2022   HGBA1C  6.0 05/28/2022    Lab Results  Component Value Date   MICROALBUR 1.3 06/19/2023   Lab Results  Component Value Date   CREATININE 1.07 10/08/2023   BUN 15 10/08/2023   NA 142 10/08/2023   K 4.6 10/08/2023   CL 104 10/08/2023   CO2 23 10/08/2023   Lab Results  Component Value Date   CHOL 148 06/19/2023   CHOL 171 05/28/2022   CHOL 159 04/01/2021   Lab Results  Component Value Date   HDL 47.10 06/19/2023   HDL 45.40 05/28/2022   HDL 44.90 04/01/2021   Lab Results  Component Value Date   LDLCALC 74 06/19/2023   LDLCALC 86 04/01/2021   LDLCALC 73 07/19/2020   Lab Results  Component Value Date   TRIG 133.0 06/19/2023   TRIG 269.0 (H) 05/28/2022   TRIG 139.0 04/01/2021   Lab Results  Component Value Date   CHOLHDL 3 06/19/2023   CHOLHDL 4 05/28/2022   CHOLHDL 4 04/01/2021   Lab Results  Component Value Date   LDLDIRECT 75.0 05/28/2022   LDLDIRECT 69.0 08/02/2019   The 10-year ASCVD risk score (Arnett DK, et al., 2019) is: 8.5%   Values used to calculate the score:     Age: 43 years     Sex: Male     Is Non-Hispanic African American: No     Diabetic: Yes     Tobacco smoker: No     Systolic Blood Pressure: 122 mmHg     Is BP treated: Yes     HDL Cholesterol: 47.1 mg/dL     Total Cholesterol: 148 mg/dL I have reviewed the PMH, Fam and Soc history. Patient Active Problem List   Diagnosis Date Noted Date Diagnosed   OSA on CPAP 06/19/2023     Priority: High    [place] [facility] manages    Nonischemic cardiomyopathy (HCC) 05/28/2022     Priority: High   PVC (premature ventricular contraction) 02/14/2021     Priority: High   Frequent PVCs 05/04/2020     Priority: High   Systolic congestive heart failure, NYHA class 1 (HCC) 04/24/2020     Priority: High    Echocardiogram 04/2020: Ef 35-40%, global hypokinesis ECHO 2023: EF improved to 50% ECHO 10/2022: results not reported.     Type 2 diabetes mellitus without complication, without long-term current  use of insulin (HCC) 11/21/2016     Priority: High   Hypertension associated with diabetes (HCC) 11/21/2016     Priority: High   Hyperlipidemia associated with type 2 diabetes mellitus (HCC) 11/21/2016     Priority: High    Crestor 20 increased to 40 05/2023 to get to goal LDL < 70    Adjustment reaction with anxiety and depression 05/28/2022     Priority: Medium     Responded to wellbutrin 2023    Obesity, Class II, BMI 35-39.9 11/21/2016     Priority: Medium    Umbilical hernia 06/23/2014     Priority: Medium    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     Priority: Low  Social History: Patient  reports that he has never smoked. He quit smokeless tobacco use about 26 years ago.  His smokeless tobacco use included chew. He reports current alcohol use of about 3.0 standard drinks of alcohol per week. He reports that he does not use drugs.  Review of Systems: Ophthalmic: negative for eye pain, loss of vision or double vision Cardiovascular: negative for chest pain Respiratory: negative for SOB or persistent cough Gastrointestinal: negative for abdominal pain Genitourinary: negative for dysuria or gross hematuria MSK: negative for foot lesions Neurologic: negative for weakness or gait disturbance  Objective  Vitals: BP 122/70   Pulse (!) 59   Temp (!) 97.3 F (36.3 C)   Ht 5\' 9"  (1.753 m)   Wt 235 lb 6.4 oz (106.8 kg)   SpO2 96%   BMI 34.76 kg/m  General: well appearing, no acute distress  Psych:  Alert and oriented, normal mood and affect HEENT:  Normocephalic, atraumatic, moist mucous membranes, supple neck  Cardiovascular:  Nl S1 and S2, RRR without murmur, gallop or rub. no edema Respiratory:  Good breath sounds bilaterally, CTAB with normal effort, no rales  Reviewed echo 10/2023: normal EF, 55-60% Diabetic education: ongoing education regarding chronic disease management for diabetes was given today. We continue to reinforce the ABC's of diabetic  management: A1c (<7 or 8 dependent upon patient), tight blood pressure control, and cholesterol management with goal LDL < 100 minimally. We discuss diet strategies, exercise recommendations, medication options and possible side effects. At each visit, we review recommended immunizations and preventive care recommendations for diabetics and stress that good diabetic control can prevent other problems. See below for this patient's data.   Commons side effects, risks, benefits, and alternatives for medications and treatment plan prescribed today were discussed, and the patient expressed understanding of the given instructions. Patient is instructed to call or message via MyChart if he/she has any questions or concerns regarding our treatment plan. No barriers to understanding were identified. We discussed Red Flag symptoms and signs in detail. Patient expressed understanding regarding what to do in case of urgent or emergency type symptoms.  Medication list was reconciled, printed and provided to the patient in AVS. Patient instructions and summary information was reviewed with the patient as documented in the AVS. This note was prepared with assistance of Dragon voice recognition software. Occasional wrong-word or sound-a-like substitutions may have occurred due to the inherent limitations of voice recognition software

## 2024-01-03 ENCOUNTER — Other Ambulatory Visit: Payer: Self-pay | Admitting: Internal Medicine

## 2024-02-05 ENCOUNTER — Other Ambulatory Visit: Payer: Self-pay | Admitting: Internal Medicine

## 2024-03-01 ENCOUNTER — Ambulatory Visit (INDEPENDENT_AMBULATORY_CARE_PROVIDER_SITE_OTHER): Admitting: Internal Medicine

## 2024-03-01 ENCOUNTER — Encounter (HOSPITAL_BASED_OUTPATIENT_CLINIC_OR_DEPARTMENT_OTHER): Payer: Self-pay | Admitting: Internal Medicine

## 2024-03-01 VITALS — BP 110/72 | HR 77 | Ht 69.0 in | Wt 237.6 lb

## 2024-03-01 DIAGNOSIS — I5022 Chronic systolic (congestive) heart failure: Secondary | ICD-10-CM

## 2024-03-01 DIAGNOSIS — I493 Ventricular premature depolarization: Secondary | ICD-10-CM | POA: Diagnosis not present

## 2024-03-01 DIAGNOSIS — I10 Essential (primary) hypertension: Secondary | ICD-10-CM | POA: Diagnosis not present

## 2024-03-01 NOTE — Patient Instructions (Signed)
 Medication Instructions:  Your physician recommends that you continue on your current medications as directed. Please refer to the Current Medication list given to you today.  *If you need a refill on your cardiac medications before your next appointment, please call your pharmacy*  Follow-Up: At [facility], you and your health needs are our priority.  As part of our continuing mission to provide you with exceptional heart care, our providers are all part of one team.  This team includes your primary Cardiologist (physician) and Advanced Practice Providers or APPs (Physician Assistants and Nurse Practitioners) who all work together to provide you with the care you need, when you need it.  Your next appointment:   In 1 year with Dr. Maximo Spar

## 2024-03-01 NOTE — Progress Notes (Signed)
 OFFICE CONSULT NOTE  Chief Complaint:  Follow-up heart failure  Primary Care Physician: Luevenia Saha, MD  HPI:  Gary Berry is a 53 y.o. male who is being seen today for the evaluation of heart failure at the request of Luevenia Saha, MD.  This is a pleasant 54 year old male whose wife is at [facility] health physician.  He was referred for evaluation of newly recognized systolic congestive heart failure.  Apparently he had been having some palpitations which were evaluated by his PCP.  An echocardiogram was ordered as well as a Holter monitor.  He was found to have some PVCs on and off his EKG.  The echocardiogram was performed on 04/19/2020 and indicated an LVEF of 35 to 40% with global hypokinesis and moderate LVH, grade 1 diastolic dysfunction and moderate RV systolic dysfunction.  Pressure and IVC were normal with moderate left atrial enlargement.  Despite these findings, Mr. Gary Berry has no heart failure symptoms.  He denies worsening shortness of breath, orthopnea, lower extremity edema, chest pain, presyncope, syncopal episodes or any other associated symptoms.  He has had palpitations as mentioned and was noted to have PVCs.  The Holter monitor result is not yet available to determine his burden of PVCs.  According to his wife he also has a history of some alcohol use and was drinking about 3-4 drinks a night which had picked up since the beginning of the Covid pandemic.  Apparently before that it was much less.  He has recently decreased this.  In addition he was described as a very heavy caffeine user and recently has been decreasing that as well.  There is heart disease in his family including his father who had heart disease in his 64s.  He also has type 2 diabetes without insulin use, hypertension and mild obesity.  Recent hemoglobin A1c was 5.3.  Direct LDL in October was 69, however triglycerides were elevated at 273.  06/28/2020  Mr. Gary Berry returns today for follow-up.  Overall he  continues to feel well.  He is generally unaware of his heart failure or PVCs.  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  There is also a right bundle branch block which has been stable even prior to starting flecainide .  QTc is 431 ms.  Repeat lab work on Entresto  showed normal creatinine and blood pressure is excellent at 118/78.  09/27/2020  Mr. Gary Berry returns today for follow-up. Overall he is continuing to feel well. In fact he says he is never felt unwell. He saw Dr. Lawana Pray who put him on mexiletine. He seems to be tolerating this and has had improvement in his PVCs. I did not detect any on his EKG today. He had an echo earlier this morning which had not been formally read. My review suggest that there is improvement in LV function possibly up to 45 to 50%. The suggest that overall he seems to be responding to therapy. I think there is still blood pressure room to consider up titrating his Entresto .  04/09/2021  [name] is seen today in follow-up.  He underwent PVC ablation in April 2022 after minimal improvement on antiarrhythmic therapy with mexiletine.  This was partially successful however the focus of his PVCs was very near the right coronary artery ostium.  This limited the extent of ablation possible.  Subsequently he has  had persistent and significant PVCs.  He was placed on sotalol  and increased up to 160 mg twice daily.  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  Previous echo in December showed some improvement in LVEF up to 40 to 45%.  Blood pressure is low today however and will not allow further up titration of his Entresto .  He is on Victoza  however has a low A1c of 5.4.  It might be reasonable to consider switching him from Victoza  to an SGLT2 inhibitor such as Jardiance or  Farxiga .  09/19/2021  Mr. Gary Berry returns today for follow-up of heart failure.  Unfortunately his LVEF has not significantly improved.  Seems to be hanging around 40 to 45%.  He just saw Dr. Lawana Pray a few days ago and was noted to be in sinus rhythm.  He a monitor showed his PVC burden was low at 5% on combination sotalol  and mexiletine.  He is also on Entresto  at a moderate dose, limited by blood pressure, also Farxiga  5 mg daily.  He endorses NYHA class I symptoms.  Overall he feels like he is doing well.  At this point Dr. Lawana Pray did not feel that his PVCs were still contributing to cardiomyopathy.  11/10/2022  Mr. Gary Berry is seen today for follow-up.  He had an echocardiogram last year which showed some further improvement in LVEF up to 45 to 50% although it has not normalized.  He is unaware of any PVCs however we feel that his PVC burden has come down.  He did have a repeat monitor that showed a burden of 5% on a combination of mexiletine and sotalol .  He also is on Entresto  and Farxiga  and on max tolerated doses based on blood pressure.  He continues to have NYHA class I symptoms.  He reports he does not exercise very much.  He was found to have very mild sleep apnea and uses CPAP for this but does not notice any change in his symptomatology.  03/01/2024  Mr. Gary Berry is seen today in follow-up.  He continues to do well.  In January his LVEF had normalized to 55 to 60%.  He continues to have NYHA class I symptoms.  He is unaware of any PVCs.  Monitoring in the past is failed to show any recent PVCs on combination therapy with mexiletine and sotalol .  He is followed closely by cardiac EP who saw him back in December.  Today however he had 3 PVCs on his EKG.  He says he is asymptomatic with this.  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   Hyperlipidemia    Hyperlipidemia associated with type 2 diabetes mellitus (HCC) 11/21/2016   Hypertension associated with diabetes (HCC) 11/21/2016   Obesity, Class II, BMI 35-39.9 11/21/2016   Sleep apnea    uses CPAP nightly   Systolic congestive heart failure with reduced left ventricular function, NYHA class 2 (HCC) 04/24/2020   Echocardiogram 04/2020:  Ef 35-40%, global hypokinesis   Type 2 diabetes mellitus without complication, without long-term current use of insulin (HCC) 11/21/2016    Past Surgical History:  Procedure Laterality Date   CYST EXCISION Right 07/10/2023   Procedure: RIGHT LONG FINGER EXCISION MUCOID CYST AND DEBRIDEMENT DISTAL INTERPHALANGEAL JOINT;  Surgeon: Brunilda Capra, MD;  Location: [facility];  Service: Orthopedics;  Laterality: Right;  45 MIN   HERNIA REPAIR  2016   hard to wake up after sx   PVC ABLATION N/A 02/14/2021   Procedure: PVC ABLATION;  Surgeon: Lei Pump, MD;  Location: [facility] INVASIVE CV LAB;  Service: Cardiovascular;  Laterality: N/A;   RIGHT/LEFT HEART CATH AND CORONARY ANGIOGRAPHY N/A 05/04/2020   Procedure: RIGHT/LEFT HEART CATH AND CORONARY ANGIOGRAPHY;  Surgeon: Arleen Lacer, MD;  [facility] CV LAB;  Service: Cardiovascular;  Laterality: N/A;   TONSILLECTOMY  1975   VASECTOMY      FAMHx:  Family History  Problem Relation Age of Onset   Diabetes Mother    Hypertension Mother    Arthritis Mother    Atrial fibrillation Father    Colon polyps Father    Sleep apnea Father    Heart disease Father    Colon cancer Neg Hx    Esophageal cancer Neg Hx    Rectal cancer Neg Hx    Stomach cancer Neg Hx     SOCHx:   reports that he has never smoked. He quit smokeless tobacco use about 26 years ago.  His smokeless tobacco use included chew. He reports current alcohol use of about 3.0 standard drinks of alcohol per week. He reports that he does not use drugs.  ALLERGIES:  No  Known Allergies  ROS: Pertinent items noted in HPI and remainder of comprehensive ROS otherwise negative.  HOME MEDS: Current Outpatient Medications on File Prior to Visit  Medication Sig Dispense Refill   buPROPion  (WELLBUTRIN  XL) 150 MG 24 hr tablet Take 1 tablet (150 mg total) by mouth daily. 90 tablet 3   cetirizine (ZYRTEC) 10 MG tablet Take 10 mg by mouth daily as needed for allergies.     dapagliflozin  propanediol (FARXIGA ) 10 MG TABS tablet Take 1 tablet (10 mg total) by mouth daily. 90 tablet 3   ENTRESTO  49-51 MG TAKE 1 TABLET BY MOUTH TWICE A DAY 60 tablet 0   ibuprofen  (ADVIL ) 200 MG tablet Take 400 mg by mouth every 6 (six) hours as needed for headache or moderate pain.     mexiletine (MEXITIL) 250 MG capsule Take 1 capsule (250 mg total) by mouth 2 (two) times daily. 180 capsule 3   naproxen  sodium (ALEVE ) 220 MG tablet Take 220 mg by mouth daily as needed (pain).     rosuvastatin  (CRESTOR ) 40 MG tablet Take 1 tablet (40 mg total) by mouth daily. 90 tablet 3   sotalol  (BETAPACE ) 160 MG tablet Take 1 tablet (160 mg total) by mouth 2 (two) times daily. 180 tablet 3   tadalafil  (CIALIS ) 5 MG tablet TAKE 1 TABLET BY MOUTH DAILY AS NEEDED FOR ERECTILE DYSFUNCTION. 90 tablet 1   No current facility-administered medications on file prior to visit.    LABS/IMAGING: No results found for this or any previous visit (from the past 48 hours).  No results found.  LIPID PANEL:    Component Value Date/Time   CHOL 148 06/19/2023 0919   TRIG 133.0 06/19/2023 0919   HDL 47.10 06/19/2023 0919   CHOLHDL 3 06/19/2023 0919   VLDL 26.6 06/19/2023 0919   LDLCALC 74 06/19/2023 0919   LDLCALC 73 07/19/2020 0905   LDLDIRECT 75.0 05/28/2022 0946    WEIGHTS: Wt Readings from Last 3 Encounters:  03/01/24 237 lb 9.6 oz (107.8 kg)  12/18/23 235 lb 6.4 oz (106.8 kg)  10/08/23 234 lb (106.1 kg)  VITALS: BP 110/72   Pulse 77   Ht 5\' 9"  (1.753 m)   Wt 237 lb 9.6 oz (107.8 kg)   SpO2  99%   BMI 35.09 kg/m   EXAM: General appearance: alert, no distress and mildly obese Neck: no carotid bruit, no JVD and thyroid not enlarged, symmetric, no tenderness/mass/nodules Lungs: clear to auscultation bilaterally Heart: regular rate and rhythm, S1, S2 normal, no murmur, click, rub or gallop Abdomen: soft, non-tender; bowel sounds normal; no masses,  no organomegaly Extremities: extremities normal, atraumatic, no cyanosis or edema Pulses: 2+ and symmetric Skin: Skin color, texture, turgor normal. No rashes or lesions Neurologic: Grossly normal Psych: pleasant  EKG: EKG Interpretation Date/Time:  Tuesday Mar 01 2024 08:35:58 EDT Ventricular Rate:  72 PR Interval:  174 QRS Duration:  146 QT Interval:  422 QTC Calculation: 462 R Axis:   -25  Text Interpretation: Sinus rhythm with frequent Premature ventricular complexes Right bundle branch block When compared with ECG of 08-Oct-2023 08:28, Premature ventricular complexes are now Present Left posterior fascicular block is no longer Present Confirmed by Dinah Franco [phone]) on 03/01/2024 8:42:47 AM   ASSESSMENT: Acute systolic heart failure, LVEF 45-50% (NYHA class I symptoms) - 02/2022, LVEF improved to 55-60% (10/2023) 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   Mr. Feige has had normalization of his LVEF on echo.  He had some PVCs today on his EKG but is already on mexiletine and sotalol .  He should have follow-up with EP in 4 to 5 months from now.  Blood pressure is well-controlled.  He is on moderate dose Entresto  and Farxiga .  No changes to his medicines today.  Plan follow-up with me or APP for heart failure annually or sooner as necessary.  Hazle Lites, MD, [facility], FNLA, FACP  [place]  [facility]  Medical  Director of the Advanced Lipid Disorders &  Cardiovascular Risk Reduction Clinic Diplomate of the American Board of Clinical Lipidology Attending Cardiologist  Direct Dial: [phone]  Fax: [phone]  Website:  [email or web address] 03/01/2024, 8:42 AM

## 2024-03-02 ENCOUNTER — Other Ambulatory Visit: Payer: Self-pay | Admitting: Internal Medicine

## 2024-03-07 ENCOUNTER — Encounter: Payer: Self-pay | Admitting: *Deleted

## 2024-03-07 NOTE — Progress Notes (Signed)
 PATIENT: Gary Berry DOB: 1971-06-15  REASON FOR VISIT: follow up HISTORY FROM: patient PRIMARY NEUROLOGIST: Dr. Omar Bibber  Virtual Visit via Video Note  I connected with Gary Berry on 03/08/24  at  2:45 PM EDT  by a video enabled telemedicine application located remotely at [facility] and verified that I am speaking with the correct person using two identifiers who was located at their own home in [place].    I discussed the limitations of evaluation and management by telemedicine and the availability of in person appointments. The patient expressed understanding and agreed to proceed.   PATIENT: Gary Berry DOB: 02-16-1971  REASON FOR VISIT: follow up HISTORY FROM: patient  HISTORY OF PRESENT ILLNESS: Today 03/08/24   Anival Pasha is a 53 y.o. male with a history of obstructive sleep apnea on CPAP. Returns today for follow-up.  Reports that CPAP is working well.  He states that he is not sure he can tell the benefit but he does use it consistently.  Tries to change out his supplies regularly.  Denies any new medical history.  Denies any changes with his medication.  Download is below       03/10/23: [name] is a 53 y.o. male with a history of OSA on CPAP. Returns today for follow-up.  He reports that the CPAP is working well for him.  He denies any new issues.  Reports that he changes out his supplies regularly.  Download is below       REVIEW OF SYSTEMS: Out of a complete 14 system review of symptoms, the patient complains only of the following symptoms, and all other reviewed systems are negative.  ALLERGIES: No Known Allergies  HOME MEDICATIONS: Outpatient Medications Prior to Visit  Medication Sig Dispense Refill   buPROPion  (WELLBUTRIN  XL) 150 MG 24 hr tablet Take 1 tablet (150 mg total) by mouth daily. 90 tablet 3   cetirizine (ZYRTEC) 10 MG tablet Take 10 mg by mouth daily as needed for allergies.     dapagliflozin  propanediol (FARXIGA )  10 MG TABS tablet Take 1 tablet (10 mg total) by mouth daily. 90 tablet 3   ibuprofen  (ADVIL ) 200 MG tablet Take 400 mg by mouth every 6 (six) hours as needed for headache or moderate pain.     mexiletine (MEXITIL) 250 MG capsule Take 1 capsule (250 mg total) by mouth 2 (two) times daily. 180 capsule 3   naproxen  sodium (ALEVE ) 220 MG tablet Take 220 mg by mouth daily as needed (pain).     rosuvastatin  (CRESTOR ) 40 MG tablet Take 1 tablet (40 mg total) by mouth daily. 90 tablet 3   sacubitril -valsartan  (ENTRESTO ) 49-51 MG TAKE 1 TABLET BY MOUTH TWICE A DAY 180 tablet 3   sotalol  (BETAPACE ) 160 MG tablet Take 1 tablet (160 mg total) by mouth 2 (two) times daily. 180 tablet 3   tadalafil  (CIALIS ) 5 MG tablet TAKE 1 TABLET BY MOUTH DAILY AS NEEDED FOR ERECTILE DYSFUNCTION. 90 tablet 1   No facility-administered medications prior to visit.    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   Hyperlipidemia    Hyperlipidemia associated with type 2 diabetes mellitus (HCC) 11/21/2016   Hypertension associated with diabetes (HCC) 11/21/2016   Obesity, Class II, BMI 35-39.9 11/21/2016  Sleep apnea    uses CPAP nightly   Systolic congestive heart failure with reduced left ventricular function, NYHA class 2 (HCC) 04/24/2020   Echocardiogram 04/2020:  Ef 35-40%, global hypokinesis   Type 2 diabetes mellitus without complication, without long-term current use of insulin (HCC) 11/21/2016    PAST SURGICAL HISTORY: Past Surgical History:  Procedure Laterality Date   CYST EXCISION Right 07/10/2023   Procedure: RIGHT LONG FINGER EXCISION MUCOID CYST AND DEBRIDEMENT DISTAL INTERPHALANGEAL JOINT;  Surgeon: Brunilda Capra, MD;  Location: [facility];  Service: Orthopedics;  Laterality: Right;  45 MIN    HERNIA REPAIR  2016   hard to wake up after sx   PVC ABLATION N/A 02/14/2021   Procedure: PVC ABLATION;  Surgeon: Lei Pump, MD;  Location: [facility] INVASIVE CV LAB;  Service: Cardiovascular;  Laterality: N/A;   RIGHT/LEFT HEART CATH AND CORONARY ANGIOGRAPHY N/A 05/04/2020   Procedure: RIGHT/LEFT HEART CATH AND CORONARY ANGIOGRAPHY;  Surgeon: Arleen Lacer, MD;  Location: [facility] INVASIVE CV LAB;  Service: Cardiovascular;  Laterality: N/A;   TONSILLECTOMY  1975   VASECTOMY      FAMILY HISTORY: Family History  Problem Relation Age of Onset   Diabetes Mother    Hypertension Mother    Arthritis Mother    Atrial fibrillation Father    Colon polyps Father    Sleep apnea Father    Heart disease Father    Colon cancer Neg Hx    Esophageal cancer Neg Hx    Rectal cancer Neg Hx    Stomach cancer Neg Hx     SOCIAL HISTORY: Social History   Socioeconomic History   Marital status: Married    Spouse name: Jasmine Mesi, MD   Number of children: 1   Years of education: BA   Highest education level: Bachelor's degree (e.g., BA, AB, BS)  Occupational History   Occupation: [employer]    Employer: [employer]  Tobacco Use   Smoking status: Never   Smokeless tobacco: Former    Types: Chew    Quit date: 11/21/1997  Vaping Use   Vaping status: Never Used  Substance and Sexual Activity   Alcohol use: Yes    Alcohol/week: 3.0 standard drinks of alcohol    Types: 1 Glasses of wine, 2 Cans of beer per week    Comment: 3-4 week   Drug use: No   Sexual activity: Yes    Partners: Female    Birth control/protection: Surgical  Other Topics Concern   Not on file  Social History Narrative   Caffeine use: 2 cups coffee per day   Caffeine free soda      Right handed    Social Drivers of Health   Financial Resource Strain: Low Risk  (12/17/2023)   Overall Financial Resource Strain (CARDIA)    Difficulty of Paying Living Expenses: Not hard at all  Food Insecurity: No Food Insecurity (12/17/2023)    Hunger Vital Sign    Worried About Running Out of Food in the Last Year: Never true    Ran Out of Food in the Last Year: Never true  Transportation Needs: No Transportation Needs (12/17/2023)   PRAPARE - Administrator, Civil Service (Medical): No    Lack of Transportation (Non-Medical): No  Physical Activity: Insufficiently Active (12/17/2023)   Exercise Vital Sign    Days of Exercise per Week: 2 days    Minutes of Exercise per Session: 20 min  Stress: No Stress Concern Present (  12/17/2023)   [employer] of Occupational Health - Occupational Stress Questionnaire    Feeling of Stress : Not at all  Social Connections: Moderately Integrated (12/17/2023)   Social Connection and Isolation Panel [NHANES]    Frequency of Communication with Friends and Family: More than three times a week    Frequency of Social Gatherings with Friends and Family: Once a week    Attends Religious Services: 1 to 4 times per year    Active Member of [employer] or Organizations: No    Attends Engineer, structural: Not on file    Marital Status: Married  Catering manager Violence: Not on file      PHYSICAL EXAM Generalized: Well developed, in no acute distress   Neurological examination  Mentation: Alert oriented to time, place, history taking. Follows all commands speech and language fluent Cranial nerve II-XII: Facial symmetry noted DIAGNOSTIC DATA (LABS, IMAGING, TESTING) - I reviewed patient records, labs, notes, testing and imaging myself where available.  Lab Results  Component Value Date   WBC 6.2 06/19/2023   HGB 16.7 06/19/2023   HCT 51.5 06/19/2023   MCV 97.2 06/19/2023   PLT 228.0 06/19/2023      Component Value Date/Time   NA 142 10/08/2023 0913   K 4.6 10/08/2023 0913   CL 104 10/08/2023 0913   CO2 23 10/08/2023 0913   GLUCOSE 111 (H) 10/08/2023 0913   GLUCOSE 141 (H) 06/19/2023 0919   BUN 15 10/08/2023 0913   CREATININE 1.07 10/08/2023 0913   CALCIUM  9.8  10/08/2023 0913   PROT 7.0 06/19/2023 0919   ALBUMIN 4.4 06/19/2023 0919   AST 20 06/19/2023 0919   ALT 15 06/19/2023 0919   ALKPHOS 65 06/19/2023 0919   BILITOT 0.5 06/19/2023 0919   GFRNONAA >60 02/15/2021 0400   GFRAA 97 11/16/2020 0939   Lab Results  Component Value Date   CHOL 148 06/19/2023   HDL 47.10 06/19/2023   LDLCALC 74 06/19/2023   LDLDIRECT 75.0 05/28/2022   TRIG 133.0 06/19/2023   CHOLHDL 3 06/19/2023   Lab Results  Component Value Date   HGBA1C 5.5 12/18/2023   Lab Results  Component Value Date   VITAMINB12 748 02/21/2019   Lab Results  Component Value Date   TSH 1.18 06/19/2023      ASSESSMENT AND PLAN 53 y.o. year old male  has a past medical history of Anxiety, Chronic right shoulder pain, requiring injection prn (03/20/2017), Depression, Dysrhythmia, ED (erectile dysfunction) of organic origin (11/21/2016), Hyperlipidemia, Hyperlipidemia associated with type 2 diabetes mellitus (HCC) (11/21/2016), Hypertension associated with diabetes (HCC) (11/21/2016), Obesity, Class II, BMI 35-39.9 (11/21/2016), Sleep apnea, Systolic congestive heart failure with reduced left ventricular function, NYHA class 2 (HCC) (04/24/2020), and Type 2 diabetes mellitus without complication, without long-term current use of insulin (HCC) (11/21/2016). here with:  OSA on CPAP  CPAP compliance excellent Residual AHI is good Encouraged patient to continue using CPAP nightly and > 4 hours each night F/U in 1 year or sooner if needed   Clem Currier, MSN, NP-C 03/07/2024, 3:36 PM [facility] [address], [address] [place], [place] [address] [phone]

## 2024-03-08 ENCOUNTER — Telehealth (INDEPENDENT_AMBULATORY_CARE_PROVIDER_SITE_OTHER): Payer: 59 | Admitting: Adult Health

## 2024-03-08 DIAGNOSIS — G4733 Obstructive sleep apnea (adult) (pediatric): Secondary | ICD-10-CM

## 2024-03-08 NOTE — Telephone Encounter (Signed)
 Noted

## 2024-03-08 NOTE — Patient Instructions (Signed)
 Continue using CPAP nightly and greater than 4 hours each night If your symptoms worsen or you develop new symptoms please let us know.

## 2024-04-04 ENCOUNTER — Encounter: Payer: Self-pay | Admitting: Family Medicine

## 2024-04-04 ENCOUNTER — Telehealth: Admitting: Family Medicine

## 2024-04-04 DIAGNOSIS — L089 Local infection of the skin and subcutaneous tissue, unspecified: Secondary | ICD-10-CM | POA: Diagnosis not present

## 2024-04-04 MED ORDER — SULFAMETHOXAZOLE-TRIMETHOPRIM 800-160 MG PO TABS: 1.0000 | ORAL_TABLET | Freq: Two times a day (BID) | ORAL | 0 refills | Status: AC

## 2024-04-04 NOTE — Progress Notes (Signed)
E Visit for Cellulitis  We are sorry that you are not feeling well. Here is how we plan to help!  Based on what you shared with me it looks like you have cellulitis.  Cellulitis looks like areas of skin redness, swelling, and warmth; it develops as a result of bacteria entering under the skin. Little red spots and/or bleeding can be seen in skin, and tiny surface sacs containing fluid can occur. Fever can be present. Cellulitis is almost always on one side of a body, and the lower limbs are the most common site of involvement.   I have prescribed:  Bactrim DS 1 tablet by mouth twice a day for 7 days  HOME CARE:  Take your medications as ordered and take all of them, even if the skin irritation appears to be healing.   GET HELP RIGHT AWAY IF:  Symptoms that don't begin to go away within 48 hours. Severe redness persists or worsens If the area turns color, spreads or swells. If it blisters and opens, develops yellow-brown crust or bleeds. You develop a fever or chills. If the pain increases or becomes unbearable.  Are unable to keep fluids and food down.  MAKE SURE YOU   Understand these instructions. Will watch your condition. Will get help right away if you are not doing well or get worse.  Thank you for choosing an e-visit.  Your e-visit answers were reviewed by a board certified advanced clinical practitioner to complete your personal care plan. Depending upon the condition, your plan could have included both over the counter or prescription medications.  Please review your pharmacy choice. Make sure the pharmacy is open so you can pick up prescription now. If there is a problem, you may contact your provider through MyChart messaging and have the prescription routed to another pharmacy.  Your safety is important to us. If you have drug allergies check your prescription carefully.   For the next 24 hours you can use MyChart to ask questions about today's visit, request a  non-urgent call back, or ask for a work or school excuse. You will get an email in the next two days asking about your experience. I hope that your e-visit has been valuable and will speed your recovery.  I provided 5 minutes of non face-to-face time during this encounter for chart review, medication and order placement, as well as and documentation.   

## 2024-04-17 ENCOUNTER — Other Ambulatory Visit: Payer: Self-pay | Admitting: Cardiology

## 2024-05-25 ENCOUNTER — Other Ambulatory Visit: Payer: Self-pay | Admitting: Family Medicine

## 2024-06-24 ENCOUNTER — Ambulatory Visit: Payer: 59 | Admitting: Family Medicine

## 2024-06-24 ENCOUNTER — Encounter: Payer: Self-pay | Admitting: Family Medicine

## 2024-06-24 VITALS — BP 119/80 | HR 60 | Temp 97.7°F | Ht 69.0 in | Wt 233.2 lb

## 2024-06-24 DIAGNOSIS — E1169 Type 2 diabetes mellitus with other specified complication: Secondary | ICD-10-CM | POA: Diagnosis not present

## 2024-06-24 DIAGNOSIS — Z23 Encounter for immunization: Secondary | ICD-10-CM | POA: Diagnosis not present

## 2024-06-24 DIAGNOSIS — E119 Type 2 diabetes mellitus without complications: Secondary | ICD-10-CM | POA: Diagnosis not present

## 2024-06-24 DIAGNOSIS — Z7984 Long term (current) use of oral hypoglycemic drugs: Secondary | ICD-10-CM

## 2024-06-24 DIAGNOSIS — Z0000 Encounter for general adult medical examination without abnormal findings: Secondary | ICD-10-CM

## 2024-06-24 DIAGNOSIS — I152 Hypertension secondary to endocrine disorders: Secondary | ICD-10-CM

## 2024-06-24 DIAGNOSIS — L821 Other seborrheic keratosis: Secondary | ICD-10-CM

## 2024-06-24 DIAGNOSIS — F4323 Adjustment disorder with mixed anxiety and depressed mood: Secondary | ICD-10-CM

## 2024-06-24 DIAGNOSIS — I493 Ventricular premature depolarization: Secondary | ICD-10-CM | POA: Diagnosis not present

## 2024-06-24 DIAGNOSIS — I428 Other cardiomyopathies: Secondary | ICD-10-CM

## 2024-06-24 DIAGNOSIS — Z0001 Encounter for general adult medical examination with abnormal findings: Secondary | ICD-10-CM

## 2024-06-24 DIAGNOSIS — E1159 Type 2 diabetes mellitus with other circulatory complications: Secondary | ICD-10-CM | POA: Diagnosis not present

## 2024-06-24 DIAGNOSIS — E785 Hyperlipidemia, unspecified: Secondary | ICD-10-CM

## 2024-06-24 LAB — CBC WITH DIFFERENTIAL/PLATELET
Basophils Absolute: 0.1 K/uL (ref 0.0–0.1)
Basophils Relative: 1.2 % (ref 0.0–3.0)
Eosinophils Absolute: 0.1 K/uL (ref 0.0–0.7)
Eosinophils Relative: 2.3 % (ref 0.0–5.0)
HCT: 48.2 % (ref 39.0–52.0)
Hemoglobin: 15.9 g/dL (ref 13.0–17.0)
Lymphocytes Relative: 27.9 % (ref 12.0–46.0)
Lymphs Abs: 1.7 K/uL (ref 0.7–4.0)
MCHC: 32.9 g/dL (ref 30.0–36.0)
MCV: 93.8 fl (ref 78.0–100.0)
Monocytes Absolute: 0.5 K/uL (ref 0.1–1.0)
Monocytes Relative: 8.8 % (ref 3.0–12.0)
Neutro Abs: 3.6 K/uL (ref 1.4–7.7)
Neutrophils Relative %: 59.8 % (ref 43.0–77.0)
Platelets: 206 K/uL (ref 150.0–400.0)
RBC: 5.13 Mil/uL (ref 4.22–5.81)
RDW: 14.5 % (ref 11.5–15.5)
WBC: 5.9 K/uL (ref 4.0–10.5)

## 2024-06-24 LAB — LIPID PANEL
Cholesterol: 124 mg/dL (ref 0–200)
HDL: 45.9 mg/dL (ref >39.00)
LDL Cholesterol: 49 mg/dL (ref 0–99)
NonHDL: 78.43
Total CHOL/HDL Ratio: 3
Triglycerides: 148 mg/dL (ref 0.0–149.0)
VLDL: 29.6 mg/dL (ref 0.0–40.0)

## 2024-06-24 LAB — COMPREHENSIVE METABOLIC PANEL WITH GFR
ALT: 15 U/L (ref 0–53)
AST: 19 U/L (ref 0–37)
Albumin: 4.4 g/dL (ref 3.5–5.2)
Alkaline Phosphatase: 59 U/L (ref 39–117)
BUN: 19 mg/dL (ref 6–23)
CO2: 26 meq/L (ref 19–32)
Calcium: 9.5 mg/dL (ref 8.4–10.5)
Chloride: 103 meq/L (ref 96–112)
Creatinine, Ser: 1.03 mg/dL (ref 0.40–1.50)
GFR: 81.74 mL/min (ref >60.00)
Glucose, Bld: 106 mg/dL — ABNORMAL HIGH (ref 70–99)
Potassium: 4.7 meq/L (ref 3.5–5.1)
Sodium: 138 meq/L (ref 135–145)
Total Bilirubin: 0.5 mg/dL (ref 0.2–1.2)
Total Protein: 6.7 g/dL (ref 6.0–8.3)

## 2024-06-24 LAB — MICROALBUMIN / CREATININE URINE RATIO
Creatinine,U: 51.6 mg/dL
Microalb Creat Ratio: 17.3 mg/g (ref 0.0–30.0)
Microalb, Ur: 0.9 mg/dL (ref 0.0–1.9)

## 2024-06-24 LAB — HEMOGLOBIN A1C: Hgb A1c MFr Bld: 6.2 % (ref 4.6–6.5)

## 2024-06-24 LAB — TSH: TSH: 1.2 u[IU]/mL (ref 0.35–5.50)

## 2024-06-24 NOTE — Progress Notes (Signed)
 Subjective  Chief Complaint  Patient presents with   Annual Exam   Diabetes   Hyperlipidemia   Hypertension    HPI: Gary Berry is a 53 y.o. male who presents to [facility] [place] today for a Male Wellness Visit. He also has the concerns and/or needs as listed above in the chief complaint. These will be addressed in addition to the Health Maintenance Visit.   Wellness Visit: annual visit with health maintenance review and exam   Health maintenance: 53 year old male, colorectal cancer screening up-to-date.  Normal.  Feels well.  Weight has trended down and stabilized.  No new concerns.  Non-smoker.  Eligible for flu vaccine today  Body mass index is 34.44 kg/m. Wt Readings from Last 3 Encounters:  06/24/24 233 lb 3.2 oz (105.8 kg)  03/01/24 237 lb 9.6 oz (107.8 kg)  12/18/23 235 lb 6.4 oz (106.8 kg)   Chronic disease management visit and/or acute problem visit: Type 2 diabetes on Farxiga  very well-controlled.  Healthy diet.  No symptoms of hyperglycemia.  No complications.  Due for recheck on statin, ARB and Farxiga  10 Nonischemic cardiomyopathy and frequent PVCs managed by cardiology.  No shortness of breath, chest pain or palpitations.  Current medications are controlling these conditions well.  Seeing cardiology annually now.  Due for recheck of electrolytes and renal function. Adjustment reaction on Wellbutrin  which is very well-controlled.  Mood is stable, no significant irritability, sadness or panic.  No adverse effects Hyperlipidemia on statin due for recheck.  Has been well-controlled.  Goal LDL less than 70.  No side effects. Has brown mole on the left upper thigh.  Has been there for many years.  May be growing slightly.  Would like it checked.  No bleeding, itching or irregularities noted.  Assessment  1. Encounter for well adult exam with abnormal findings   2. Need for influenza vaccination   3. Type 2 diabetes mellitus without complication,  without long-term current use of insulin (HCC)   4. Hypertension associated with diabetes (HCC)   5. Hyperlipidemia associated with type 2 diabetes mellitus (HCC)   6. Frequent PVCs   7. Nonischemic cardiomyopathy (HCC)   8. Adjustment reaction with anxiety and depression   9. Seborrheic keratosis      Plan  Male Wellness Visit: Age appropriate Health Maintenance and Prevention measures were discussed with patient. Included topics are cancer screening recommendations, ways to keep healthy (see AVS) including dietary and exercise recommendations, regular eye and dental care, use of seat belts, and avoidance of moderate alcohol use and tobacco use.  BMI: discussed patient's BMI and encouraged positive lifestyle modifications to help get to or maintain a target BMI. HM needs and immunizations were addressed and ordered. See below for orders. See HM and immunization section for updates. Routine labs and screening tests ordered including cmp, cbc and lipids where appropriate. Discussed recommendations regarding Vit D and calcium  supplementation (see AVS)  Chronic disease f/u and/or acute problem visit: (deemed necessary to be done in addition to the wellness visit): Type 2 diabetes very well-controlled on Farxiga  10.  Recheck A1c.  No symptoms of neuropathy.  No complications.  Continue ARB, Farxiga , diabetic diet and statin.  Flu shot updated today.  Eye exam due next month. Nonischemic cardiomyopathy and PVCs: Continue current heart medicines.  Stable symptomatically and normal exam today. HLD on statin for recheck.  Check LFTs and lipids Continue Wellbutrin .  Managing mood well Reassured, seborrheic keratoses.  Benign  Follow up:  6 months for recheck diabetes Commons side effects, risks, benefits, and alternatives for medications and treatment plan prescribed today were discussed, and the patient expressed understanding of the given instructions. Patient is instructed to call or message via  MyChart if he/she has any questions or concerns regarding our treatment plan. No barriers to understanding were identified. We discussed Red Flag symptoms and signs in detail. Patient expressed understanding regarding what to do in case of urgent or emergency type symptoms.  Medication list was reconciled, printed and provided to the patient in AVS. Patient instructions and summary information was reviewed with the patient as documented in the AVS. This note was prepared with assistance of Dragon voice recognition software. Occasional wrong-word or sound-a-like substitutions may have occurred due to the inherent limitations of voice recognition software  Orders Placed This Encounter  Procedures   Flu vaccine trivalent PF, 6mos and older(Flulaval,Afluria,Fluarix,Fluzone)   CBC with Differential/Platelet   Comprehensive metabolic panel with GFR   Lipid panel   Hemoglobin A1c   TSH   Microalbumin / creatinine urine ratio   No orders of the defined types were placed in this encounter.    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   Systolic congestive heart failure, NYHA class 1 (HCC) 04/24/2020   Type 2 diabetes mellitus without complication, without long-term current use of insulin (HCC) 11/21/2016   Hypertension associated with diabetes (HCC) 11/21/2016   Hyperlipidemia associated with type 2 diabetes mellitus (HCC) 11/21/2016   Adjustment reaction with anxiety and depression 05/28/2022   Obesity, Class II, BMI 35-39.9 11/21/2016   Umbilical hernia 06/23/2014   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   Health Maintenance  Topic Date Due   Diabetic kidney evaluation - Urine ACR  Never done   Hepatitis B Vaccines 19-59 Average Risk (1 of 3 - 19+ 3-dose series) Never done   HEMOGLOBIN A1C  06/16/2024   COVID-19  Vaccine (8 - Pfizer risk 2024-25 season) 07/10/2024 (Originally 06/20/2024)   OPHTHALMOLOGY EXAM  07/20/2024   Diabetic kidney evaluation - eGFR measurement  10/07/2024   FOOT EXAM  06/24/2025   Colonoscopy  07/28/2029   DTaP/Tdap/Td (3 - Td or Tdap) 05/28/2032   Pneumococcal Vaccine: 50+ Years  Completed   Influenza Vaccine  Completed   Hepatitis C Screening  Completed   Zoster Vaccines- Shingrix  Completed   HPV VACCINES  Aged Out   Meningococcal B Vaccine  Aged Out   HIV Screening  Discontinued   Immunization History  Administered Date(s) Administered   Influenza Inj Mdck Quad Pf 08/01/2017   Influenza Inj Mdck Quad With Preservative 08/02/2021   Influenza, Seasonal, Injecte, Preservative Fre 08/01/2017, 06/19/2023, 06/24/2024   Influenza,inj,Quad PF,6+ Mos 12/06/2015, 11/21/2016, 08/22/2018, 07/19/2020, 08/09/2022   Influenza-Unspecified 08/22/2018, 08/01/2019   Moderna Covid-19 Fall Seasonal Vaccine 38yrs & older 07/31/2023   Moderna Covid-19 Vaccine Bivalent Booster 58yrs & up 08/09/2022   PFIZER Comirnaty(Gray Top)Covid-19 Tri-Sucrose Vaccine 02/08/2021   PFIZER(Purple Top)SARS-COV-2 Vaccination 12/30/2019, 01/20/2020, 08/18/2020   PNEUMOCOCCAL CONJUGATE-20 12/18/2023   Pfizer Covid-19 Vaccine Bivalent Booster 51yrs & up 08/02/2021   Pneumococcal Polysaccharide-23 05/25/2017   Tdap 12/18/2011, 05/28/2022   Zoster Recombinant(Shingrix) 09/28/2020, 12/27/2020   We updated and reviewed the patient's past history in detail and it is documented below. Allergies: Patient has no known allergies. Past Medical History  has a past  medical history of Anxiety, Chronic right shoulder pain, requiring injection prn (03/20/2017), Depression, Dysrhythmia, ED (erectile dysfunction) of organic origin (11/21/2016), Hyperlipidemia, Hyperlipidemia associated with type 2 diabetes mellitus (HCC) (11/21/2016), Hypertension associated with diabetes (HCC) (11/21/2016), Obesity, Class II, BMI 35-39.9  (11/21/2016), Sleep apnea, Systolic congestive heart failure with reduced left ventricular function, NYHA class 2 (HCC) (04/24/2020), and Type 2 diabetes mellitus without complication, without long-term current use of insulin (HCC) (11/21/2016). Past Surgical History Patient  has a past surgical history that includes Tonsillectomy (1975); Hernia repair (2016); Vasectomy; RIGHT/LEFT HEART CATH AND CORONARY ANGIOGRAPHY (N/A, 05/04/2020); PVC ABLATION (N/A, 02/14/2021); and Cyst excision (Right, 07/10/2023). Social History Patient  reports that he has never smoked. He quit smokeless tobacco use about 26 years ago.  His smokeless tobacco use included chew. He reports current alcohol use of about 3.0 standard drinks of alcohol per week. He reports that he does not use drugs. Family History family history includes Arthritis in his mother; Atrial fibrillation in his father; Colon polyps in his father; Diabetes in his mother; Heart disease in his father; Hypertension in his mother; Sleep apnea in his father. Review of Systems: Constitutional: negative for fever or malaise Ophthalmic: negative for photophobia, double vision or loss of vision Cardiovascular: negative for chest pain, dyspnea on exertion, or new LE swelling Respiratory: negative for SOB or persistent cough Gastrointestinal: negative for abdominal pain, change in bowel habits or melena Genitourinary: negative for dysuria or gross hematuria Musculoskeletal: negative for new gait disturbance or muscular weakness Integumentary: negative for new or persistent rashes Neurological: negative for TIA or stroke symptoms Psychiatric: negative for SI or delusions Allergic/Immunologic: negative for hives  Patient Care Team    Relationship Specialty Notifications Start End  Jodie Lavern CROME, MD PCP - General Family Medicine  03/31/22   Inocencio Soyla Lunger, MD PCP - Electrophysiology Cardiology  08/06/20   Mona Vinie BROCKS, MD PCP - Cardiology Cardiology   03/31/22   Murrell Drivers, MD Consulting Physician Orthopedic Surgery  06/19/23    Comment: hand surgeon  Sherryl Bouchard, NP Nurse Practitioner Neurology  06/19/23    Comment: manages CPAP   Objective  Vitals: BP 119/80   Pulse 60   Temp 97.7 F (36.5 C)   Ht 5' 9 (1.753 m)   Wt 233 lb 3.2 oz (105.8 kg)   SpO2 98%   BMI 34.44 kg/m  General:  Well developed, well nourished, no acute distress  Psych:  Alert and orientedx3,normal mood and affect HEENT:  Normocephalic, atraumatic, non-icteric sclera,  oropharynx is clear without mass or exudate, supple neck without adenopathy, or thyromegaly Cardiovascular:  Normal S1, S2, RRR without gallop, rub or murmur,  Respiratory:  Good breath sounds bilaterally, CTAB with normal respiratory effort Gastrointestinal: normal bowel sounds, soft, non-tender, no noted masses. No HSM MSK: Joints are without erythema or swelling.  Skin:  Warm, no rashes, skin left upper thigh with approximately 1.3 cm raised brown macule Neurologic:    Mental status is normal.  Gross motor and sensory exams are normal. Stable gait. No tremor

## 2024-06-24 NOTE — Patient Instructions (Signed)
 Please return in 6 months to recheck diabetes and blood pressure   I will release your lab results to you on your MyChart account with further instructions. You may see the results before I do, but when I review them I will send you a message with my report or have my assistant call you if things need to be discussed. Please reply to my message with any questions. Thank you!   If you have any questions or concerns, please don't hesitate to send me a message via MyChart or call the office at [phone]. Thank you for visiting with us  today! It's our pleasure caring for you.   Glad you are doing well.

## 2024-07-03 ENCOUNTER — Ambulatory Visit: Payer: Self-pay | Admitting: Family Medicine

## 2024-07-03 NOTE — Progress Notes (Signed)
 See mychart note Dear Gary Berry, I have reviewed your labs: things look good except your A1c is now up to 6.2 which is higher than usual for you. Your diabetes remains pretty well controlled but monitor your diet and limit sweets, sugars and processed carbohydrates All else looks good! Dr. Jodie

## 2024-08-17 LAB — OPHTHALMOLOGY REPORT-SCANNED

## 2024-11-19 ENCOUNTER — Other Ambulatory Visit: Payer: Self-pay | Admitting: Physician Assistant

## 2024-12-23 ENCOUNTER — Ambulatory Visit: Admitting: Family Medicine

## 2025-03-07 ENCOUNTER — Telehealth: Admitting: Adult Health
